# Patient Record
Sex: Female | Born: 1935 | Race: White | Hispanic: No | Smoking: Former smoker
Health system: Southern US, Community
[De-identification: ages and names within clinical notes are randomized; demographics above are authoritative.]

## PROBLEM LIST (undated history)

## (undated) DIAGNOSIS — N8189 Other female genital prolapse: Secondary | ICD-10-CM

## (undated) DIAGNOSIS — I69391 Dysphagia following cerebral infarction: Secondary | ICD-10-CM

## (undated) DIAGNOSIS — I10 Essential (primary) hypertension: Secondary | ICD-10-CM

## (undated) DIAGNOSIS — H3530 Unspecified macular degeneration: Secondary | ICD-10-CM

## (undated) DIAGNOSIS — I639 Cerebral infarction, unspecified: Secondary | ICD-10-CM

## (undated) DIAGNOSIS — R7989 Other specified abnormal findings of blood chemistry: Secondary | ICD-10-CM

## (undated) DIAGNOSIS — Z8673 Personal history of transient ischemic attack (TIA), and cerebral infarction without residual deficits: Secondary | ICD-10-CM

## (undated) DIAGNOSIS — R5381 Other malaise: Secondary | ICD-10-CM

## (undated) DIAGNOSIS — S91009A Unspecified open wound, unspecified ankle, initial encounter: Secondary | ICD-10-CM

## (undated) DIAGNOSIS — R609 Edema, unspecified: Secondary | ICD-10-CM

## (undated) DIAGNOSIS — I2510 Atherosclerotic heart disease of native coronary artery without angina pectoris: Secondary | ICD-10-CM

## (undated) DIAGNOSIS — G459 Transient cerebral ischemic attack, unspecified: Secondary | ICD-10-CM

## (undated) DIAGNOSIS — I5032 Chronic diastolic (congestive) heart failure: Secondary | ICD-10-CM

## (undated) DIAGNOSIS — F329 Major depressive disorder, single episode, unspecified: Secondary | ICD-10-CM

## (undated) DIAGNOSIS — E041 Nontoxic single thyroid nodule: Secondary | ICD-10-CM

## (undated) DIAGNOSIS — S81809A Unspecified open wound, unspecified lower leg, initial encounter: Secondary | ICD-10-CM

## (undated) DIAGNOSIS — R200 Anesthesia of skin: Secondary | ICD-10-CM

## (undated) DIAGNOSIS — I509 Heart failure, unspecified: Secondary | ICD-10-CM

## (undated) DIAGNOSIS — K589 Irritable bowel syndrome without diarrhea: Secondary | ICD-10-CM

## (undated) DIAGNOSIS — I517 Cardiomegaly: Secondary | ICD-10-CM

## (undated) DIAGNOSIS — M25519 Pain in unspecified shoulder: Secondary | ICD-10-CM

## (undated) DIAGNOSIS — F32A Depression, unspecified: Secondary | ICD-10-CM

## (undated) DIAGNOSIS — B028 Zoster with other complications: Secondary | ICD-10-CM

## (undated) DIAGNOSIS — J209 Acute bronchitis, unspecified: Secondary | ICD-10-CM

## (undated) DIAGNOSIS — K449 Diaphragmatic hernia without obstruction or gangrene: Secondary | ICD-10-CM

## (undated) DIAGNOSIS — I219 Acute myocardial infarction, unspecified: Secondary | ICD-10-CM

## (undated) DIAGNOSIS — S81009A Unspecified open wound, unspecified knee, initial encounter: Secondary | ICD-10-CM

## (undated) HISTORY — DX: Acute myocardial infarction, unspecified: I21.9

## (undated) HISTORY — DX: Unspecified macular degeneration: H35.30

## (undated) HISTORY — DX: Chronic diastolic (congestive) heart failure: I50.32

## (undated) HISTORY — DX: Heart failure, unspecified: I50.9

## (undated) HISTORY — DX: Personal history of transient ischemic attack (TIA), and cerebral infarction without residual deficits: Z86.73

## (undated) HISTORY — DX: Transient cerebral ischemic attack, unspecified: G45.9

## (undated) HISTORY — PX: CORONARY ANGIOPLASTY: SHX604

## (undated) HISTORY — DX: Irritable bowel syndrome, unspecified: K58.9

## (undated) HISTORY — DX: Pain in unspecified shoulder: M25.519

## (undated) HISTORY — PX: CARDIAC CATHETERIZATION: SHX172

## (undated) HISTORY — DX: Other female genital prolapse: N81.89

## (undated) HISTORY — DX: Atherosclerotic heart disease of native coronary artery without angina pectoris: I25.10

## (undated) HISTORY — PX: APPENDECTOMY: SHX54

## (undated) HISTORY — PX: CAROTID STENT: SHX1301

## (undated) HISTORY — DX: Cerebral infarction, unspecified: I63.9

## (undated) HISTORY — DX: Depression, unspecified: F32.A

## (undated) HISTORY — DX: Essential (primary) hypertension: I10

## (undated) HISTORY — DX: Diaphragmatic hernia without obstruction or gangrene: K44.9

## (undated) HISTORY — DX: Unspecified open wound, unspecified knee, initial encounter: S81.009A

## (undated) HISTORY — DX: Acute bronchitis, unspecified: J20.9

## (undated) HISTORY — DX: Unspecified open wound, unspecified lower leg, initial encounter: S81.809A

## (undated) HISTORY — DX: Major depressive disorder, single episode, unspecified: F32.9

## (undated) HISTORY — DX: Anesthesia of skin: R20.0

## (undated) HISTORY — DX: Zoster with other complications: B02.8

## (undated) HISTORY — DX: Unspecified open wound, unspecified ankle, initial encounter: S91.009A

## (undated) HISTORY — DX: Other specified abnormal findings of blood chemistry: R79.89

## (undated) HISTORY — DX: Nontoxic single thyroid nodule: E04.1

## (undated) HISTORY — PX: TONSILLECTOMY AND ADENOIDECTOMY: SHX28

## (undated) HISTORY — DX: Dysphagia following cerebral infarction: I69.391

## (undated) HISTORY — DX: Cardiomegaly: I51.7

## (undated) HISTORY — DX: Edema, unspecified: R60.9

## (undated) HISTORY — PX: CATARACT EXTRACTION: SUR2

## (undated) HISTORY — DX: Other malaise: R53.81

---

## 1991-12-23 DIAGNOSIS — B028 Zoster with other complications: Secondary | ICD-10-CM

## 1991-12-23 HISTORY — DX: Zoster with other complications: B02.8

## 1998-06-29 ENCOUNTER — Other Ambulatory Visit: Admission: RE | Admit: 1998-06-29 | Discharge: 1998-06-29 | Payer: Self-pay | Admitting: Obstetrics and Gynecology

## 1998-07-06 ENCOUNTER — Ambulatory Visit (HOSPITAL_COMMUNITY): Admission: RE | Admit: 1998-07-06 | Discharge: 1998-07-06 | Payer: Self-pay | Admitting: Obstetrics & Gynecology

## 1999-07-01 ENCOUNTER — Other Ambulatory Visit: Admission: RE | Admit: 1999-07-01 | Discharge: 1999-07-01 | Payer: Self-pay | Admitting: Obstetrics and Gynecology

## 2000-02-11 ENCOUNTER — Encounter: Admission: RE | Admit: 2000-02-11 | Discharge: 2000-02-11 | Payer: Self-pay | Admitting: Orthopedic Surgery

## 2000-02-11 ENCOUNTER — Encounter: Payer: Self-pay | Admitting: Orthopedic Surgery

## 2000-06-03 ENCOUNTER — Encounter: Payer: Self-pay | Admitting: Emergency Medicine

## 2000-06-04 ENCOUNTER — Encounter: Payer: Self-pay | Admitting: Cardiology

## 2000-06-04 ENCOUNTER — Inpatient Hospital Stay (HOSPITAL_COMMUNITY): Admission: EM | Admit: 2000-06-04 | Discharge: 2000-06-04 | Payer: Self-pay | Admitting: Emergency Medicine

## 2000-07-07 ENCOUNTER — Encounter: Admission: RE | Admit: 2000-07-07 | Discharge: 2000-07-07 | Payer: Self-pay | Admitting: Orthopedic Surgery

## 2000-07-07 ENCOUNTER — Encounter: Payer: Self-pay | Admitting: Orthopedic Surgery

## 2000-08-18 ENCOUNTER — Other Ambulatory Visit: Admission: RE | Admit: 2000-08-18 | Discharge: 2000-08-18 | Payer: Self-pay | Admitting: Obstetrics and Gynecology

## 2001-07-28 ENCOUNTER — Encounter: Admission: RE | Admit: 2001-07-28 | Discharge: 2001-07-28 | Payer: Self-pay | Admitting: Neurology

## 2001-07-28 ENCOUNTER — Encounter: Payer: Self-pay | Admitting: Neurology

## 2001-08-11 ENCOUNTER — Encounter: Payer: Self-pay | Admitting: Neurology

## 2001-08-11 ENCOUNTER — Encounter: Admission: RE | Admit: 2001-08-11 | Discharge: 2001-08-11 | Payer: Self-pay | Admitting: Neurology

## 2001-09-15 ENCOUNTER — Other Ambulatory Visit: Admission: RE | Admit: 2001-09-15 | Discharge: 2001-09-15 | Payer: Self-pay | Admitting: Obstetrics and Gynecology

## 2002-08-01 ENCOUNTER — Encounter: Admission: RE | Admit: 2002-08-01 | Discharge: 2002-08-01 | Payer: Self-pay | Admitting: Obstetrics and Gynecology

## 2002-08-01 ENCOUNTER — Encounter: Payer: Self-pay | Admitting: Obstetrics and Gynecology

## 2002-10-18 ENCOUNTER — Other Ambulatory Visit: Admission: RE | Admit: 2002-10-18 | Discharge: 2002-10-18 | Payer: Self-pay | Admitting: Obstetrics and Gynecology

## 2002-10-25 ENCOUNTER — Encounter: Admission: RE | Admit: 2002-10-25 | Discharge: 2002-10-25 | Payer: Self-pay | Admitting: Obstetrics and Gynecology

## 2002-10-25 ENCOUNTER — Encounter: Payer: Self-pay | Admitting: Obstetrics and Gynecology

## 2003-01-13 ENCOUNTER — Encounter: Payer: Self-pay | Admitting: Obstetrics and Gynecology

## 2003-01-13 ENCOUNTER — Ambulatory Visit (HOSPITAL_COMMUNITY): Admission: RE | Admit: 2003-01-13 | Discharge: 2003-01-13 | Payer: Self-pay | Admitting: Obstetrics and Gynecology

## 2003-03-20 ENCOUNTER — Encounter: Payer: Self-pay | Admitting: Neurology

## 2003-03-20 ENCOUNTER — Encounter: Admission: RE | Admit: 2003-03-20 | Discharge: 2003-03-20 | Payer: Self-pay | Admitting: Neurology

## 2003-04-10 ENCOUNTER — Encounter: Admission: RE | Admit: 2003-04-10 | Discharge: 2003-07-09 | Payer: Self-pay | Admitting: Neurology

## 2003-04-12 ENCOUNTER — Encounter: Payer: Self-pay | Admitting: Neurology

## 2003-04-12 ENCOUNTER — Encounter: Admission: RE | Admit: 2003-04-12 | Discharge: 2003-04-12 | Payer: Self-pay | Admitting: Neurology

## 2003-05-08 ENCOUNTER — Encounter: Admission: RE | Admit: 2003-05-08 | Discharge: 2003-05-08 | Payer: Self-pay | Admitting: Neurology

## 2003-05-08 ENCOUNTER — Encounter: Payer: Self-pay | Admitting: Neurology

## 2003-10-30 ENCOUNTER — Encounter: Admission: RE | Admit: 2003-10-30 | Discharge: 2003-10-30 | Payer: Self-pay | Admitting: Obstetrics and Gynecology

## 2004-11-06 ENCOUNTER — Encounter: Admission: RE | Admit: 2004-11-06 | Discharge: 2004-11-06 | Payer: Self-pay | Admitting: Obstetrics and Gynecology

## 2004-12-22 DIAGNOSIS — R609 Edema, unspecified: Secondary | ICD-10-CM

## 2004-12-22 HISTORY — DX: Edema, unspecified: R60.9

## 2005-01-07 ENCOUNTER — Other Ambulatory Visit: Admission: RE | Admit: 2005-01-07 | Discharge: 2005-01-07 | Payer: Self-pay | Admitting: Obstetrics and Gynecology

## 2005-06-30 ENCOUNTER — Other Ambulatory Visit: Admission: RE | Admit: 2005-06-30 | Discharge: 2005-06-30 | Payer: Self-pay | Admitting: Obstetrics and Gynecology

## 2006-03-18 ENCOUNTER — Encounter: Admission: RE | Admit: 2006-03-18 | Discharge: 2006-03-18 | Payer: Self-pay | Admitting: Cardiology

## 2006-12-22 DIAGNOSIS — Z8673 Personal history of transient ischemic attack (TIA), and cerebral infarction without residual deficits: Secondary | ICD-10-CM

## 2006-12-22 HISTORY — DX: Personal history of transient ischemic attack (TIA), and cerebral infarction without residual deficits: Z86.73

## 2007-04-06 ENCOUNTER — Encounter: Admission: RE | Admit: 2007-04-06 | Discharge: 2007-04-06 | Payer: Self-pay | Admitting: Internal Medicine

## 2007-07-06 ENCOUNTER — Encounter: Admission: RE | Admit: 2007-07-06 | Discharge: 2007-07-06 | Payer: Self-pay | Admitting: Internal Medicine

## 2007-09-14 ENCOUNTER — Ambulatory Visit (HOSPITAL_COMMUNITY): Admission: RE | Admit: 2007-09-14 | Discharge: 2007-09-14 | Payer: Self-pay | Admitting: Internal Medicine

## 2008-12-04 ENCOUNTER — Encounter: Admission: RE | Admit: 2008-12-04 | Discharge: 2008-12-04 | Payer: Self-pay | Admitting: Cardiology

## 2009-12-12 ENCOUNTER — Emergency Department (HOSPITAL_COMMUNITY): Admission: EM | Admit: 2009-12-12 | Discharge: 2009-12-12 | Payer: Self-pay | Admitting: Emergency Medicine

## 2010-12-22 DIAGNOSIS — H3530 Unspecified macular degeneration: Secondary | ICD-10-CM

## 2010-12-22 HISTORY — DX: Unspecified macular degeneration: H35.30

## 2011-02-19 ENCOUNTER — Inpatient Hospital Stay (HOSPITAL_COMMUNITY)
Admission: EM | Admit: 2011-02-19 | Discharge: 2011-02-21 | DRG: 303 | Disposition: A | Discharge status: Home / Self Care | Payer: Medicare Other | Attending: Internal Medicine | Admitting: Internal Medicine

## 2011-02-19 DIAGNOSIS — J45909 Unspecified asthma, uncomplicated: Secondary | ICD-10-CM | POA: Diagnosis present

## 2011-02-19 DIAGNOSIS — I2510 Atherosclerotic heart disease of native coronary artery without angina pectoris: Principal | ICD-10-CM | POA: Diagnosis present

## 2011-02-19 DIAGNOSIS — R079 Chest pain, unspecified: Secondary | ICD-10-CM

## 2011-02-19 DIAGNOSIS — I5032 Chronic diastolic (congestive) heart failure: Secondary | ICD-10-CM | POA: Diagnosis present

## 2011-02-19 DIAGNOSIS — Z9861 Coronary angioplasty status: Secondary | ICD-10-CM

## 2011-02-19 DIAGNOSIS — Z79899 Other long term (current) drug therapy: Secondary | ICD-10-CM

## 2011-02-19 DIAGNOSIS — I209 Angina pectoris, unspecified: Secondary | ICD-10-CM | POA: Diagnosis present

## 2011-02-19 DIAGNOSIS — E785 Hyperlipidemia, unspecified: Secondary | ICD-10-CM | POA: Diagnosis present

## 2011-02-19 DIAGNOSIS — Z7982 Long term (current) use of aspirin: Secondary | ICD-10-CM

## 2011-02-19 DIAGNOSIS — I10 Essential (primary) hypertension: Secondary | ICD-10-CM | POA: Diagnosis present

## 2011-02-20 ENCOUNTER — Emergency Department (HOSPITAL_COMMUNITY): Payer: Medicare Other

## 2011-02-20 LAB — TROPONIN I: Troponin I: <0.01 ng/mL (ref 0.00–0.06)

## 2011-02-20 LAB — LIPID PANEL
Cholesterol: 192 mg/dL (ref 0–200)
HDL: 61 mg/dL (ref >39)
LDL Cholesterol: 109 mg/dL — ABNORMAL HIGH (ref 0–99)
Triglycerides: 109 mg/dL (ref <150)
VLDL: 22 mg/dL (ref 0–40)

## 2011-02-20 LAB — CBC
Hemoglobin: 10.7 g/dL — ABNORMAL LOW (ref 12.0–15.0)
MCH: 26.2 pg (ref 26.0–34.0)
MCHC: 31.4 g/dL (ref 30.0–36.0)
MCV: 83.6 fL (ref 78.0–100.0)
Platelets: 355 10*3/uL (ref 150–400)
RBC: 4.08 MIL/uL (ref 3.87–5.11)
RDW: 15.1 % (ref 11.5–15.5)

## 2011-02-20 LAB — BASIC METABOLIC PANEL
BUN: 16 mg/dL (ref 6–23)
CO2: 26 mEq/L (ref 19–32)
Calcium: 9 mg/dL (ref 8.4–10.5)
Creatinine, Ser: 0.83 mg/dL (ref 0.4–1.2)
GFR calc Af Amer: >60 mL/min (ref >60)
GFR calc non Af Amer: >60 mL/min (ref >60)
Glucose, Bld: 110 mg/dL — ABNORMAL HIGH (ref 70–99)
Sodium: 138 mEq/L (ref 135–145)

## 2011-02-20 LAB — CARDIAC PANEL(CRET KIN+CKTOT+MB+TROPI)
CK, MB: 2.5 ng/mL (ref 0.3–4.0)
Total CK: 95 U/L (ref 7–177)
Total CK: 106 U/L (ref 7–177)
Troponin I: <0.01 ng/mL (ref 0.00–0.06)

## 2011-02-20 LAB — PROTIME-INR
INR: 0.93 (ref 0.00–1.49)
Prothrombin Time: 12.7 seconds (ref 11.6–15.2)

## 2011-02-20 LAB — HEMOGLOBIN A1C
Hgb A1c MFr Bld: 6.7 % — ABNORMAL HIGH (ref <5.7)
Mean Plasma Glucose: 146 mg/dL — ABNORMAL HIGH (ref <117)

## 2011-02-20 LAB — APTT: aPTT: 29 seconds (ref 24–37)

## 2011-02-20 LAB — HEPARIN LEVEL (UNFRACTIONATED): Heparin Unfractionated: 0.33 IU/mL (ref 0.30–0.70)

## 2011-02-20 LAB — CK TOTAL AND CKMB (NOT AT ARMC)
Relative Index: INVALID (ref 0.0–2.5)
Total CK: 91 U/L (ref 7–177)

## 2011-02-20 LAB — BRAIN NATRIURETIC PEPTIDE: Pro B Natriuretic peptide (BNP): 167 pg/mL — ABNORMAL HIGH (ref 0.0–100.0)

## 2011-02-20 LAB — MAGNESIUM: Magnesium: 2.5 mg/dL (ref 1.5–2.5)

## 2011-02-21 LAB — CBC
HCT: 32.5 % — ABNORMAL LOW (ref 36.0–46.0)
MCV: 84.4 fL (ref 78.0–100.0)
Platelets: 364 10*3/uL (ref 150–400)
RBC: 3.85 MIL/uL — ABNORMAL LOW (ref 3.87–5.11)
WBC: 8.7 10*3/uL (ref 4.0–10.5)

## 2011-02-21 LAB — BASIC METABOLIC PANEL
BUN: 16 mg/dL (ref 6–23)
Chloride: 102 mEq/L (ref 96–112)
Potassium: 3.5 mEq/L (ref 3.5–5.1)
Sodium: 139 mEq/L (ref 135–145)

## 2011-02-24 NOTE — Discharge Summary (Signed)
NAME:  Sonya [name], Sonya [name] NO.:  [id number]  MEDICAL RECORD NO.:  [id number]           PATIENT TYPE:  I  LOCATION:  2019                         FACILITY:  [facility]  PHYSICIAN:  Jake Bathe, MD      DATE OF BIRTH:  11/17/1913  DATE OF ADMISSION:  02/19/2011 DATE OF DISCHARGE:  02/21/2011                              DISCHARGE SUMMARY   PRIMARY CARE PHYSICIAN:  Thora Lance, MD  CARDIOLOGIST:  Jake Bathe, MD  FINAL DIAGNOSES: 1. Angina. 2. Coronary artery disease. 3. Hyperlipidemia - intolerant to statins. 4. History of diastolic heart failure. 5. History of asthma.  ALLERGIES:  PLAVIX and STATINS.  DISCHARGE MEDICATIONS: 1. Isosorbide mononitrate or Imdur 30 mg once a day - new medication. 2. Nitroglycerin 0.4 mg p.r.n. 3. Advair Diskus 250/50 twice a day. 4. Aspirin 81 mg a day. 5. Calcium carbonate 1 tablet daily. 6. Coenzyme Q10. 7. Diltiazem CD 180 mg once a day. 8. Fluoxetine 20 mg 2 tablets a day. 9. Lasix 40 mg twice a day. 10.Loteprednol 0.5% eye drops in left eye twice a day. 11.Metoprolol tartrate 25 mg one-half tablet by mouth twice a day. 12.Multivitamins.  DISCHARGE LABS:  Sodium 135, potassium 3.5, BUN 16, creatinine 0.9. White count 8.7, hemoglobin 10.3, hematocrit 32.5, platelets 364. Cardiac panel negative x3.  Hemoglobin A1c 6.7.  LDL cholesterol 109, HDL 61.  BNP 167.  EKG showed sinus rhythm rate 60 with T-wave inversion/subtle ST-segment depression in V2, V3, V4 as well as III and aVF.  Prior cardiac catheterization from June 2001 showed widely patent stent at first diagonal branch with 30% left circumflex narrowing. Normal ejection fraction.  Chest x-ray was unremarkable.  BRIEF HOSPITAL COURSE:  A 77 year old female who was admitted from [facility] with a history of coronary artery disease status post bare- metal stent to diagonal branch who was having progressive chest discomfort.  Symptoms first started in December and  had been progressive over the past 4 weeks.  Somewhat exertional, relieved by rest.  She ran out of nitroglycerin and this worried her and she went to the emergency room for further evaluation.  Since here in the hospital, she has been chest pain free and she was started on isosorbide mononitrate 30 mg once a day.  As of note, she does complain of some tenderness to touch in her lower extremities, which may be neuropathic pain.  Physical exam on discharge shows heart regular rate and rhythm with a soft systolic murmur.  Lungs are clear.  Extremities show no significant edema.  She did not wish to take statin medication.  She did not tolerate nor did she tolerate Plavix.  She is ready for discharge currently.  Eating well.  FOLLOWUP:  She has followup with me on February 28, 2011.  She knows to contact me sooner if any worrisome symptoms develop.  Given her advanced age of 20, we did have a long discussion during the hospitalization with her daughter about invasive versus noninvasive management and we will continue with noninvasive/aggressive medical management.  She understands the increased risk of cardiac catheterization.  DISCHARGE TIME:  Spent 35 minutes with med reconciliation, lab review, medical records review, patient education.     Jake Bathe, MD     MCS/MEDQ  D:  02/21/2011  T:  02/21/2011  Job:  [id number]  Electronically Signed by Donato Schultz MD on 02/24/2011 07:43:14 AM

## 2011-02-28 NOTE — H&P (Signed)
NAME:  Sonya [name], Sonya [name] NO.:  [id number]  MEDICAL RECORD NO.:  [id number]           PATIENT TYPE:  E  LOCATION:  [facility]                         FACILITY:  [facility]  PHYSICIAN:  Wendi Snipes, MD DATE OF BIRTH:  July 22, 1913  DATE OF ADMISSION:  02/20/2011 DATE OF DISCHARGE:                             HISTORY & PHYSICAL   PRIMARY DOCTOR:  Dr. Valentina Lucks.  CARDIOLOGIST:  Mark C. Anne Fu, MD  CHIEF COMPLAINT:  Chest pain.  HISTORY OF PRESENT ILLNESS:  This is a 77 year old white female with a history of coronary artery disease, status post bare-metal stent to diagonal, who presents with progressive chest pain.  She states that she was recently seen in the office by Dr. Anne Fu for exertional angina and medical therapy was recommended at that time.  Her symptoms first started in December 2011, though over the past 4 weeks have been steadily progressive.  She describes typical angina with exertionalchest pressure and shortness of breath that is relieved by rest.  She now occasionally has chest pain at rest.  She ran out of nitroglycerin this evening which prompted her visit to the emergency department, and those symptoms began at approximately 6:30 p.m. this evening.  PAST MEDICAL HISTORY: 1. Coronary artery disease, status post PCI of diagonal. 2. Hypertension. 3. Diastolic heart failure. 4. Asthma.  ALLERGIES:  No known drug allergies.  MEDICATIONS ON ADMISSION: 1. Multivitamin daily. 2. Lasix 40 mg twice daily. 3. Metoprolol 12.5 twice daily. 4. Prozac 10 mg daily. 5. Nitroglycerin as needed. 6. Cardizem 180 mg daily. 7. Advair twice daily. 8. Prilosec 40 grams daily. 9. Aspirin 81 mg daily. 10.Calcium 600 plus D daily. 11.Co-Q-10 daily. 12.Lotemax ophthalmic daily.  SOCIAL HISTORY:  She lives in [place] at [facility] alone.  She owns a [employer].  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  FAMILY HISTORY:  Negative for premature coronary artery disease.  REVIEW OF SYSTEMS:  All 14 systems were reviewed, were negative except as mentioned in detail in the HPI.  PHYSICAL EXAMINATION:  VITAL SIGNS:  Blood pressure is 110/53, respiratory rate is 16, pulse is 63.  She is satting 100% on 2 liters nasal cannula. GENERAL:  She is a 77 year old elderly female, appearing younger than stated age. HEENT:  Moist mucous membranes.  Pupils equal, round, reactive to light and accommodation.  Anicteric sclerae. NECK:  No jugular venous distention.  No thyromegaly. CARDIOVASCULAR:  Regular rate and rhythm.  No murmurs, rubs, or gallops. LUNGS: Clear to auscultation bilaterally. ABDOMEN:  Nontender, nondistended.  Positive bowel sounds.  No masses. EXTREMITIES: 1 to 2+ edema to the shins. NEUROLOGIC:  Alert and oriented x3.  Cranial nerves II-XII grossly intact.  No focal neurologic deficits. SKIN:  Warm, dry, intact.  No rashes. PSYCHIATRIC:  Mood and affect are appropriate.  RADIOLOGY STUDIES:  Chest x-ray showed vascular congestion with bibasilar atelectasis.  EKG showed normal sinus rhythm with a rate of 68 beats per minute with diffuse T-wave inversions and slight ST depressions in lateral leads.  LABORATORY DATA REVIEW:  White blood cell count 9.8, hematocrit is 34.1. Potassium is 3.9 and creatinine is 0.8.  Her troponin is less than 0.01, her BNP is 167.  ASSESSMENT/PLAN:  This is a 77 year old white female with history of coronary artery disease, status post percutaneous coronary intervention, here with symptoms consistent with unstable angina. 1. Unstable angina.  She has concerning symptoms, likely represents a     critical stenosis.  Consider an early invasive strategy in context     of her excellent functional status.  We will start her on heparin     drip and continue her aspirin and cycle cardiac enzymes.  She is     chest pain-free now; however,  continue her nitroglycerin drip at 10     mg per minute. 2. Hypertension.  We will continue current medications.  Her blood     pressure is currently at goal. 3. Diastolic heart failure.  She is euvolemic and we will continue her     current Lasix dosing.     Wendi Snipes, MD     BHH/MEDQ  D:  02/20/2011  T:  02/20/2011  Job:  [id number]  Electronically Signed by Jim Desanctis MD on 02/28/2011 12:53:29 PM

## 2011-03-07 ENCOUNTER — Inpatient Hospital Stay (HOSPITAL_COMMUNITY)
Admission: EM | Admit: 2011-03-07 | Discharge: 2011-03-12 | DRG: 249 | Disposition: A | Discharge status: Home Health | Payer: Medicare Other | Attending: Interventional Cardiology | Admitting: Interventional Cardiology

## 2011-03-07 DIAGNOSIS — Z7982 Long term (current) use of aspirin: Secondary | ICD-10-CM

## 2011-03-07 DIAGNOSIS — J209 Acute bronchitis, unspecified: Secondary | ICD-10-CM | POA: Diagnosis not present

## 2011-03-07 DIAGNOSIS — I10 Essential (primary) hypertension: Secondary | ICD-10-CM | POA: Diagnosis present

## 2011-03-07 DIAGNOSIS — I2119 ST elevation (STEMI) myocardial infarction involving other coronary artery of inferior wall: Principal | ICD-10-CM | POA: Diagnosis present

## 2011-03-07 DIAGNOSIS — J45909 Unspecified asthma, uncomplicated: Secondary | ICD-10-CM | POA: Diagnosis present

## 2011-03-07 DIAGNOSIS — Z79899 Other long term (current) drug therapy: Secondary | ICD-10-CM

## 2011-03-07 DIAGNOSIS — I219 Acute myocardial infarction, unspecified: Secondary | ICD-10-CM

## 2011-03-07 DIAGNOSIS — I2510 Atherosclerotic heart disease of native coronary artery without angina pectoris: Secondary | ICD-10-CM | POA: Diagnosis present

## 2011-03-07 DIAGNOSIS — Z66 Do not resuscitate: Secondary | ICD-10-CM | POA: Diagnosis not present

## 2011-03-07 DIAGNOSIS — I5032 Chronic diastolic (congestive) heart failure: Secondary | ICD-10-CM | POA: Diagnosis present

## 2011-03-07 HISTORY — DX: Acute myocardial infarction, unspecified: I21.9

## 2011-03-07 LAB — POCT I-STAT, CHEM 8
BUN: 17 mg/dL (ref 6–23)
Chloride: 105 mEq/L (ref 96–112)
Creatinine, Ser: 1.1 mg/dL (ref 0.4–1.2)
Sodium: 138 mEq/L (ref 135–145)
TCO2: 21 mmol/L (ref 0–100)

## 2011-03-07 LAB — DIFFERENTIAL
Basophils Relative: 0 % (ref 0–1)
Lymphocytes Relative: 19 % (ref 12–46)
Lymphs Abs: 1.8 10*3/uL (ref 0.7–4.0)
Monocytes Absolute: 1.1 10*3/uL — ABNORMAL HIGH (ref 0.1–1.0)
Monocytes Relative: 11 % (ref 3–12)
Neutro Abs: 6.5 10*3/uL (ref 1.7–7.7)

## 2011-03-07 LAB — CBC
HCT: 35 % — ABNORMAL LOW (ref 36.0–46.0)
Hemoglobin: 11.3 g/dL — ABNORMAL LOW (ref 12.0–15.0)
MCH: 27.3 pg (ref 26.0–34.0)
MCHC: 32.3 g/dL (ref 30.0–36.0)
MCV: 84.5 fL (ref 78.0–100.0)
RBC: 4.14 MIL/uL (ref 3.87–5.11)

## 2011-03-07 LAB — MRSA PCR SCREENING: MRSA by PCR: NEGATIVE

## 2011-03-07 LAB — CARDIAC PANEL(CRET KIN+CKTOT+MB+TROPI)
Relative Index: 7.5 — ABNORMAL HIGH (ref 0.0–2.5)
Total CK: 259 U/L — ABNORMAL HIGH (ref 7–177)
Troponin I: 9.01 ng/mL (ref 0.00–0.06)

## 2011-03-07 LAB — BASIC METABOLIC PANEL
BUN: 13 mg/dL (ref 6–23)
CO2: 23 mEq/L (ref 19–32)
Calcium: 8.8 mg/dL (ref 8.4–10.5)
Chloride: 104 mEq/L (ref 96–112)
Creatinine, Ser: 0.84 mg/dL (ref 0.4–1.2)
Glucose, Bld: 160 mg/dL — ABNORMAL HIGH (ref 70–99)

## 2011-03-08 LAB — CARDIAC PANEL(CRET KIN+CKTOT+MB+TROPI)
CK, MB: 8 ng/mL (ref 0.3–4.0)
Relative Index: 7.5 — ABNORMAL HIGH (ref 0.0–2.5)
Relative Index: 6.2 — ABNORMAL HIGH (ref 0.0–2.5)
Troponin I: 3.95 ng/mL (ref 0.00–0.06)

## 2011-03-08 LAB — BASIC METABOLIC PANEL
BUN: 18 mg/dL (ref 6–23)
Calcium: 8.5 mg/dL (ref 8.4–10.5)
GFR calc non Af Amer: >60 mL/min (ref >60)
Glucose, Bld: 93 mg/dL (ref 70–99)
Potassium: 4 mEq/L (ref 3.5–5.1)
Sodium: 137 mEq/L (ref 135–145)

## 2011-03-08 LAB — CBC
HCT: 31.3 % — ABNORMAL LOW (ref 36.0–46.0)
MCHC: 31 g/dL (ref 30.0–36.0)
MCV: 84.4 fL (ref 78.0–100.0)
Platelets: 326 10*3/uL (ref 150–400)
RDW: 15.6 % — ABNORMAL HIGH (ref 11.5–15.5)
WBC: 8 10*3/uL (ref 4.0–10.5)

## 2011-03-09 LAB — CARDIAC PANEL(CRET KIN+CKTOT+MB+TROPI)
CK, MB: 5.1 ng/mL — ABNORMAL HIGH (ref 0.3–4.0)
Relative Index: 4.8 — ABNORMAL HIGH (ref 0.0–2.5)
Total CK: 107 U/L (ref 7–177)

## 2011-03-10 ENCOUNTER — Inpatient Hospital Stay (HOSPITAL_COMMUNITY): Payer: Medicare Other

## 2011-03-10 DIAGNOSIS — J449 Chronic obstructive pulmonary disease, unspecified: Secondary | ICD-10-CM

## 2011-03-10 DIAGNOSIS — J209 Acute bronchitis, unspecified: Secondary | ICD-10-CM

## 2011-03-10 LAB — BASIC METABOLIC PANEL
BUN: 15 mg/dL (ref 6–23)
CO2: 25 mEq/L (ref 19–32)
Calcium: 8.7 mg/dL (ref 8.4–10.5)
Creatinine, Ser: 0.72 mg/dL (ref 0.4–1.2)
GFR calc non Af Amer: >60 mL/min (ref >60)
Glucose, Bld: 126 mg/dL — ABNORMAL HIGH (ref 70–99)

## 2011-03-10 LAB — CBC
MCH: 27.2 pg (ref 26.0–34.0)
MCHC: 31.8 g/dL (ref 30.0–36.0)
Platelets: 340 10*3/uL (ref 150–400)
RDW: 15.6 % — ABNORMAL HIGH (ref 11.5–15.5)

## 2011-03-10 LAB — POCT ACTIVATED CLOTTING TIME
Activated Clotting Time: 193 seconds
Activated Clotting Time: 140 seconds

## 2011-03-10 LAB — BRAIN NATRIURETIC PEPTIDE: Pro B Natriuretic peptide (BNP): 446 pg/mL — ABNORMAL HIGH (ref 0.0–100.0)

## 2011-03-11 LAB — CBC
HCT: 33.2 % — ABNORMAL LOW (ref 36.0–46.0)
Hemoglobin: 10.6 g/dL — ABNORMAL LOW (ref 12.0–15.0)
MCH: 27 pg (ref 26.0–34.0)
MCHC: 31.9 g/dL (ref 30.0–36.0)
MCV: 84.5 fL (ref 78.0–100.0)
RDW: 15.6 % — ABNORMAL HIGH (ref 11.5–15.5)

## 2011-03-11 LAB — BASIC METABOLIC PANEL
CO2: 28 mEq/L (ref 19–32)
Calcium: 8.6 mg/dL (ref 8.4–10.5)
Creatinine, Ser: 0.84 mg/dL (ref 0.4–1.2)
GFR calc Af Amer: >60 mL/min (ref >60)
GFR calc non Af Amer: >60 mL/min (ref >60)
Glucose, Bld: 90 mg/dL (ref 70–99)

## 2011-03-16 NOTE — Consult Note (Signed)
NAME:  Sonya [name], Sonya [name] NO.:  [id number]  MEDICAL RECORD NO.:  [id number]           PATIENT TYPE:  I  LOCATION:  2033                         FACILITY:  [facility]  PHYSICIAN:  Kalman Shan, MD   DATE OF BIRTH:  Dec 01, 1913  DATE OF CONSULTATION: DATE OF DISCHARGE:                                CONSULTATION   CONSULT REQUESTED BY:  Mark C. Anne Fu, MD, [facility].  CONSULT FOR:  History of wheezing, shortness of breath, and yellow sputum.  HISTORY OF PRESENT ILLNESS:  Sonya [name] is a pleasant, very functional 77 year old female who was admitted on March 07, 2011, with chest pain, found to have inferoposterior MI and a status post stent placement.  She has been placed on aspirin and Plavix therapy.  However, she is having some wheezing and yellow sputum.  Therefore, Pulmonary has been called.  History dates back to history of several years of chronic insidious onset dyspnea.  She does not really clarify to me whether it has been progressive or not, but I suspect it has been stable.  It is unclear what the aggravating and relieving factors are, unclear what the associated factors are.  However, it seems like she might have some associated chronic cough.  She was started on Advair therapy several years ago or so with partial improvement, was given a history of asthma, although this is not certain that she had pulmonary function tests.  For the last 2 months, she has been having intermittent chest heaviness that happens randomly, but particularly more so with exertion when she uses a walker.  A week or so prior to admission, she did see a primary care physician for this, this was at that point was considered as acid reflux versus an asthma exacerbation and was given a nebulizer treatment in the office.  However, she continued to have these symptoms and then on March 07, 2011, she had an acute worsening associated with new with nausea and diaphoresis and  left arm radiation of pain.  This resulted in the admission and diagnosis of MI.  Since the stent placement, she feels these symptoms have resolved.  However, Dr. Anne Fu, reports a history of wheezing, patient is not sure when the wheezing started, whether it predated or postdated at admission, but it is certain from her and from Dr. Anne Fu that this morning on March 10, 2011, that she did have some yellow sputum when she coughed and this is new, although she does not feel sick and denies any worsening shortness of breath, hemoptysis, edema, chest pain, syncope, nausea, vomiting, diarrhea, or hemoptysis.  PAST MEDICAL HISTORY: 1. Coronary artery disease. 2. Hypertension, on Lopressor, but not on ACE inhibitors. 3. Diastolic heart failure. 4. History of asthma on Advair.  SOCIAL HISTORY:  She is widowed.  No children.  No grandchildren.  She used to smoke, but quit in the 1970s, unclear how much she smoked.  She lives in [facility], very functional.  She was born in [place], moved to [place] in 1954.  She lost her fiancee in the second World War and then remarried, but currently widowed.  FAMILY HISTORY:  Early coronary artery disease has a sister who lives in [place], details not known.  CURRENT MEDICATIONS:  Albuterol, aspirin, Plavix, Lovenox for DVT prophylaxis, Advair, Lasix, isosorbide mononitrate, Lopressor, multivitamins, Protonix, and nitroglycerin p.r.n.  Of note, the Lasix was initiated today after the history of wheezing and the yellow sputum.  ALLERGIES:  No known drug allergies.  REVIEW OF SYSTEMS:  Chest pain, anxiety, nausea, vomiting at admission, but currently yellow sputum.  PHYSICAL EXAMINATION:  GENERAL:  An elderly female, looks very pleasant younger than her stated age. CENTRAL NERVOUS SYSTEM:  Alert and oriented x3.  Glasgow Coma scale 15. Moves all four extremities, reached normal. PSYCHIATRIC:  Pleasant, cooperative, very good historian and  cognitively intact. HEENT:  Pupils equal and reactive to light.  Normal sclera.  No neck nodes.  No elevated JVP.  Supple neck. RESPIRATORY:  Trachea central, air entry equal on both sides.  No wheezing or crackles.  Normal breath sounds.  CARDIOVASCULAR:  Regular rate and rhythm.  S1 and S2. EXTREMITIES:  No cyanosis, no clubbing, no pedal edema. SKIN:  Intact. MUSCULOSKELETAL:  Chronic osteoarthritis, she is using a walker.  LABORATORY DATA: 1. Chest x-ray today, March 10, 2011, is pending, but chest x-ray on     February 20, 2011, 19 days ago shows just peribronchial thickening. 2. Lab values, troponin on February 07, 2011, is 2.9. 3. BMET on February, 17, 2012, creatinine 0.8. 4. MRSA PCR, done on March 07, 2011, negative. 5. CBC, March 08, 2011, white count 8, hemoglobin 9.7, platelet count     326.  ASSESSMENT AND PLAN: 1. Acute bronchitis. 2. Clinically, no evidence of pneumonia. 3. History of asthma, but unclear if she is at baseline pulmonary     function tests and history of chronic Advair therapy.  I am not sure how certain the diagnosis of asthma is, I wonder if lot of the symptoms that she was having was related to her coronary artery disease.  At this point, it is very difficult to sort this out.  PLAN: 1. P.o. doxycycline 100 mg p.o. b.i.d. after meals for 5 days for     acute bronchitis. 2. Get simple spirometry. 3. Get basic labs. 4. We will monitor response to above therapy. 5. Will need outpatient followup with Dr. Trula Slade, Pulmonary     Critical Care.     Kalman Shan, MD     MR/MEDQ  D:  03/10/2011  T:  03/11/2011  Job:  [id number]  cc:   Thora Lance, M.D. Jake Bathe, MD  Electronically Signed by Kalman Shan MD on 03/16/2011 08:58:26 PM

## 2011-03-17 NOTE — Discharge Summary (Signed)
NAME:  Sonya [name], HEAD NO.:  [id number]  MEDICAL RECORD NO.:  [id number]           PATIENT TYPE:  LOCATION:                                 FACILITY:  PHYSICIAN:  Jake Bathe, MD      DATE OF BIRTH:  October 15, 1913  DATE OF ADMISSION:  03/07/2011 DATE OF DISCHARGE:                              DISCHARGE SUMMARY   DATE OF DISCHARGE:  Anticipated is March 12, 2011.  PRIMARY CARE PHYSICIAN:  Thora Lance, M.D.  CARDIOLOGIST:  Jake Bathe, MD.  PULMONOLOGIST:  Kalman Shan, MD with [facility]  FINAL DIAGNOSES: 1. Inferior/posterior cute myocardial infarction - ST-elevation     myocardial infarction. 2. Coronary artery disease - cardiac catheterization took place, bare-     metal stent right coronary artery mid 3.0 x 15 mm and a 3.0 x 30     mm.  Echocardiogram EF 50-55% with moderate inferoposterior wall     hypokinesis, mitral annular calcification, mild mitral     regurgitation. 3. Acute bronchitis - appreciate consultation by Dr. Marchelle Gearing of     [facility] for wheezing, shortness of breath, and yellow     productive sputum.  P.o. doxycycline 100 mg twice a day was given     for a total of 5 days.  Spirometry was performed, results pending.     She will be seeing him as an outpatient.  She has a chronic history     of "asthma."  She has been on Advair at home.  Prior to admission,     she was having increased wheezing.  Part of this may have been from     increased left atrial filling pressures as well.  No recent smoking     history.  No ACE inhibitors currently. 4. Chronic diastolic heart failure - elevated left atrial filling     pressures, on Lasix.  A few months ago, she was with lower     extremity edema, which has markedly improved with Lasix     administration.  During this hospitalization, IV Lasix 40 mg was     administered x1.  Continued with p.o. Lasix administration.  DISCHARGE MEDICATIONS:  Only new medicines are  clopidogrel 75 mg once a day to be taken for at least 1 month but optimally 1 year, aspirin 325 mg once a day, albuterol inhaler 2 puffs q.6 h. as needed, and doxycycline 100 mg twice a day before meals for 5 days.  She is continuing with her Advair 250/50 1 puff twice a day, diltiazem CD/XD 180 mg in the evening, fluoxetine 10 mg 2 capsules every morning, over- the-counter vitamins, isosorbide mononitrate 30 mg every morning, Lasix 40 mg twice a day, Lidoderm patch as needed q.12 h., Lotemax 0.5% eyedrops twice a day in left eye, metoprolol tartrate 12.5 mg p.o. twice a day, pantoprazole 40 mg once a day, and nitroglycerin 0.4 mg p.r.n. chest pain.  HOSPITAL COURSE:  She was admitted with inferior-posterior STEMI with inferior ST-elevation with ST depression in V1 and V2.  Prior to admission, she was having bouts of intermittent chest  pain as well as hoarseness.  She was seen previously by Lavinia Sharps in her primary physician's office for wheezing 2 days prior to this admission.  She is very active, owns a [employer], still works there.  When she was in the emergency room, her ST segments were still elevated, but she felt symptomatically better.  After long discussion, it was decided upon given her overall well being take her to the Cardiac Catheterization Lab.  Dr. Eldridge Dace performed the procedure and successfully placed 2 bare-metal stents in her right coronary artery.  The LAD was a large vessel with moderate midvessel disease of up to 60-70%, hazy.  Obtuse marginal 1 ostium had a 90% stenosis and there was a 25% midvessel lesion just before OM1.  The culprit vessel was treated.  While she was here over the weekend, she continued with her wheezing and cough which became productive of yellow sputum.  Chest x-ray was performed which showed no evidence of pneumonia.  PFTs were performed, results pending.  Dr. Marchelle Gearing  with [facility] consulted graciously and suggested  doxycycline for 5 days.  Prescription has been given.  Her wheezing certainly could be multifactorial both from a bronchitis perspective as well as acute on chronic diastolic heart failure.  Overall, her ejection fraction was reassuring in the 50% range.  Her inferior posterior wall was hypokinetic and her left atrial filling pressures were estimated elevated.  Continuation of Lasix.  On day prior to discharge, she was feeling well, ambulating in the hallway.  She did have 2 bouts of emesis, which apparently are posttussive.  In all, she has remained very pleasant and with physical therapy's assistance, she ambulated well and they recommended a 3 to 4- wheel walker, which I have written a prescription for.  Eventually, she should be able to graduate to a cane.  In addition, they did recommend home health PT, which I have written a prescription for.  She was in independent living at [facility] and now she will be in the assisted living portion for the time being until she is strong enough to resume independent living.  FOLLOWUP:  She has a followup with me on March 29, Thursday at 3:15 p.m. I have also given her phone number for [facility] to make a return visit appointment and she also has an appointment scheduled with Dr. Valentina Lucks as previously scheduled.  She knows to watch for any worsening symptoms and call us immediately with any issues.  I have reviewed her medicines with her and reviewed medical records.  PERTINENT LABS:  BNP of 394 on discharge, potassium 4.0, creatinine 0.8. White count of 8.2, hemoglobin 10.6, platelet of 356.  Her peak CK-MB was 19.3 and peak troponin was 9.01.  Total time spent on discharge 35 minutes counselling the patient, med reconciliation, review of labs, and prescriptions.     Jake Bathe, MD     MCS/MEDQ  D:  03/11/2011  T:  03/12/2011  Job:  [id number]  cc:   Thora Lance, M.D. Kalman Shan, MD  Electronically Signed by  Donato Schultz MD on 03/17/2011 02:34:50 PM

## 2011-04-07 ENCOUNTER — Encounter: Payer: Self-pay | Admitting: Internal Medicine

## 2011-04-08 ENCOUNTER — Encounter: Payer: Self-pay | Admitting: Internal Medicine

## 2011-04-09 ENCOUNTER — Institutional Professional Consult (permissible substitution): Payer: Medicare Other | Admitting: Internal Medicine

## 2011-04-09 NOTE — H&P (Signed)
  NAME:  Sonya [name], Sonya [name] NO.:  [id number]  MEDICAL RECORD NO.:  [id number]           PATIENT TYPE:  I  LOCATION:  2910                         FACILITY:  [facility]  PHYSICIAN:  Corky Crafts, MDDATE OF BIRTH:  [birth year]  DATE OF ADMISSION:  03/07/2011 DATE OF DISCHARGE:                             HISTORY & PHYSICAL   PRIMARY CARDIOLOGIST:  Jake Bathe, MD  PRIMARY CARE PHYSICIAN:  Thora Lance, MD  REASON FOR ADMISSION:  Acute inferoposterior MI.  HISTORY OF PRESENT ILLNESS:  The patient is a 77 year old woman who has known coronary artery disease.  She has had intermittent chest pain and thinks she had her most recent bout of pain starting at about 11:30.  It did not go away, so she came to the emergency room via EMS.  ECG shows acute inferoposterior MI.  Currently, she feels like the pain is better. She has not had any arrhythmia or blood pressure issues.  She is very active.  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  She lives at [facility] and functions independently.  PAST MEDICAL HISTORY: 1. Coronary artery disease. 2. Hypertension. 3. Diastolic heart failure. 4. Asthma.  SOCIAL HISTORY:  As above.  She owns [employer].  She does not smoke. She lives in [facility].  FAMILY HISTORY:  Early coronary artery disease.  ALLERGIES:  No known drug allergies.  HOME MEDICATIONS: 1. Imdur 30 mg daily. 2. Nitroglycerin 0.4 p.r.n. 3. Advair. 4. Aspirin 81 mg daily. 5. Calcium. 6. Coenzyme Q10. 7. Diltiazem CD 180 mg daily. 8. Fluoxetine 40 mg day. 9. Lasix 40 mg b.i.d. 10.Metoprolol 12.5 mg b.i.d. 11.Loteprednol eye drops. 12.Multivitamins.  REVIEW OF SYSTEMS:  Significant for the chest pain and anxiety.  No active bleeding problems.  PHYSICAL EXAMINATION:  VITAL SIGNS:  Blood pressure 140/72 and pulse 80. GENERAL:  She is awake and alert. HEAD:  Normocephalic and atraumatic. CARDIOVASCULAR:  Regular rate and rhythm.   S1 and S2. LUNGS:  No wheezing. EXTREMITIES:  2+ right groin pulse.  ECG shows normal sinus rhythm with inferior ST elevation.  LABORATORY DATA:  Pending.  Chest x-ray pending.  ASSESSMENT/PLAN:  This is a 77 year old with acute inferoposterior myocardial infarction.  She will need aspirin and likely Plavix along with a statin.  Further plans will be based on her cath.  She will need to continue beta-blocker if possible if her heart rate allows.     Corky Crafts, MD     JSV/MEDQ  D:  03/07/2011  T:  03/08/2011  Job:  [id number]  Electronically Signed by Lance Muss MD on 04/09/2011 02:26:56 PM

## 2011-04-09 NOTE — Cardiovascular Report (Signed)
NAME:  MIRTA, MALLY NO.:  [id number]  MEDICAL RECORD NO.:  [id number]           PATIENT TYPE:  I  LOCATION:  2910                         FACILITY:  [facility]  PHYSICIAN:  Corky Crafts, MDDATE OF BIRTH:  [birth year]  DATE OF PROCEDURE:  03/07/2011 DATE OF DISCHARGE:                           CARDIAC CATHETERIZATION   PRIMARY CARE PHYSICIAN:  Thora Lance, MD  PRIMARY CARDIOLOGY:  Jake Bathe, MD  PROCEDURES PERFORMED:  Left heart catheterization, coronary angiogram, aspiration thrombectomy, PCI of the right coronary artery.  OPERATOR:  Corky Crafts, MD  INDICATIONS:  Acute inferoposterior ST elevation MI.  PROCEDURE NARRATIVE:  The patient was emergently brought to the Cath Lab.  She was prepped and draped in the usual sterile fashion.  Her right groin was infiltrated with 1% lidocaine.  A 6-French sheath was placed into the right common femoral artery using the modified Seldinger technique.  Left coronary artery angiography was performed using a JL- 3.5 pigtail catheter.  The catheter was advanced to the vessel ostium under fluoroscopic guidance.  Digital angiography was performed in multiple projections using hand injection of contrast.  Right coronary artery angiography was performed using a JR-4 guiding catheter in a similar fashion.  The intervention was then performed.  The pigtail was used to measure pressures in the left ventricle.  A pullback was performed under continuous hemodynamic pressure monitoring.  The sheaths were removed using manual compression.  Angiomax was used during the procedure for anticoagulation.  FINDINGS:  The left main is widely patent. Left circumflex is a large vessel.  There is a 25% midvessel lesion just before OM-1.  The OM-1 ostium has a 90% stenosis.  The OM-2 is a small vessel and widely patent. The left anterior descending is a large vessel.  There is moderate mid vessel disease likely up to  60-70%.  The lesion is hazy. The right coronary artery is a large dominant vessel.  There is a long area of disease in the mid vessel.  There is a focal 95% area in the proximal portion of this diffuse lesion that appears to be the culprit. The posterolateral artery is medium-sized and widely patent.  The posterior descending artery is also a medium size.  There does appear to be some thrombotic material in the distal posterolateral artery, which likely embolized from the more proximal area.  HEMODYNAMICS:  Left ventricular pressure 157/10 with an LVEDP of 90 mmHg.  Aortic pressure 149/62 with a mean aortic pressure of 96 mmHg.  PCI NARRATIVE:  JR-4 guiding catheter was used.  Prowater wire was used 2.5 x 20.  Apex balloon was used.  Two bare-metal stents were placed in overlapping fashion.  Distally, a 3.0 x 30 ZETA stent was deployed.  In overlapping fashion, a 3.0 x 15 Vision stent was deployed.  The entire area was postdilated with a 3.5 x 25  Trek inflated to 18 atmospheres for several inflations.  There was no residual stenosis.  At the end of the procedure, the sluggish flow was noted in the very distal PDA.  IMPRESSION: 1. Acute inferior posterior myocardial infarction.  Successful  bare-     metal stent placed into the right coronary artery midportion. 2. Large stent overlapping postdilated to greater than 3.6 mm in     diameter.  RECOMMENDATIONS:  Continue aspirin and likely Plavix.  She did not report any allergies, but there is a question of whether she is tolerated Plavix in the past.  Since she is 77 years old, I would be hesitant to use some of the stronger antiplatelet agents for fear of causing bleeding problems.  No beta-blocker for now as her heart rate is about 58.  She will likely resume her home dose of beta-blocker when her heart rate is stable.  Continue aggressive secondary prevention.  She will be watched in the intensive care unit.     Corky Crafts, MD     JSV/MEDQ  D:  03/07/2011  T:  03/08/2011  Job:  [id number]  Electronically Signed by Lance Muss MD on 04/09/2011 02:26:54 PM

## 2011-04-21 ENCOUNTER — Other Ambulatory Visit: Payer: Self-pay | Admitting: Internal Medicine

## 2011-04-22 ENCOUNTER — Ambulatory Visit
Admission: RE | Admit: 2011-04-22 | Discharge: 2011-04-22 | Disposition: A | Discharge status: Home / Self Care | Payer: Medicare Other | Source: Ambulatory Visit | Attending: Internal Medicine | Admitting: Internal Medicine

## 2011-04-28 ENCOUNTER — Institutional Professional Consult (permissible substitution): Payer: Medicare Other | Admitting: Internal Medicine

## 2011-05-09 NOTE — Cardiovascular Report (Signed)
[place]  Patient:    Sonya [name], NELSON                     MRN: [id number] Proc. Date: 06/04/00 Adm. Date:  [id number] Disc. Date: [id number] Attending:  Corliss Marcus CC:         [place]. Earl Gala, M.D.             Cardiac Catheterization Laboratory                        Cardiac Catheterization  CINE NUMBER:  [id number]  PROCEDURES PERFORMED: 1. Left heart catheterization. 2. Coronary angiography. 3. Left ventriculogram. 4. Percutaneous closure of right femoral artery (Perclose).  INDICATIONS:  The patient is an 77 year old woman with known ASCVD, now three years status post PTCA and stent implantation in the proximal diagonal branch. She underwent a myocardial perfusion study for vague symptoms of dyspnea and fatigue in March of 2001, revealing some reversible anterolateral defects, felt to be consistent with InStent restenosis.  The decision was made for medical therapy only.  However, yesterday, she developed anterior substernal chest pain which lasted approximately 1-2 hours.  She was brought to [facility] and myocardial infarction has been ruled out.  She is brought to the catheterization laboratory at this time to identify the extent of the disease and provide for further therapeutic options.  PROCEDURAL NOTE:  Left heart catheterization was carried out following the percutaneous insertion of a 6-French catheter sheath utilizing an anterior approach over a guiding J-wire into the right femoral artery.  A 6-French 110 cm pigtail catheter was used to measure pressures in the aorta and in the left ventricle, as well as perform a left ventriculogram in the 30 degree RAO angulation.  A 6-French #4 left and right Judkins catheters were used for coronary angiography.  This was following the sublingual administration of 0.4 mg of nitroglycerin.  At the completion of the procedure, the catheter, and catheter sheaths were removed.  Hemostasis  was achieved by utilization of the Perclose percutaneous closure system.  The patient was transported to the recovery area in stable condition with an intact distal pulse.  HEMODYNAMICS:  Systemic arterial pressure was 130/60 with a mean of 91 mmHg. The left ventricular end-diastolic pressure was 16 mmHg pre-ventriculogram and post-ventriculogram.  There was no systolic gradient across the aortic valve.  ANGIOGRAPHY:  The left ventriculogram demonstrated normal left ventricular chamber size and global systolic function.  ___________ of the ejection fraction is 65-70%.  There was left coronary calcification seen, as well as the previously placed stent.  There was no mitral regurgitation.  There was mitral annular calcification.  There was a right dominant coronary system present.  The main left coronary artery was normal.  The left anterior descending artery and its branches were without significant obstruction.  There are luminal irregularities within the LAD, and it is a tight one vessel, barely reaching the apex.  A moderate to large post diagonal branch arises which is the site of previous stent implantation.  There is no residual stenosis seen within this device.  The left circumflex artery and its branches are minimally diseased; the vessel has a tubular narrowing of approximately 30%, extending about 10-12 mm prior to the origin of a moderate size obtuse marginal branch.  The ongoing circumflex gives rise to two small posterolateral branches.  The right coronary artery and its branches are large and without significant obstruction.  It gives rise to a large posterior descending artery, as well as a large posterolateral segment and branch.  No significant obstructions were seen within this vessel.  Collateral vessels were not seen.  FINAL IMPRESSION: 1. Arteriosclerotic cardiovascular disease, single vessel. 2. Widely patent stent site of first diagonal branch. 3. Intact  left ventricular size and systolic function. 4. Noncardiac chest pain, probably gastroesophageal reflux disease.  PLAN:  The patient will be discharged to home later this evening.  She is already treated with a Prilosec.  Will change this to Aciphex 30 mg p.o. q.d., and have her follow up with Fayrene Fearing C. Earl Gala, M.D. in the near future. DD:  06/04/00 TD:  06/05/00 Job: [id number]

## 2011-05-09 NOTE — H&P (Signed)
[place]  Patient:    Sonya [name], Sonya [name]                     MRN: [id number] Adm. Date:  [id number] Attending:  Corliss Marcus                         History and Physical  HISTORY:  Sonya [name] is an 77 year old female, who is followed by Dr. Ty Hilts for coronary artery disease.  She had a left heart catheterization performed, which revealed two vessel coronary artery disease; she underwent a PTCA and stent deployment in her diagonal branch. She continued to have some lower extremity weakness, malaise and fatigue. Adenosine Cardiolite was performed in the office, and revealed a small area of reversibility in the anterior apical wall, which could be attributed to stent restenosis in the diagonal.  However, since the patient was asymptomatic, it was elected to follow the patient.  While at work today, she developed indigestional type symptoms, stating that she felt that if she could burp, she would have relieved symptoms.  She took three sublingual nitroglycerin while at work without relief, and subsequently presented to the emergency room.  Upon arrival to the ER, she was started on IV nitroglycerin and given ______ which resolved her chest tightness.  She currently is pain-free.  Her last heart catheterization was September, 1998.  PAST MEDICAL HISTORY:  As noted above; significant for: 1. Coronary artery disease status post stent in the diagonal for 80% lesion. 2. GERD. 3. Depression. 4. Postmenopausal status.  PAST SURGICAL HISTORY:  Significant for appendectomy, cataract extraction, tonsillectomy.  CURRENT MEDICATIONS:  Dosage of medications are unknown: 1. Prilosec. 2. Prozac. 3. Enteric-coated aspirin. 4. Centrum Silver. 5. Vitamin-C and -E. 6. Detrol.  REVIEW OF SYSTEMS:  Urinary incontinence.  She has recently returned from the beach; sounds as though she had an enjoyable time from the beach.  No significant alcohol  intake.  Has participated in Hispanic meals.  Otherwise, review of systems does not reveal any shortness of breath, palpitations, tachyarrhythmias or pedal edema.  FAMILY HISTORY:  Father died of stroke, age 9.  Mother died at age 56.  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 She has been widowed; has no children.  PHYSICAL EXAMINATION:  VITAL SIGNS:  Blood pressure 129/66, heart rate 62, respiratory rate 27 and O2 at on room air (please see Nurses Notes).  HEENT:  Unremarkable.  There are no carotid bruits.  No neck vein distention is  noted.  PULMONARY:  Exam reveals breath sounds which are equal and clear to auscultation.  Chest x-ray reveals questionable right basilar infiltrate.  O2 saturations are noted to be 94% on room air.  CARDIOVASCULAR:  Exam reveals a regular rate and rhythm; normal S1 and S2.  ABDOMEN:  Soft, nontender.  There is no tenderness to palpation in the epigastric region.  EXTREMITIES:  Do not reveal any peripheral edema.  SKIN:  Warm and dry.  NEUROLOGIC:  Nonfocal.  LABORATORY DATA:  Chest x-ray reveals questionable right lower lobe infiltrate,  mild increased vascularity; study was a portable, however.  ECG reveals a normal sinus rhythm with T-wave inversions in the precordial leads; this is unchanged from her previous EKG.  Other laboratory data is unavailable at this time.  IMPRESSION:  Chest pain; atypical in nature.  PLAN:  In view of the patients known coronary artery  disease history, she will e admitted to the rule out unit.  Serial cardiac enzymes will be obtained.  The patient will have a repeat PA and lateral chest x-ray when more stable.  Further recommendations will be pending the outcome of the cardiac enzymes. DD:  06/03/00 TD:  06/03/00 Job: [id number]

## 2011-12-10 DIAGNOSIS — H18429 Band keratopathy, unspecified eye: Secondary | ICD-10-CM | POA: Insufficient documentation

## 2012-01-15 DIAGNOSIS — H353290 Exudative age-related macular degeneration, unspecified eye, stage unspecified: Secondary | ICD-10-CM | POA: Insufficient documentation

## 2012-01-15 DIAGNOSIS — H18429 Band keratopathy, unspecified eye: Secondary | ICD-10-CM | POA: Diagnosis not present

## 2012-01-15 DIAGNOSIS — Z961 Presence of intraocular lens: Secondary | ICD-10-CM | POA: Diagnosis not present

## 2012-01-15 DIAGNOSIS — Z9889 Other specified postprocedural states: Secondary | ICD-10-CM | POA: Insufficient documentation

## 2012-01-22 DIAGNOSIS — N8182 Incompetence or weakening of pubocervical tissue: Secondary | ICD-10-CM | POA: Diagnosis not present

## 2012-02-05 DIAGNOSIS — E7800 Pure hypercholesterolemia, unspecified: Secondary | ICD-10-CM | POA: Diagnosis not present

## 2012-02-05 DIAGNOSIS — E785 Hyperlipidemia, unspecified: Secondary | ICD-10-CM | POA: Diagnosis not present

## 2012-02-05 DIAGNOSIS — I2510 Atherosclerotic heart disease of native coronary artery without angina pectoris: Secondary | ICD-10-CM | POA: Diagnosis not present

## 2012-03-08 DIAGNOSIS — L820 Inflamed seborrheic keratosis: Secondary | ICD-10-CM | POA: Diagnosis not present

## 2012-03-08 DIAGNOSIS — L719 Rosacea, unspecified: Secondary | ICD-10-CM | POA: Diagnosis not present

## 2012-03-18 DIAGNOSIS — N8182 Incompetence or weakening of pubocervical tissue: Secondary | ICD-10-CM | POA: Diagnosis not present

## 2012-03-18 DIAGNOSIS — N644 Mastodynia: Secondary | ICD-10-CM | POA: Diagnosis not present

## 2012-03-22 ENCOUNTER — Other Ambulatory Visit: Payer: Self-pay | Admitting: Obstetrics and Gynecology

## 2012-03-22 DIAGNOSIS — N644 Mastodynia: Secondary | ICD-10-CM

## 2012-03-30 DIAGNOSIS — R079 Chest pain, unspecified: Secondary | ICD-10-CM | POA: Diagnosis not present

## 2012-03-30 DIAGNOSIS — R5381 Other malaise: Secondary | ICD-10-CM | POA: Diagnosis not present

## 2012-04-05 DIAGNOSIS — I2510 Atherosclerotic heart disease of native coronary artery without angina pectoris: Secondary | ICD-10-CM | POA: Diagnosis not present

## 2012-04-05 DIAGNOSIS — R079 Chest pain, unspecified: Secondary | ICD-10-CM | POA: Diagnosis not present

## 2012-04-05 DIAGNOSIS — G459 Transient cerebral ischemic attack, unspecified: Secondary | ICD-10-CM | POA: Diagnosis not present

## 2012-04-05 DIAGNOSIS — I10 Essential (primary) hypertension: Secondary | ICD-10-CM | POA: Diagnosis not present

## 2012-04-19 DIAGNOSIS — I259 Chronic ischemic heart disease, unspecified: Secondary | ICD-10-CM | POA: Diagnosis not present

## 2012-04-19 DIAGNOSIS — G544 Lumbosacral root disorders, not elsewhere classified: Secondary | ICD-10-CM | POA: Diagnosis not present

## 2012-04-19 DIAGNOSIS — I6350 Cerebral infarction due to unspecified occlusion or stenosis of unspecified cerebral artery: Secondary | ICD-10-CM | POA: Diagnosis not present

## 2012-04-22 DIAGNOSIS — Z961 Presence of intraocular lens: Secondary | ICD-10-CM | POA: Diagnosis not present

## 2012-04-22 DIAGNOSIS — H353290 Exudative age-related macular degeneration, unspecified eye, stage unspecified: Secondary | ICD-10-CM | POA: Diagnosis not present

## 2012-04-22 DIAGNOSIS — H18429 Band keratopathy, unspecified eye: Secondary | ICD-10-CM | POA: Diagnosis not present

## 2012-05-18 DIAGNOSIS — N8182 Incompetence or weakening of pubocervical tissue: Secondary | ICD-10-CM | POA: Diagnosis not present

## 2012-05-18 DIAGNOSIS — N644 Mastodynia: Secondary | ICD-10-CM | POA: Diagnosis not present

## 2012-06-04 DIAGNOSIS — I2510 Atherosclerotic heart disease of native coronary artery without angina pectoris: Secondary | ICD-10-CM | POA: Diagnosis not present

## 2012-06-04 DIAGNOSIS — I10 Essential (primary) hypertension: Secondary | ICD-10-CM | POA: Diagnosis not present

## 2012-06-04 DIAGNOSIS — E7800 Pure hypercholesterolemia, unspecified: Secondary | ICD-10-CM | POA: Diagnosis not present

## 2012-06-14 DIAGNOSIS — L719 Rosacea, unspecified: Secondary | ICD-10-CM | POA: Diagnosis not present

## 2012-07-19 DIAGNOSIS — N8182 Incompetence or weakening of pubocervical tissue: Secondary | ICD-10-CM | POA: Diagnosis not present

## 2012-07-22 DIAGNOSIS — Z961 Presence of intraocular lens: Secondary | ICD-10-CM | POA: Diagnosis not present

## 2012-07-22 DIAGNOSIS — H353290 Exudative age-related macular degeneration, unspecified eye, stage unspecified: Secondary | ICD-10-CM | POA: Diagnosis not present

## 2012-07-22 DIAGNOSIS — H18429 Band keratopathy, unspecified eye: Secondary | ICD-10-CM | POA: Diagnosis not present

## 2012-08-04 DIAGNOSIS — E7800 Pure hypercholesterolemia, unspecified: Secondary | ICD-10-CM | POA: Diagnosis not present

## 2012-08-04 DIAGNOSIS — I10 Essential (primary) hypertension: Secondary | ICD-10-CM | POA: Diagnosis not present

## 2012-08-04 DIAGNOSIS — I2510 Atherosclerotic heart disease of native coronary artery without angina pectoris: Secondary | ICD-10-CM | POA: Diagnosis not present

## 2012-08-10 DIAGNOSIS — R079 Chest pain, unspecified: Secondary | ICD-10-CM | POA: Diagnosis not present

## 2012-08-10 DIAGNOSIS — R6883 Chills (without fever): Secondary | ICD-10-CM | POA: Diagnosis not present

## 2012-08-11 ENCOUNTER — Other Ambulatory Visit: Payer: Self-pay | Admitting: Internal Medicine

## 2012-08-11 DIAGNOSIS — R918 Other nonspecific abnormal finding of lung field: Secondary | ICD-10-CM

## 2012-08-12 DIAGNOSIS — R6883 Chills (without fever): Secondary | ICD-10-CM | POA: Diagnosis not present

## 2012-08-13 ENCOUNTER — Other Ambulatory Visit: Payer: Medicare Other

## 2012-08-13 DIAGNOSIS — H18429 Band keratopathy, unspecified eye: Secondary | ICD-10-CM | POA: Diagnosis not present

## 2012-08-13 DIAGNOSIS — Z961 Presence of intraocular lens: Secondary | ICD-10-CM | POA: Diagnosis not present

## 2012-08-16 ENCOUNTER — Ambulatory Visit
Admission: RE | Admit: 2012-08-16 | Discharge: 2012-08-16 | Disposition: A | Discharge status: Home / Self Care | Payer: Medicare Other | Source: Ambulatory Visit | Attending: Internal Medicine | Admitting: Internal Medicine

## 2012-08-16 DIAGNOSIS — J984 Other disorders of lung: Secondary | ICD-10-CM | POA: Diagnosis not present

## 2012-08-16 DIAGNOSIS — K449 Diaphragmatic hernia without obstruction or gangrene: Secondary | ICD-10-CM | POA: Diagnosis not present

## 2012-08-16 DIAGNOSIS — R911 Solitary pulmonary nodule: Secondary | ICD-10-CM | POA: Diagnosis not present

## 2012-08-16 DIAGNOSIS — R918 Other nonspecific abnormal finding of lung field: Secondary | ICD-10-CM

## 2012-08-16 MED ORDER — IOHEXOL 300 MG/ML  SOLN
75.0000 mL | Freq: Once | INTRAMUSCULAR | Status: AC | PRN
  Administered 2012-08-16: 75 mL via INTRAVENOUS

## 2012-08-30 DIAGNOSIS — R918 Other nonspecific abnormal finding of lung field: Secondary | ICD-10-CM | POA: Diagnosis not present

## 2012-08-30 DIAGNOSIS — R5381 Other malaise: Secondary | ICD-10-CM | POA: Diagnosis not present

## 2012-08-30 DIAGNOSIS — R5383 Other fatigue: Secondary | ICD-10-CM | POA: Diagnosis not present

## 2012-09-02 DIAGNOSIS — H353290 Exudative age-related macular degeneration, unspecified eye, stage unspecified: Secondary | ICD-10-CM | POA: Diagnosis not present

## 2012-09-13 DIAGNOSIS — N8182 Incompetence or weakening of pubocervical tissue: Secondary | ICD-10-CM | POA: Diagnosis not present

## 2012-09-13 DIAGNOSIS — Z1231 Encounter for screening mammogram for malignant neoplasm of breast: Secondary | ICD-10-CM | POA: Diagnosis not present

## 2012-09-21 DIAGNOSIS — B351 Tinea unguium: Secondary | ICD-10-CM | POA: Diagnosis not present

## 2012-09-21 DIAGNOSIS — D692 Other nonthrombocytopenic purpura: Secondary | ICD-10-CM | POA: Diagnosis not present

## 2012-09-21 DIAGNOSIS — S81009A Unspecified open wound, unspecified knee, initial encounter: Secondary | ICD-10-CM

## 2012-09-21 DIAGNOSIS — L719 Rosacea, unspecified: Secondary | ICD-10-CM | POA: Diagnosis not present

## 2012-09-21 HISTORY — DX: Unspecified open wound, unspecified knee, initial encounter: S81.009A

## 2012-09-28 DIAGNOSIS — R918 Other nonspecific abnormal finding of lung field: Secondary | ICD-10-CM | POA: Diagnosis not present

## 2012-09-28 DIAGNOSIS — Z23 Encounter for immunization: Secondary | ICD-10-CM | POA: Diagnosis not present

## 2012-10-03 DIAGNOSIS — K449 Diaphragmatic hernia without obstruction or gangrene: Secondary | ICD-10-CM | POA: Insufficient documentation

## 2012-10-12 DIAGNOSIS — IMO0002 Reserved for concepts with insufficient information to code with codable children: Secondary | ICD-10-CM | POA: Diagnosis not present

## 2012-10-12 DIAGNOSIS — M79609 Pain in unspecified limb: Secondary | ICD-10-CM | POA: Diagnosis not present

## 2012-10-13 ENCOUNTER — Emergency Department (HOSPITAL_COMMUNITY)
Admission: EM | Admit: 2012-10-13 | Discharge: 2012-10-13 | Disposition: A | Discharge status: Home / Self Care | Payer: Medicare Other | Attending: Emergency Medicine | Admitting: Emergency Medicine

## 2012-10-13 DIAGNOSIS — Y9389 Activity, other specified: Secondary | ICD-10-CM | POA: Insufficient documentation

## 2012-10-13 DIAGNOSIS — S81812A Laceration without foreign body, left lower leg, initial encounter: Secondary | ICD-10-CM

## 2012-10-13 DIAGNOSIS — Z8719 Personal history of other diseases of the digestive system: Secondary | ICD-10-CM | POA: Diagnosis not present

## 2012-10-13 DIAGNOSIS — Z23 Encounter for immunization: Secondary | ICD-10-CM | POA: Insufficient documentation

## 2012-10-13 DIAGNOSIS — S91009A Unspecified open wound, unspecified ankle, initial encounter: Secondary | ICD-10-CM | POA: Diagnosis not present

## 2012-10-13 DIAGNOSIS — Z79899 Other long term (current) drug therapy: Secondary | ICD-10-CM | POA: Insufficient documentation

## 2012-10-13 DIAGNOSIS — Z7982 Long term (current) use of aspirin: Secondary | ICD-10-CM | POA: Diagnosis not present

## 2012-10-13 DIAGNOSIS — J45909 Unspecified asthma, uncomplicated: Secondary | ICD-10-CM | POA: Insufficient documentation

## 2012-10-13 DIAGNOSIS — F3289 Other specified depressive episodes: Secondary | ICD-10-CM | POA: Insufficient documentation

## 2012-10-13 DIAGNOSIS — IMO0002 Reserved for concepts with insufficient information to code with codable children: Secondary | ICD-10-CM | POA: Diagnosis not present

## 2012-10-13 DIAGNOSIS — Z8673 Personal history of transient ischemic attack (TIA), and cerebral infarction without residual deficits: Secondary | ICD-10-CM | POA: Insufficient documentation

## 2012-10-13 DIAGNOSIS — I2510 Atherosclerotic heart disease of native coronary artery without angina pectoris: Secondary | ICD-10-CM | POA: Insufficient documentation

## 2012-10-13 DIAGNOSIS — Z8709 Personal history of other diseases of the respiratory system: Secondary | ICD-10-CM | POA: Insufficient documentation

## 2012-10-13 DIAGNOSIS — Z87891 Personal history of nicotine dependence: Secondary | ICD-10-CM | POA: Insufficient documentation

## 2012-10-13 DIAGNOSIS — S81809A Unspecified open wound, unspecified lower leg, initial encounter: Secondary | ICD-10-CM | POA: Diagnosis not present

## 2012-10-13 DIAGNOSIS — I5032 Chronic diastolic (congestive) heart failure: Secondary | ICD-10-CM | POA: Insufficient documentation

## 2012-10-13 DIAGNOSIS — Y9289 Other specified places as the place of occurrence of the external cause: Secondary | ICD-10-CM | POA: Insufficient documentation

## 2012-10-13 DIAGNOSIS — S81009A Unspecified open wound, unspecified knee, initial encounter: Secondary | ICD-10-CM | POA: Insufficient documentation

## 2012-10-13 DIAGNOSIS — S41009A Unspecified open wound of unspecified shoulder, initial encounter: Secondary | ICD-10-CM | POA: Diagnosis not present

## 2012-10-13 DIAGNOSIS — F329 Major depressive disorder, single episode, unspecified: Secondary | ICD-10-CM | POA: Insufficient documentation

## 2012-10-13 DIAGNOSIS — I10 Essential (primary) hypertension: Secondary | ICD-10-CM | POA: Diagnosis not present

## 2012-10-13 DIAGNOSIS — W268XXA Contact with other sharp object(s), not elsewhere classified, initial encounter: Secondary | ICD-10-CM | POA: Insufficient documentation

## 2012-10-13 DIAGNOSIS — Z8679 Personal history of other diseases of the circulatory system: Secondary | ICD-10-CM | POA: Insufficient documentation

## 2012-10-13 MED ORDER — TETANUS-DIPHTH-ACELL PERTUSSIS 5-2.5-18.5 LF-MCG/0.5 IM SUSP
0.5000 mL | Freq: Once | INTRAMUSCULAR | Status: AC
  Administered 2012-10-13: 0.5 mL via INTRAMUSCULAR
  Filled 2012-10-13: qty 0.5

## 2012-10-13 MED ORDER — TRAMADOL HCL 50 MG PO TABS: 50.0000 mg | ORAL_TABLET | Freq: Four times a day (QID) | ORAL | Status: DC | PRN

## 2012-10-13 NOTE — ED Notes (Signed)
Rinse laceration. Suture cart at bedside.

## 2012-10-13 NOTE — ED Provider Notes (Signed)
Medical screening examination/treatment/procedure(s) were conducted as a shared visit with non-physician practitioner(s) and myself.  I personally evaluated the patient during the encounter.  Pt with stellate laceration to left shin after blunt trauma.  Bleeding controlled, laceration repaired.  Olivia Mackie, MD 10/13/12 [phone]

## 2012-10-13 NOTE — ED Provider Notes (Signed)
History     CSN: [id number]  Arrival date & time 10/13/12  0302   First MD Initiated Contact with Patient 10/13/12 [phone]      Chief Complaint  Patient presents with  . Extremity Laceration   HPI  History provided by the patient. Patient is a 78 year old female who [facility] with laceration to her left lower leg. Patient states that she rolled out of bed and hit her walker with her left leg. She believes she caught her leg on the screw on the side of the walker. This caused a skin tear laceration over the left shin area. Patient had associated bleeding that was controlled with bandage. She called the security and staff to come to her room. She denies having any head injury or LOC. She denies any other pain or injury. Denies any neck or back pain. Denies any chest pain or shortness of breath. Patient is unsure of her last tetanus shot.    Past Medical History  Diagnosis Date  . Heart attack   . CAD (coronary artery disease)   . Acute bronchitis   . Chronic diastolic congestive heart failure   . Macular degeneration   . Hypertension   . Depression   . Asthma   . Hiatal hernia   . Spastic colon   . TIA (transient ischemic attack)     Past Surgical History  Procedure Date  . Appendectomy   . Tonsillectomy and adenoidectomy   . Cataract extraction   . Cardiac catheterization 1996, 2001, 2012  . Carotid stent     diagonal branch of LCA    Family History  Problem Relation Age of Onset  . Coronary artery disease Sister     early age onset    History  Substance Use Topics  . Smoking status: Former Smoker    Quit date: 12/22/1968  . Smokeless tobacco: Not on file  . Alcohol Use: 3.0 oz/week    5 Glasses of wine per week    OB History    Grav Para Term Preterm Abortions TAB SAB Ect Mult Living                  Review of Systems  HENT: Negative for neck pain and neck stiffness.   Respiratory: Negative for shortness of breath.     Cardiovascular: Negative for chest pain.  Musculoskeletal: Negative for back pain.  Skin:       Laceration left lower leg  Neurological: Negative for dizziness, weakness, light-headedness, numbness and headaches.    Allergies  Plavix  Home Medications   Current Outpatient Rx  Name Route Sig Dispense Refill  . ASPIRIN 325 MG PO TABS Oral Take 325 mg by mouth daily.      . OCUVITE PO TABS Oral Take 1 tablet by mouth daily.    Marland Kitchen ESOMEPRAZOLE MAGNESIUM 40 MG PO CPDR Oral Take 40 mg by mouth daily before supper.     Marland Kitchen FERROUS SULFATE 325 (65 FE) MG PO TABS Oral Take 325 mg by mouth daily with breakfast.    . FLUOXETINE HCL 40 MG PO CAPS Oral Take 40 mg by mouth daily.      Marland Kitchen FLUTICASONE-SALMETEROL 250-50 MCG/DOSE IN AEPB Inhalation Inhale 1 puff into the lungs every 12 (twelve) hours.      . ISOSORBIDE MONONITRATE ER 30 MG PO TB24 Oral Take 30 mg by mouth daily.      Marland Kitchen LOTEPREDNOL ETABONATE 0.5 % OP SUSP Both Eyes Place  1 drop into both eyes 4 (four) times daily.    Marland Kitchen METOPROLOL TARTRATE 25 MG PO TABS Oral Take 12.5 mg by mouth 2 (two) times daily. 1 twice per day    . NITROGLYCERIN 0.4 MG SL SUBL Sublingual Place 0.4 mg under the tongue every 5 (five) minutes as needed.      Marland Kitchen ROSUVASTATIN CALCIUM 10 MG PO TABS Oral Take 10 mg by mouth once a week.      BP 191/73  Pulse 63  Resp 18  SpO2 95%  Physical Exam  Nursing note and vitals reviewed. Constitutional: She is oriented to person, place, and time. She appears well-developed and well-nourished. No distress.  HENT:  Head: Normocephalic.  Cardiovascular: Normal rate and regular rhythm.   Pulmonary/Chest: Effort normal and breath sounds normal. No respiratory distress. She has no wheezes. She has no rales.  Abdominal: Soft.  Musculoskeletal:       Irregular flap laceration to left anterior lower leg. There is a very small amount bleeding. Normal distal sensations and movements in foot. Normal dorsal pedal pulses. Wound explored  through a range of motion without any concerning deep structure involvement.  Neurological: She is alert and oriented to person, place, and time.  Skin: Skin is warm and dry.  Psychiatric: She has a normal mood and affect. Her behavior is normal.    ED Course  Procedures    LACERATION REPAIR Performed by: Angus Seller Authorized by: Angus Seller Consent: Verbal consent obtained. Risks and benefits: risks, benefits and alternatives were discussed Consent given by: patient Patient identity confirmed: provided demographic data Prepped and Draped in normal sterile fashion Wound explored  Laceration Location: Left anterior lower leg  Laceration Length: 20 cm  No Foreign Bodies seen or palpated  Anesthesia: local infiltration  Local anesthetic: lidocaine 2% with epinephrine  Anesthetic total: 10 ml  Irrigation method: syringe Amount of cleaning: standard  Skin closure: Skin with 3-0 nylon   Number of sutures: 24   Technique: 4 horizontal mattress, 20 simple interrupted   Patient tolerance: Patient tolerated the procedure well with no immediate complications.     1. Laceration of lower leg, left       MDM  Patient seen and evaluated. Patient resting comfortably. She denies any other injuries or complaints.   Patient was also seen and evaluated by attending physician.  Tetanus was given.     Angus Seller, [place] 10/13/12 [phone]

## 2012-10-13 NOTE — ED Notes (Signed)
As per EMS, pt fell striking left leg on walker. No LOC/N/V.VSS.PWD.bleeding controled

## 2012-10-18 DIAGNOSIS — R5381 Other malaise: Secondary | ICD-10-CM | POA: Diagnosis not present

## 2012-10-18 DIAGNOSIS — Z9181 History of falling: Secondary | ICD-10-CM | POA: Diagnosis not present

## 2012-10-18 DIAGNOSIS — R293 Abnormal posture: Secondary | ICD-10-CM | POA: Diagnosis not present

## 2012-10-18 DIAGNOSIS — R269 Unspecified abnormalities of gait and mobility: Secondary | ICD-10-CM | POA: Diagnosis not present

## 2012-10-18 DIAGNOSIS — J45909 Unspecified asthma, uncomplicated: Secondary | ICD-10-CM | POA: Diagnosis not present

## 2012-10-18 DIAGNOSIS — H547 Unspecified visual loss: Secondary | ICD-10-CM | POA: Diagnosis not present

## 2012-10-18 DIAGNOSIS — S81809A Unspecified open wound, unspecified lower leg, initial encounter: Secondary | ICD-10-CM | POA: Diagnosis not present

## 2012-10-18 DIAGNOSIS — S91009A Unspecified open wound, unspecified ankle, initial encounter: Secondary | ICD-10-CM | POA: Diagnosis not present

## 2012-10-18 DIAGNOSIS — R279 Unspecified lack of coordination: Secondary | ICD-10-CM | POA: Diagnosis not present

## 2012-10-18 DIAGNOSIS — K449 Diaphragmatic hernia without obstruction or gangrene: Secondary | ICD-10-CM | POA: Diagnosis not present

## 2012-10-18 DIAGNOSIS — I10 Essential (primary) hypertension: Secondary | ICD-10-CM | POA: Diagnosis not present

## 2012-10-19 DIAGNOSIS — H547 Unspecified visual loss: Secondary | ICD-10-CM | POA: Diagnosis not present

## 2012-10-19 DIAGNOSIS — G459 Transient cerebral ischemic attack, unspecified: Secondary | ICD-10-CM | POA: Diagnosis not present

## 2012-10-19 DIAGNOSIS — R279 Unspecified lack of coordination: Secondary | ICD-10-CM | POA: Diagnosis not present

## 2012-10-19 DIAGNOSIS — I259 Chronic ischemic heart disease, unspecified: Secondary | ICD-10-CM | POA: Diagnosis not present

## 2012-10-19 DIAGNOSIS — R269 Unspecified abnormalities of gait and mobility: Secondary | ICD-10-CM | POA: Diagnosis not present

## 2012-10-19 DIAGNOSIS — R293 Abnormal posture: Secondary | ICD-10-CM | POA: Diagnosis not present

## 2012-10-19 DIAGNOSIS — Z9181 History of falling: Secondary | ICD-10-CM | POA: Diagnosis not present

## 2012-10-19 DIAGNOSIS — G544 Lumbosacral root disorders, not elsewhere classified: Secondary | ICD-10-CM | POA: Diagnosis not present

## 2012-10-20 DIAGNOSIS — R269 Unspecified abnormalities of gait and mobility: Secondary | ICD-10-CM | POA: Diagnosis not present

## 2012-10-20 DIAGNOSIS — H547 Unspecified visual loss: Secondary | ICD-10-CM | POA: Diagnosis not present

## 2012-10-20 DIAGNOSIS — R293 Abnormal posture: Secondary | ICD-10-CM | POA: Diagnosis not present

## 2012-10-20 DIAGNOSIS — R279 Unspecified lack of coordination: Secondary | ICD-10-CM | POA: Diagnosis not present

## 2012-10-20 DIAGNOSIS — Z9181 History of falling: Secondary | ICD-10-CM | POA: Diagnosis not present

## 2012-10-21 DIAGNOSIS — Z9181 History of falling: Secondary | ICD-10-CM | POA: Diagnosis not present

## 2012-10-21 DIAGNOSIS — R279 Unspecified lack of coordination: Secondary | ICD-10-CM | POA: Diagnosis not present

## 2012-10-21 DIAGNOSIS — R269 Unspecified abnormalities of gait and mobility: Secondary | ICD-10-CM | POA: Diagnosis not present

## 2012-10-21 DIAGNOSIS — R293 Abnormal posture: Secondary | ICD-10-CM | POA: Diagnosis not present

## 2012-10-21 DIAGNOSIS — H547 Unspecified visual loss: Secondary | ICD-10-CM | POA: Diagnosis not present

## 2012-10-26 DIAGNOSIS — R293 Abnormal posture: Secondary | ICD-10-CM | POA: Diagnosis not present

## 2012-10-26 DIAGNOSIS — R279 Unspecified lack of coordination: Secondary | ICD-10-CM | POA: Diagnosis not present

## 2012-10-26 DIAGNOSIS — R269 Unspecified abnormalities of gait and mobility: Secondary | ICD-10-CM | POA: Diagnosis not present

## 2012-10-26 DIAGNOSIS — Z9181 History of falling: Secondary | ICD-10-CM | POA: Diagnosis not present

## 2012-10-27 DIAGNOSIS — S91009A Unspecified open wound, unspecified ankle, initial encounter: Secondary | ICD-10-CM | POA: Diagnosis not present

## 2012-10-27 DIAGNOSIS — S81009A Unspecified open wound, unspecified knee, initial encounter: Secondary | ICD-10-CM | POA: Diagnosis not present

## 2012-11-03 DIAGNOSIS — S91009A Unspecified open wound, unspecified ankle, initial encounter: Secondary | ICD-10-CM | POA: Diagnosis not present

## 2012-11-03 DIAGNOSIS — N8110 Cystocele, unspecified: Secondary | ICD-10-CM | POA: Insufficient documentation

## 2012-11-10 DIAGNOSIS — S81809A Unspecified open wound, unspecified lower leg, initial encounter: Secondary | ICD-10-CM | POA: Diagnosis not present

## 2012-11-10 DIAGNOSIS — S91009A Unspecified open wound, unspecified ankle, initial encounter: Secondary | ICD-10-CM | POA: Diagnosis not present

## 2012-11-17 DIAGNOSIS — S91009A Unspecified open wound, unspecified ankle, initial encounter: Secondary | ICD-10-CM | POA: Diagnosis not present

## 2012-11-17 DIAGNOSIS — S81809A Unspecified open wound, unspecified lower leg, initial encounter: Secondary | ICD-10-CM | POA: Diagnosis not present

## 2012-11-17 DIAGNOSIS — Z7901 Long term (current) use of anticoagulants: Secondary | ICD-10-CM | POA: Diagnosis not present

## 2012-11-22 DIAGNOSIS — N8182 Incompetence or weakening of pubocervical tissue: Secondary | ICD-10-CM | POA: Diagnosis not present

## 2012-11-24 DIAGNOSIS — S81009A Unspecified open wound, unspecified knee, initial encounter: Secondary | ICD-10-CM | POA: Diagnosis not present

## 2012-11-24 DIAGNOSIS — S91009A Unspecified open wound, unspecified ankle, initial encounter: Secondary | ICD-10-CM | POA: Diagnosis not present

## 2012-11-25 DIAGNOSIS — I2510 Atherosclerotic heart disease of native coronary artery without angina pectoris: Secondary | ICD-10-CM | POA: Diagnosis not present

## 2012-11-25 DIAGNOSIS — E7800 Pure hypercholesterolemia, unspecified: Secondary | ICD-10-CM | POA: Diagnosis not present

## 2012-11-25 DIAGNOSIS — I10 Essential (primary) hypertension: Secondary | ICD-10-CM | POA: Diagnosis not present

## 2012-12-01 DIAGNOSIS — S91009A Unspecified open wound, unspecified ankle, initial encounter: Secondary | ICD-10-CM | POA: Diagnosis not present

## 2012-12-01 DIAGNOSIS — S81009A Unspecified open wound, unspecified knee, initial encounter: Secondary | ICD-10-CM | POA: Diagnosis not present

## 2012-12-08 DIAGNOSIS — S81009A Unspecified open wound, unspecified knee, initial encounter: Secondary | ICD-10-CM | POA: Diagnosis not present

## 2012-12-08 DIAGNOSIS — S81809A Unspecified open wound, unspecified lower leg, initial encounter: Secondary | ICD-10-CM | POA: Diagnosis not present

## 2012-12-16 DIAGNOSIS — H353290 Exudative age-related macular degeneration, unspecified eye, stage unspecified: Secondary | ICD-10-CM | POA: Diagnosis not present

## 2012-12-29 DIAGNOSIS — S81009A Unspecified open wound, unspecified knee, initial encounter: Secondary | ICD-10-CM | POA: Diagnosis not present

## 2013-01-05 DIAGNOSIS — S81009A Unspecified open wound, unspecified knee, initial encounter: Secondary | ICD-10-CM | POA: Diagnosis not present

## 2013-01-12 DIAGNOSIS — S81009A Unspecified open wound, unspecified knee, initial encounter: Secondary | ICD-10-CM | POA: Diagnosis not present

## 2013-01-17 DIAGNOSIS — N8182 Incompetence or weakening of pubocervical tissue: Secondary | ICD-10-CM | POA: Diagnosis not present

## 2013-01-26 DIAGNOSIS — S81009A Unspecified open wound, unspecified knee, initial encounter: Secondary | ICD-10-CM | POA: Diagnosis not present

## 2013-01-26 DIAGNOSIS — S91009A Unspecified open wound, unspecified ankle, initial encounter: Secondary | ICD-10-CM | POA: Diagnosis not present

## 2013-03-01 DIAGNOSIS — I6350 Cerebral infarction due to unspecified occlusion or stenosis of unspecified cerebral artery: Secondary | ICD-10-CM | POA: Diagnosis not present

## 2013-03-09 ENCOUNTER — Encounter: Payer: Self-pay | Admitting: Geriatric Medicine

## 2013-03-09 ENCOUNTER — Non-Acute Institutional Stay: Payer: Medicare Other | Admitting: Geriatric Medicine

## 2013-03-09 VITALS — BP 154/74 | HR 60 | Wt 142.0 lb

## 2013-03-09 DIAGNOSIS — R599 Enlarged lymph nodes, unspecified: Secondary | ICD-10-CM | POA: Diagnosis not present

## 2013-03-09 NOTE — Progress Notes (Signed)
Subjective:     Patient ID: Sonya [name], female   DOB: 31-Aug-1913, 79 y.o.   MRN: [id number]  Sore Throat  Associated symptoms include neck pain. Pertinent negatives include no congestion, coughing, diarrhea, shortness of breath, trouble swallowing or vomiting.   this patient presents to the clinic this afternoon due to onset of left neck and back of tongue pain since yesterday. Also mild discomfort the left side of her face and ear. Feels this has been worse the last 2 mornings better by late afternoon.   Review of Systems  Constitutional: Negative for fever, activity change and appetite change.  HENT: Positive for sneezing and neck pain. Negative for congestion, facial swelling, trouble swallowing, voice change and postnasal drip.   Respiratory: Negative for cough, shortness of breath and wheezing.   Cardiovascular: Negative for chest pain.  Gastrointestinal: Negative for nausea, vomiting and diarrhea.  Psychiatric/Behavioral: Negative for sleep disturbance.       Objective:   Physical Exam  Constitutional: She appears well-developed and well-nourished. No distress.  HENT:  Head: Normocephalic and atraumatic.  Mouth/Throat: Mucous membranes are normal. Oral lesions present.    Cardiovascular: Normal rate, regular rhythm and normal heart sounds.   Pulmonary/Chest: Effort normal and breath sounds normal.  Lymphadenopathy:       Head (left side): Submandibular adenopathy present. No submental, no tonsillar and no preauricular adenopathy present.  Skin: She is not diaphoretic.       Assessment:     Mild left submandibular lymph node enlargement, mild irritation posterior left side of tongue.      Plan:    Recommend gargle w/mouth wash BID for next several days.

## 2013-03-22 DIAGNOSIS — E041 Nontoxic single thyroid nodule: Secondary | ICD-10-CM | POA: Diagnosis not present

## 2013-03-22 DIAGNOSIS — R7989 Other specified abnormal findings of blood chemistry: Secondary | ICD-10-CM | POA: Diagnosis not present

## 2013-03-22 DIAGNOSIS — I10 Essential (primary) hypertension: Secondary | ICD-10-CM | POA: Diagnosis not present

## 2013-03-30 ENCOUNTER — Encounter: Payer: Self-pay | Admitting: Geriatric Medicine

## 2013-03-30 ENCOUNTER — Non-Acute Institutional Stay: Payer: Medicare Other | Admitting: Geriatric Medicine

## 2013-03-30 VITALS — BP 110/72 | HR 68 | Ht 58.6 in | Wt 145.0 lb

## 2013-03-30 DIAGNOSIS — M25519 Pain in unspecified shoulder: Secondary | ICD-10-CM | POA: Diagnosis not present

## 2013-03-30 DIAGNOSIS — I2510 Atherosclerotic heart disease of native coronary artery without angina pectoris: Secondary | ICD-10-CM

## 2013-03-30 DIAGNOSIS — F329 Major depressive disorder, single episode, unspecified: Secondary | ICD-10-CM | POA: Diagnosis not present

## 2013-03-30 DIAGNOSIS — I10 Essential (primary) hypertension: Secondary | ICD-10-CM | POA: Diagnosis not present

## 2013-03-30 DIAGNOSIS — M25511 Pain in right shoulder: Secondary | ICD-10-CM

## 2013-03-30 HISTORY — DX: Pain in unspecified shoulder: M25.519

## 2013-03-30 MED ORDER — METOPROLOL SUCCINATE ER 25 MG PO TB24: 25.0000 mg | ORAL_TABLET | Freq: Every day | ORAL | Status: DC

## 2013-03-30 NOTE — Progress Notes (Signed)
Patient ID: Sonya [name], female   DOB: 1913-07-09, 79 y.o.   MRN: [id number] Chief Complaint  Patient presents with  . Medical Managment of Chronic Issues    blood pressure, fatigue, depression    HPI This 79 year old female resident of [facility], independent living section, presents to clinic today in followup of blood pressure, depression and a new problem of left shoulder pain. Patient tells me she generally feels pretty good although she notes that after breakfast, when she takes her 6 pills  she has about 10 minutes where she feels sweaty and weak. This does pass after a little while and does not affect the rest of her day. She also reports left upper shoulder pain and decreased range of motion. Denies any specific injury. Shoulder problem is affecting her ability to complete some ADLs easily. Recent Lab reviewed today, all satisfactory.   Allergies Reviewed   Medications Reviewed   Data Reviewed      Lab, [facility], external 07/2012 Lipid: cholesterol 180, triglycerides 144, HDL 59, LDL 95     CBC: Wbc 9.6, Rbc 3.85, Hgb 11.7, Hct 36.4, Platelet 322 BUN 15, cr. .68 Urine bacteria few, protein 2+ Urine Culture negative    03/22/2013: 30 BC 7.3, hemoglobin 12.4, hematocrit 37.2, platelets 3:15.   Glucose 90, BUN 18, creatinine 0.72, sodium 139, potassium 4.8. Proteins/LFTs to be WNL   TSH 1.05     Review of Systems   DATA OBTAINED: from patient GENERAL: Feels well. No fevers, fatigue, change in activity status. No change in appetite, weight  SKIN: No itch, rash or open wounds EYES: No eye pain, dryness or itching. Very poor vision (not new) EARS: No earache, tinnitus, change in hearing NOSE: No congestion, drainage or bleeding MOUTH/THROAT: No mouth or tooth pain. No difficulty chewing or swallowing. RESPIRATORY: No cough, wheezing, SOB CARDIAC: No chest pain, palpitations. No edema. GI: No abdominal pain. No N/V/D or constipation. No heartburn or reflux.   GU: No dysuria, frequency or urgency.  No change in urine volume or character MUSCULOSKELETAL: Left shoulder pain, stiffness No back pain.  Gait is steady. No recent falls.  NEUROLOGIC: No dizziness, fainting, headache, No change in mental status.  PSYCHIATRIC: No anxiety, depression, behavior issue. Sleeps well.   Physical Exam  Filed Vitals:   03/30/13 1606  BP: 110/72  Pulse: 68   Filed Weights   03/30/13 1606  Weight: 145 lb (65.772 kg)    Body mass index is 29.71 kg/(m^2).  GENERAL APPEARANCE: No acute distress, appropriately groomed, normal body habitus. Alert, pleasant, conversant. HEAD: Normocephalic, atraumatic EYES: Conjunctiva/lids clear. EARS: . Hearing grossly normal. NOSE: No deformity or discharge. MOUTH/THROAT: Lips w/o lesions. Oral mucosa, tongue moist, w/o lesion. Oropharynx w/o redness or lesions.  NECK: Supple, full ROM. No thyroid tenderness, enlargement or nodule LYMPHATICS: No head, neck or supraclavicular adenopathy RESPIRATORY: Breathing is even, unlabored. Lung sounds are clear and full.  CARDIOVASCULAR: Heart RRR. No murmur or extra heart sounds   EDEMA: No peripheral or periorbital edema. No ascites GASTROINTESTINAL: Abdomen is soft, non-tender, not distended w/ normal bowel sounds.  MUSCULOSKELETAL: Left UE with decreased ROM at shoulder, non tender anterior, is tender posterior. Tender along upper trapezius. No shoulder jpint crepitus. Able to tolerate passive FullROM.   Back with mild kyphosis, no spinal process tenderness. Gait is steady with walker NEUROLOGIC: Oriented to time, place, person. Speech clear, no tremor.   PSYCHIATRIC: Mood and affect appropriate to situation  ASSESSMENT/PLAN  Hypertension BP satisfactory  today. Pt. Describes symptoms that may be consistent with low BP when she takes meds in the AM. Will change dosing times.  Depression Pt. Reports she does not feel depressed and may want to reduce /or stop fluoxetine. Medication  is listed as 40mg /day, pt thinks she may be taking only 20mg /day. She will call back with the correct dose.  Shoulder pain Pt. With recent onset left shoulder pian/ reduced ROM. No specific injury, may be related to to tight upper trapezius muscle. Recommend OT eval/tx, pt agrees.  CAD (coronary artery disease) Patient denies chest pain or shortness of breath. She continues to follow at intervals with Dr. Jayme Cloud, last visit December 2013. She has an upcoming appointment and feels he'll be checking her lipid status at that time.     Follow up: 3 months  Jillaine Waren T.Kaitlyn Skowron, NP-C 03/30/2013

## 2013-04-03 ENCOUNTER — Encounter: Payer: Self-pay | Admitting: Geriatric Medicine

## 2013-04-03 DIAGNOSIS — I2510 Atherosclerotic heart disease of native coronary artery without angina pectoris: Secondary | ICD-10-CM | POA: Insufficient documentation

## 2013-04-03 NOTE — Assessment & Plan Note (Signed)
Pt. With recent onset left shoulder pian/ reduced ROM. No specific injury, may be related to to tight upper trapezius muscle. Recommend OT eval/tx, pt agrees.

## 2013-04-03 NOTE — Assessment & Plan Note (Addendum)
Pt. Reports she does not feel depressed and may want to reduce /or stop fluoxetine. Medication is listed as 40mg /day, pt thinks she may be taking only 20mg /day. She will call back with the correct dose.

## 2013-04-03 NOTE — Assessment & Plan Note (Signed)
Patient denies chest pain or shortness of breath. She continues to follow at intervals with Dr. Jayme Cloud, last visit December 2013. She has an upcoming appointment and feels he'll be checking her lipid status at that time.

## 2013-04-03 NOTE — Assessment & Plan Note (Signed)
BP satisfactory today. Pt. Describes symptoms that may be consistent with low BP when she takes meds in the AM. Will change dosing times.

## 2013-04-18 DIAGNOSIS — N8182 Incompetence or weakening of pubocervical tissue: Secondary | ICD-10-CM | POA: Diagnosis not present

## 2013-05-02 DIAGNOSIS — L719 Rosacea, unspecified: Secondary | ICD-10-CM | POA: Diagnosis not present

## 2013-05-02 DIAGNOSIS — Z85828 Personal history of other malignant neoplasm of skin: Secondary | ICD-10-CM | POA: Diagnosis not present

## 2013-05-02 DIAGNOSIS — L578 Other skin changes due to chronic exposure to nonionizing radiation: Secondary | ICD-10-CM | POA: Diagnosis not present

## 2013-05-19 DIAGNOSIS — H18429 Band keratopathy, unspecified eye: Secondary | ICD-10-CM | POA: Diagnosis not present

## 2013-05-19 DIAGNOSIS — Z961 Presence of intraocular lens: Secondary | ICD-10-CM | POA: Diagnosis not present

## 2013-05-19 DIAGNOSIS — H353290 Exudative age-related macular degeneration, unspecified eye, stage unspecified: Secondary | ICD-10-CM | POA: Diagnosis not present

## 2013-05-25 DIAGNOSIS — Z961 Presence of intraocular lens: Secondary | ICD-10-CM | POA: Diagnosis not present

## 2013-05-25 DIAGNOSIS — H18429 Band keratopathy, unspecified eye: Secondary | ICD-10-CM | POA: Diagnosis not present

## 2013-05-26 DIAGNOSIS — I2510 Atherosclerotic heart disease of native coronary artery without angina pectoris: Secondary | ICD-10-CM | POA: Diagnosis not present

## 2013-05-26 DIAGNOSIS — I10 Essential (primary) hypertension: Secondary | ICD-10-CM | POA: Diagnosis not present

## 2013-05-26 DIAGNOSIS — E7800 Pure hypercholesterolemia, unspecified: Secondary | ICD-10-CM | POA: Diagnosis not present

## 2013-06-14 DIAGNOSIS — N8182 Incompetence or weakening of pubocervical tissue: Secondary | ICD-10-CM | POA: Diagnosis not present

## 2013-06-29 ENCOUNTER — Encounter: Payer: Self-pay | Admitting: Geriatric Medicine

## 2013-06-29 ENCOUNTER — Non-Acute Institutional Stay: Payer: Medicare Other | Admitting: Geriatric Medicine

## 2013-06-29 VITALS — BP 150/62 | HR 68 | Ht 58.6 in | Wt 146.0 lb

## 2013-06-29 DIAGNOSIS — H6121 Impacted cerumen, right ear: Secondary | ICD-10-CM

## 2013-06-29 DIAGNOSIS — H6120 Impacted cerumen, unspecified ear: Secondary | ICD-10-CM

## 2013-06-29 DIAGNOSIS — I10 Essential (primary) hypertension: Secondary | ICD-10-CM | POA: Diagnosis not present

## 2013-06-29 NOTE — Progress Notes (Signed)
Patient ID: Sonya [name], female   DOB: 07/21/1913, 79 y.o.   MRN: [id number] [facility] [phone])  Chief Complaint  Patient presents with  . Medical Managment of Chronic Issues    blood pressure    HPI: This is a 79 y.o. female resident of [facility],  Independent Living section evaluated today for management of ongoing medical issues.  Patient continues to take her blood pressure medicine as prescribed, no recent blood pressure readings. She denies any headache, dizziness, shortness of breath or chest pain. Reports good morning symptoms of weakness have improved since timing of medication changed. Patient's only complaint today is left ear aching     Allergies  Allergen Reactions  . Plavix (Clopidogrel) Nausea And Vomiting  . Prednisone   . Statins    Medications  Current Outpatient Prescriptions on File Prior to Visit  Medication Sig Dispense Refill  . beta carotene w/minerals (OCUVITE) tablet Take 1 tablet by mouth daily.      Marland Kitchen esomeprazole (NEXIUM) 40 MG capsule Take 40 mg by mouth daily before supper.       Marland Kitchen FLUoxetine (PROZAC) 40 MG capsule Take 40 mg by mouth daily.        . Fluticasone-Salmeterol (ADVAIR DISKUS) 250-50 MCG/DOSE AEPB Inhale 1 puff into the lungs every 12 (twelve) hours.        . isosorbide mononitrate (IMDUR) 30 MG 24 hr tablet Take 30 mg by mouth daily.        Marland Kitchen loteprednol (LOTEMAX) 0.5 % ophthalmic suspension Place 1 drop into both eyes. One drop in both eye 1-2 times daily      . nitroGLYCERIN (NITROSTAT) 0.4 MG SL tablet Place 0.4 mg under the tongue every 5 (five) minutes as needed.        . rosuvastatin (CRESTOR) 10 MG tablet Take 10 mg by mouth once a week.       No current facility-administered medications on file prior to visit.   DATA REVIEWED  Laboratory Studies: [facility], external   07/2012 Lipid: cholesterol 180, triglycerides 144, HDL 59, LDL 95   CBC: Wbc 9.6, Rbc 3.85, Hgb 11.7, Hct 36.4,  Platelet 322   BUN 15, cr. .68   Urine bacteria few, protein 2+ Urine Culture negative  03/22/2013: 30 BC 7.3, hemoglobin 12.4, hematocrit 37.2, platelets 3:15.   Glucose 90, BUN 18, creatinine 0.72, sodium 139, potassium 4.8. Proteins/LFTs to be WNL   TSH 1.05   Review of Systems  DATA OBTAINED: from patient GENERAL: Feels well   No fevers, fatigue, change in appetite or weight SKIN: No itch, rash or open wounds EYES: No eye pain, dryness or itching  Long standing poor vision (macular degeneration/ complications of herpes zoster) EARS: Left earache  NOSE: No congestion, drainage or bleeding MOUTH/THROAT: No mouth or tooth pain  No sore throat No difficulty chewing or swallowing RESPIRATORY: chronic cough, no change. No wheezing, SOB CARDIAC: No chest pain, palpitations  No edema. GI: No abdominal pain  No N/V/D or constipation  No heartburn or reflux  GU: No dysuria, frequency or urgency  No change in urine volume or character    MUSCULOSKELETAL: No joint pain, swelling or stiffness  No back pain  No muscle ache, pain, weakness  Gait is steady  No recent falls.  NEUROLOGIC: No dizziness, fainting, headache,   No change in mental status.  PSYCHIATRIC: No feelings of anxiety, depression Sleeps well.    Physical Exam Filed Vitals:   06/29/13 1531  BP: 150/62  Pulse: 68  Height: 4' 10.6" (1.488 m)  Weight: 146 lb (66.225 kg)   Body mass index is 29.91 kg/(m^2).  GENERAL APPEARANCE: No acute distress, appropriately groomed, normal body habitus. Alert, pleasant, conversant. SKIN: No diaphoresis rash, unusual lesions, wounds HEAD: Normocephalic, atraumatic EYES: Conjunctiva/lids clear. Left cornea cloudy  EARS: External exam WNL, right canal w/ excess cerumen, rt. TM not visible. Left canal clear, TM WNL. Hearing decreased. NOSE: No deformity or discharge. MOUTH/THROAT: Lips w/o lesions. Oral mucosa, tongue moist, w/o lesion. Oropharynx w/o redness or lesions.  NECK: Supple, full  ROM. No thyroid tenderness, enlargement or nodule LYMPHATICS: No head, neck or supraclavicular adenopathy RESPIRATORY: Breathing is even, unlabored. Lung sounds are clear and full.  CARDIOVASCULAR: Heart RRR. No murmur or extra heart sounds  EDEMA: No peripheral  edema.  GASTROINTESTINAL: Abdomen is soft, non-tender, not distended w/ normal bowel sounds.   MUSCULOSKELETAL: Moves all extremities with full ROM, strength and tone. Back with kyphosis, scoliosis No spinal process tenderness. Gait is steady, slow w/ walker NEUROLOGIC: Oriented to time, place, person. Cranial nerves 2-12 grossly intact, speech clear, no tremor.  PSYCHIATRIC: Mood and affect appropriate to situation  ASSESSMENT/PLAN  Hypertension Blood pressure remains stable, and symptoms improved. Continue current medications monitor labs at intervals.  Excessive cerumen in right ear canal Excessive cerumen right ear contributing to 18. Lavaged with warm water today moderate amount of cerumen removed. Canals clear, TM intact.   Follow up: 3 months Annual update/exam  Ernie Sagrero T.Berneta Sconyers, NP-C 06/29/2013

## 2013-07-06 ENCOUNTER — Other Ambulatory Visit: Payer: Self-pay | Admitting: Geriatric Medicine

## 2013-07-06 DIAGNOSIS — Z66 Do not resuscitate: Secondary | ICD-10-CM

## 2013-07-06 NOTE — Assessment & Plan Note (Signed)
Blood pressure remains stable, and symptoms improved. Continue current medications monitor labs at intervals.

## 2013-07-06 NOTE — Assessment & Plan Note (Signed)
Excessive cerumen right ear contributing to 18. Lavaged with warm water today moderate amount of cerumen removed. Canals clear, TM intact.

## 2013-07-18 ENCOUNTER — Other Ambulatory Visit: Payer: Self-pay | Admitting: Geriatric Medicine

## 2013-07-19 ENCOUNTER — Other Ambulatory Visit: Payer: Self-pay | Admitting: Geriatric Medicine

## 2013-07-20 ENCOUNTER — Other Ambulatory Visit: Payer: Self-pay | Admitting: Geriatric Medicine

## 2013-08-04 DIAGNOSIS — H18429 Band keratopathy, unspecified eye: Secondary | ICD-10-CM | POA: Diagnosis not present

## 2013-08-04 DIAGNOSIS — Z961 Presence of intraocular lens: Secondary | ICD-10-CM | POA: Diagnosis not present

## 2013-08-04 DIAGNOSIS — H353290 Exudative age-related macular degeneration, unspecified eye, stage unspecified: Secondary | ICD-10-CM | POA: Diagnosis not present

## 2013-08-18 ENCOUNTER — Other Ambulatory Visit: Payer: Self-pay | Admitting: *Deleted

## 2013-08-18 MED ORDER — FLUOXETINE HCL 20 MG PO CAPS: ORAL_CAPSULE | ORAL | Status: DC

## 2013-08-29 DIAGNOSIS — K219 Gastro-esophageal reflux disease without esophagitis: Secondary | ICD-10-CM | POA: Diagnosis not present

## 2013-09-13 DIAGNOSIS — N8182 Incompetence or weakening of pubocervical tissue: Secondary | ICD-10-CM | POA: Diagnosis not present

## 2013-09-14 ENCOUNTER — Telehealth: Payer: Self-pay

## 2013-09-14 NOTE — Telephone Encounter (Signed)
Patient had been to GYN yesterday and told her that she had been weak and shaky, has appointment with Claudette 09/28/13, the doctor suggested that she was seen sooner. But the patient doesn't think she needs to. I asked if she was weak and shaky today, she is not and wasn't yesterday. I asked out of 7 days how many days was she, only 2. I told her I would check with Claudette. Called patient back, we will wait until her appt, but if she gets worse or feels she needs to be seen sooner call us back. Kaylyn Layer, CMA

## 2013-09-19 ENCOUNTER — Ambulatory Visit: Payer: Medicare Other | Admitting: Diagnostic Neuroimaging

## 2013-09-22 DIAGNOSIS — I2510 Atherosclerotic heart disease of native coronary artery without angina pectoris: Secondary | ICD-10-CM | POA: Diagnosis not present

## 2013-09-22 DIAGNOSIS — IMO0002 Reserved for concepts with insufficient information to code with codable children: Secondary | ICD-10-CM | POA: Diagnosis not present

## 2013-09-22 DIAGNOSIS — I10 Essential (primary) hypertension: Secondary | ICD-10-CM | POA: Diagnosis not present

## 2013-09-22 LAB — BASIC METABOLIC PANEL
BUN: 18 mg/dL (ref 4–21)
Creatinine: 0.8 mg/dL (ref 0.5–1.1)
Glucose: 99 mg/dL
POTASSIUM: 4.7 mmol/L (ref 3.4–5.3)
SODIUM: 137 mmol/L (ref 137–147)

## 2013-09-22 LAB — TSH: TSH: 1.12 u[IU]/mL (ref 0.41–5.90)

## 2013-09-22 LAB — LIPID PANEL
CHOLESTEROL: 180 mg/dL (ref 0–200)
HDL: 55 mg/dL (ref 35–70)
LDL Cholesterol: 92 mg/dL
TRIGLYCERIDES: 167 mg/dL — AB (ref 40–160)

## 2013-09-22 LAB — CBC AND DIFFERENTIAL
HCT: 38 % (ref 36–46)
HEMOGLOBIN: 12.6 g/dL (ref 12.0–16.0)
Platelets: 347 10*3/uL (ref 150–399)
WBC: 8.7 10^3/mL

## 2013-09-26 DIAGNOSIS — Z23 Encounter for immunization: Secondary | ICD-10-CM | POA: Diagnosis not present

## 2013-09-28 ENCOUNTER — Non-Acute Institutional Stay: Payer: Medicare Other | Admitting: Geriatric Medicine

## 2013-09-28 ENCOUNTER — Encounter: Payer: Self-pay | Admitting: Geriatric Medicine

## 2013-09-28 VITALS — BP 126/76 | HR 72 | Ht 59.0 in | Wt 145.0 lb

## 2013-09-28 DIAGNOSIS — M1711 Unilateral primary osteoarthritis, right knee: Secondary | ICD-10-CM

## 2013-09-28 DIAGNOSIS — F329 Major depressive disorder, single episode, unspecified: Secondary | ICD-10-CM

## 2013-09-28 DIAGNOSIS — M1710 Unilateral primary osteoarthritis, unspecified knee: Secondary | ICD-10-CM | POA: Diagnosis not present

## 2013-09-28 DIAGNOSIS — I2510 Atherosclerotic heart disease of native coronary artery without angina pectoris: Secondary | ICD-10-CM

## 2013-09-28 DIAGNOSIS — I10 Essential (primary) hypertension: Secondary | ICD-10-CM | POA: Diagnosis not present

## 2013-09-28 NOTE — Progress Notes (Signed)
Patient ID: Sonya [name], female   DOB: October 01, 1913, 79 y.o.   MRN: [id number] [facility] [phone])  Chief Complaint  Patient presents with  . Medical Managment of Chronic Issues    Comprehensive exam: blood pressure, depression    HPI: This is a 79 y.o. female resident of [facility], Independent Living section. This patient has not had a hospitalization, serious acute illness or injury in the last year. Patient reports she generally feels good except for some 'weakness'; her knees shake a bit when walking. Feels very well when she wakes up in the morning weak feeling starts after breakfast/pills. Takes all her pills in the morning. No shakiness after dinner. Has not had any falls, uses her walker for all ambulation. Does note some general decreased energy; it takes her longer to do things. She is having no difficulty with basic ADLs, fixes her own breakfast and snacks at home. Eats evening meal in dining room with friends  4 evenings/ week , other evenings meal is delivered.  Poor vision continues to be a problem for this patient, she continues to follow with Dr. Gwendalyn Ege for macular degeneration, last visit was about 2 months ago.  Patient does not have her blood pressure checked outside the office, recent Lab studies are satisfactory. She has not had any chest pain or other signs of worsening cardiac disease, continues her medications as prescribed.  Most recent lipid panel well within normal limits.   Allergies  Allergen Reactions  . Plavix [Clopidogrel] Nausea And Vomiting  . Prednisone   . Statins       Medication List       This list is accurate as of: 09/28/13 11:20 AM.  Always use your most recent med list.               ADVAIR DISKUS 250-50 MCG/DOSE Aepb  Generic drug:  Fluticasone-Salmeterol  Inhale 1 puff into the lungs every 12 (twelve) hours.     aspirin 81 MG tablet  Take 81 mg by mouth daily.     beta carotene w/minerals  tablet  Take 1 tablet by mouth daily.     FLUoxetine 20 MG capsule  Commonly known as:  PROZAC  Take one capsule once each morning     isosorbide mononitrate 30 MG 24 hr tablet  Commonly known as:  IMDUR  Take 30 mg by mouth daily. Take at bedtime     loteprednol 0.5 % ophthalmic suspension  Commonly known as:  LOTEMAX  Place 1 drop into the left eye. One drop in left eye 1-2 times daily as needed     metoprolol succinate 25 MG 24 hr tablet  Commonly known as:  TOPROL-XL  Take 25 mg by mouth daily. Take 1/2 tablet twice daily for blood pressure     nitroGLYCERIN 0.4 MG SL tablet  Commonly known as:  NITROSTAT  Place 0.4 mg under the tongue every 5 (five) minutes as needed.        DATA REVIEWED  Radiologic Exams   Laboratory Studies   [facility], external   07/2012 Lipid: cholesterol 180, triglycerides 144, HDL 59, LDL 95   CBC: Wbc 9.6, Rbc 3.85, Hgb 11.7, Hct 36.4, Platelet 322   BUN 15, cr. .68   Urine bacteria few, protein 2+ Urine Culture negative  03/22/2013: WBC 7.3, hemoglobin 12.4, hematocrit 37.2, platelets 3:15.   Glucose 90, BUN 18, creatinine 0.72, sodium 139, potassium 4.8. Proteins/LFTs to be WNL   TSH 1.05  09/22/2013 WBC 8.7, hemoglobin 12.6, hematocrit 37.9, platelets 347.   Glucose 99, BUN 18, creatinine 0.81, sodium 137, potassium 4.7. LFTs/proteins WNL.  Total cholesterol 180, triglycerides 167, HDL 55, LDL 92  TSH 1.12  Cardiovascular Exams  Past Medical History  Diagnosis Date  . Acute bronchitis   . Asthma   . TIA (transient ischemic attack)   . Herpes zoster complication 1993    complicated, left eye involvement  . Nontoxic uninodular goiter     negative [place] 2008  . Macular degeneration (senile) of retina, unspecified 2012    wet  . Acute myocardial infarction, unspecified site, episode of care unspecified 03/07/11  . Coronary atherosclerosis of native coronary artery      s/p stent diagonal branch 1996; RCA 03/07/11  . Chronic diastolic  heart failure   . Cardiomegaly   . Diaphragmatic hernia without mention of obstruction or gangrene   . Irritable bowel syndrome   . Other specified genital prolapse(618.89)     corrected with pessary  . Edema 2006  . Other abnormal blood chemistry   . Debility, unspecified   . Open wound of knee, leg (except thigh), and ankle, without mention of complication 09/2012    left leg, complicated wound, extended healing, resolved 01/2013  . Transient ischemic attack (TIA), and cerebral infarction without residual deficits(V12.54) 2008  . Shoulder pain 03/30/2013  . Hypertension   . Depression    Past Surgical History  Procedure Laterality Date  . Appendectomy    . Tonsillectomy and adenoidectomy    . Cataract extraction    . Cardiac catheterization  1996, 2001, 2012  . Carotid stent      diagonal branch of LCA   Family Status  Relation Status Death Age  . Sister Deceased 67  . Mother Deceased 37  . Father Deceased 29  . Sister Alive   . Sister Alive    History   Social History Narrative      Resides at [facility] since 2009. Retired Acupuncturist    Widowed with no children.    Living Will, DNR   Exercises predominantly walking about a mile daily   Does not drive   Caffeine beverages occasional   Stopped smoking 1965   1/2 glass of wine almost daily     Review of Systems  DATA OBTAINED: from patient GENERAL: Feels well   No fevers, tires easily, no change in appetite or weight SKIN: No itch, rash or open wounds EYES: No eye pain, dryness or itching  Long standing poor vision (macular degeneration/ complications of herpes zoster) EARS: No earache, tinnitus, change in hearing NOSE: No congestion, drainage or bleeding MOUTH/THROAT: No mouth or tooth pain  No sore throat No difficulty chewing or swallowing. Occasionally chokes on water at dinner table RESPIRATORY: No cough, wheezing, SOB. Increased Cough a few weeks ago, better now (allergies?) CARDIAC: No chest  pain, palpitations  No edema. CHEST/BREASTS: Left breast discomfort sometimes. No discharge or lumps in breasts GI: No abdominal pain  No N/V/D or constipation  No heartburn or reflux  GU: No dysuria, frequency. Some urinary urgency/incontinence (if I wait to long), sometimes "white disks" in toilet water  No change in urine volume.  Nocturia 1-3x. Pessary checked at GYN exam 2 weeks ago  MUSCULOSKELETAL: Rt knee discomfort some days. No swelling or stiffness  Occasional back pain  No muscle ache, pain.  Gait is unsteady "I need the walker"  No recent falls.  NEUROLOGIC: No dizziness, fainting,  headache,  No change in mental status.  PSYCHIATRIC: No feelings of anxiety, depression Sleeps well.   Physical Exam Filed Vitals:   09/28/13 1025  BP: 126/76  Pulse: 72  Height: 4\' 11"  (1.499 m)  Weight: 145 lb (65.772 kg)   Body mass index is 29.27 kg/(m^2).  GENERAL APPEARANCE: No acute distress, appropriately groomed, Mildly Overweight body habitus. Alert, pleasant, conversant. SKIN: No diaphoresis, rash, unusual lesions, wounds HEAD: Normocephalic, atraumatic EYES: Conjunctiva/lids clear. Left cornea cloudy  EARS: External exam WNL,  Canals clear, TM WNL. Hearing decreased. NOSE: No deformity or discharge. MOUTH/THROAT: Lips w/o lesions. Oral mucosa, tongue moist, w/o lesion. Oropharynx w/o redness or lesions.  NECK: Supple, full ROM. No thyroid tenderness, enlargement or nodule LYMPHATICS: No head, neck or supraclavicular adenopathy RESPIRATORY: Breathing is even, unlabored. Lung sounds are clear and full.  CARDIOVASCULAR: Heart RRR. No murmur or extra heart sounds  ARTERIAL: No carotid bruit. Carotid 2+. Rt DP 2+, left DP 1+  VENOUS: No varicosities. No venous stasis skin changes  EDEMA: No peripheral or periorbital edema.  GASTROINTESTINAL: Abdomen is soft, non-tender, not distended w/ normal bowel sounds. No hepatic or splenic enlargement. No mass, ventral or inguinal hernia. Well  healed lower midline scar. GENITOURINARY: Bladder non tender, not distended. (GYN exam 2 months ago re: pessary) MUSCULOSKELETAL: Moves all extremities with full ROM, strength and tone. Rt. Knee crepitus evident, no swelling or tenderness. Back with cervical kyphosis,  No scoliosis or spinal process tenderness. Gait is steady with walker NEUROLOGIC: Oriented to time, place, person. Cranial nerves 2-12 grossly intact, speech clear, no tremor. Patella, brachial DTR 1+. PSYCHIATRIC: Mood and affect appropriate to situation   ASSESSMENT/PLAN  Hypertension Blood pressure stable today,  recent laboratory studies satisfactory. Morning weakness may have something to do with her medications. She takes both Toprol-XL and Imdur at the same time will change Imdur dosing to bedtime  CAD (coronary artery disease) No recent chest pain, continues Imdur daily (see above for dose timing change). Most recent lipid panel within normal limits, will stop Crestor as this medication likely is not adding benefit in this 79 yo woman. Patient will follow with Dr. Anne Fu as scheduled  Osteoarthritis of right knee Pain with ambulation, crepitus on exam. Most likely osteoarthritis and this 79 year old woman. No swelling or gross instability of the joint. Recommend she continues to use walker for assistance with ambulation. May use Tylenol for pain relief if needed  Depression Patient continues on 20 mg dose of fluoxetine. She reports no depressive symptoms; appetite, sleep are stable. Social activities are limited, but pt. Denies feelings of loneliness. No adverse effects related to this medication.   Follow up: 3 months   Tonette Koehne T.Alianis Trimmer, NP-C 09/28/2013

## 2013-10-01 ENCOUNTER — Encounter: Payer: Self-pay | Admitting: Geriatric Medicine

## 2013-10-01 DIAGNOSIS — M1711 Unilateral primary osteoarthritis, right knee: Secondary | ICD-10-CM | POA: Insufficient documentation

## 2013-10-01 NOTE — Assessment & Plan Note (Signed)
Patient continues on 20 mg dose of fluoxetine. She reports no depressive symptoms; appetite, sleep are stable. Social activities are limited, but pt. Denies feelings of loneliness. No adverse effects related to this medication.

## 2013-10-01 NOTE — Assessment & Plan Note (Addendum)
No recent chest pain, continues Imdur daily (see above for dose timing change). Most recent lipid panel within normal limits, will stop Crestor as this medication likely is not adding benefit in this 79 yo woman. Patient will follow with Dr. Anne Fu as scheduled

## 2013-10-01 NOTE — Assessment & Plan Note (Signed)
Blood pressure stable today,  recent laboratory studies satisfactory. Morning weakness may have something to do with her medications. She takes both Toprol-XL and Imdur at the same time will change Imdur dosing to bedtime

## 2013-10-01 NOTE — Assessment & Plan Note (Signed)
Pain with ambulation, crepitus on exam. Most likely osteoarthritis and this 79 year old woman. No swelling or gross instability of the joint. Recommend she continues to use walker for assistance with ambulation. May use Tylenol for pain relief if needed

## 2013-10-21 ENCOUNTER — Encounter: Payer: Self-pay | Admitting: Internal Medicine

## 2013-10-27 DIAGNOSIS — H353290 Exudative age-related macular degeneration, unspecified eye, stage unspecified: Secondary | ICD-10-CM | POA: Diagnosis not present

## 2013-10-27 DIAGNOSIS — H18429 Band keratopathy, unspecified eye: Secondary | ICD-10-CM | POA: Diagnosis not present

## 2013-10-27 DIAGNOSIS — Z961 Presence of intraocular lens: Secondary | ICD-10-CM | POA: Diagnosis not present

## 2013-11-01 DIAGNOSIS — L821 Other seborrheic keratosis: Secondary | ICD-10-CM | POA: Diagnosis not present

## 2013-11-01 DIAGNOSIS — Z85828 Personal history of other malignant neoplasm of skin: Secondary | ICD-10-CM | POA: Diagnosis not present

## 2013-11-01 DIAGNOSIS — M7981 Nontraumatic hematoma of soft tissue: Secondary | ICD-10-CM | POA: Diagnosis not present

## 2013-11-01 DIAGNOSIS — L820 Inflamed seborrheic keratosis: Secondary | ICD-10-CM | POA: Diagnosis not present

## 2013-11-01 DIAGNOSIS — L723 Sebaceous cyst: Secondary | ICD-10-CM | POA: Diagnosis not present

## 2013-11-01 DIAGNOSIS — L570 Actinic keratosis: Secondary | ICD-10-CM | POA: Diagnosis not present

## 2013-11-25 ENCOUNTER — Ambulatory Visit (INDEPENDENT_AMBULATORY_CARE_PROVIDER_SITE_OTHER): Payer: Medicare Other | Admitting: Cardiology

## 2013-11-25 ENCOUNTER — Encounter: Payer: Self-pay | Admitting: Cardiology

## 2013-11-25 VITALS — BP 148/72 | HR 63 | Ht 59.0 in | Wt 146.1 lb

## 2013-11-25 DIAGNOSIS — I2510 Atherosclerotic heart disease of native coronary artery without angina pectoris: Secondary | ICD-10-CM

## 2013-11-25 DIAGNOSIS — I10 Essential (primary) hypertension: Secondary | ICD-10-CM

## 2013-11-25 NOTE — Patient Instructions (Addendum)
Your physician recommends that you continue on your current medications as directed. Please refer to the Current Medication list given to you today.  Disability Parking Placement form provided to patient.  Your physician wants you to follow-up in: 6 months with Dr. Anne Fu. You will receive a reminder letter in the mail two months in advance. If you don't receive a letter, please call our office to schedule the follow-up appointment.

## 2013-11-25 NOTE — Progress Notes (Signed)
[address], [address] [place], [place]  [address] Phone: [phone] Fax:  [phone]  Date:  11/25/2013   ID:  Sonya [name], DOB March 22, 1913, MRN [id number]  PCP:  Kimber Relic, MD   History of Present Illness: Sonya [name] is a 79 y.o. female with coronary artery disease status post ST elevation myocardial infarction receiving an RCA stent with residual moderate LAD disease and obtuse marginal disease here for followup.  CAD-stable with minor exertional anginal symptoms. She will occasionally have fleeting chest discomfort, this is usually relieved by burping. In the past her chest discomfort post catheterization/stent placement was relieved with Nexium.   Hyperlipidemia-in the past, she has not tolerated statins due to leg weakness. In review of her records, her LDL was 135. I had started once a week Crestor 10 mg but this was discontinued by her primary.    GERD-doing well with Nexium.   Unfortunately suffered a fall with her left leg, required stitches. Elevating left leg. Erythema associated with wound. Mild edema. She is being closely monitored. Healed now.   Using walker. Lab work reviewed, hemoglobin now low normal. She has stopped her iron supplementation    Wt Readings from Last 3 Encounters:  11/25/13 146 lb 1.9 oz (66.28 kg)  09/28/13 145 lb (65.772 kg)  06/29/13 146 lb (66.225 kg)     Past Medical History  Diagnosis Date  . Acute bronchitis   . Asthma   . TIA (transient ischemic attack)   . Herpes zoster complication 1993    complicated, left eye involvement  . Nontoxic uninodular goiter     negative [place] 2008  . Macular degeneration (senile) of retina, unspecified 2012    wet  . Acute myocardial infarction, unspecified site, episode of care unspecified 03/07/11  . Coronary atherosclerosis of native coronary artery      s/p stent diagonal branch 1996; RCA 03/07/11  . Chronic diastolic heart failure   . Cardiomegaly   . Diaphragmatic  hernia without mention of obstruction or gangrene   . Irritable bowel syndrome   . Other specified genital prolapse(618.89)     corrected with pessary  . Edema 2006  . Other abnormal blood chemistry   . Debility, unspecified   . Open wound of knee, leg (except thigh), and ankle, without mention of complication 09/2012    left leg, complicated wound, extended healing, resolved 01/2013  . Transient ischemic attack (TIA), and cerebral infarction without residual deficits(V12.54) 2008  . Shoulder pain 03/30/2013  . Hypertension   . Depression     Past Surgical History  Procedure Laterality Date  . Appendectomy    . Tonsillectomy and adenoidectomy    . Cataract extraction    . Cardiac catheterization  1996, 2001, 2012  . Carotid stent      diagonal branch of LCA    Current Outpatient Prescriptions  Medication Sig Dispense Refill  . aspirin 81 MG tablet Take 81 mg by mouth daily.      Marland Kitchen FLUoxetine (PROZAC) 20 MG capsule Take one capsule once each morning  30 capsule  3  . Fluticasone-Salmeterol (ADVAIR DISKUS) 250-50 MCG/DOSE AEPB Inhale 1 puff into the lungs every 12 (twelve) hours.        . isosorbide mononitrate (IMDUR) 30 MG 24 hr tablet Take 30 mg by mouth daily. Take at bedtime      . loteprednol (LOTEMAX) 0.5 % ophthalmic suspension Place 1 drop into the left eye. One  drop in left eye 1-2 times daily as needed      . metoprolol succinate (TOPROL-XL) 25 MG 24 hr tablet Take 25 mg by mouth daily.       Marland Kitchen NEXIUM 40 MG capsule Take 1 capsule by mouth daily.      . nitroGLYCERIN (NITROSTAT) 0.4 MG SL tablet Place 0.4 mg under the tongue every 5 (five) minutes as needed.         No current facility-administered medications for this visit.    Allergies:    Allergies  Allergen Reactions  . Plavix [Clopidogrel] Nausea And Vomiting  . Statins Other (See Comments)    Leg cramps    Social History:  The patient  reports that she quit smoking about 49 years ago. She has never used  smokeless tobacco. She reports that she drinks about 3.0 ounces of alcohol per week. She reports that she does not use illicit drugs.   ROS:  Please see the history of present illness.   Denies any fevers, chills. Recent leg wound. Occasional rare chest pain.   PHYSICAL EXAM: VS:  BP 148/72  Pulse 63  Ht 4\' 11"  (1.499 m)  Wt 146 lb 1.9 oz (66.28 kg)  BMI 29.50 kg/m2 Well nourished, well developed, in no acute distress elderly, HEENT: normal Neck: no JVD Cardiac:  normal S1, S2; RRR; no murmur Lungs:  clear to auscultation bilaterally, no wheezing, rhonchi or rales Abd: soft, nontender, no hepatomegaly Ext: no edema Skin: right leg mild redness. Small bandage noted. Mild warmth. Neuro: no focal abnormalities noted  EKG:  Normal rhythm 63, T wave inversion inferior leads mostly but scattered throughout EKG.     ASSESSMENT AND PLAN:  1. Coronary artery disease status post MI-overall doing well. Minor stable angina. Continue with isosorbide. Aspirin. She has been taken off of her Crestor. Lipids have been excellent. Given her advanced age, had no problems with that. 2. Right leg injury-I've asked her to have this evaluated by her primary. Slightly warm, red. Continue to monitor. Possible cellulitis. 3. See her back in 6 months  Signed, Donato Schultz, MD [facility]  11/25/2013 12:07 PM

## 2013-11-30 ENCOUNTER — Encounter: Payer: Self-pay | Admitting: Geriatric Medicine

## 2013-11-30 ENCOUNTER — Non-Acute Institutional Stay: Payer: Medicare Other | Admitting: Geriatric Medicine

## 2013-11-30 DIAGNOSIS — S81009A Unspecified open wound, unspecified knee, initial encounter: Secondary | ICD-10-CM | POA: Diagnosis not present

## 2013-11-30 NOTE — Progress Notes (Signed)
Patient ID: KELCEE BJORN, female   DOB: 1913-01-05, 79 y.o.   MRN: [id number]   [facility] [phone])   Chief Complaint  Patient presents with  . Wound Check    right leg    HPI: This is a 79 y.o. female resident of [facility]  section.  Evaluation is requested today to evaluate leg wound. Patient tells me she dropped a book on her leg several weeks ago. Describes a wound that sounds like a large hematoma that broke open and there was a superficial wound underneath. Over the last few days the wound is very dry however her leg is swollen and a little tender.    Allergies  Allergen Reactions  . Plavix [Clopidogrel] Nausea And Vomiting  . Statins Other (See Comments)    Leg cramps   Medications    Medication List       This list is accurate as of: 11/30/13  2:41 PM.  Always use your most recent med list.               ADVAIR DISKUS 250-50 MCG/DOSE Aepb  Generic drug:  Fluticasone-Salmeterol  Inhale 1 puff into the lungs every 12 (twelve) hours.     aspirin 81 MG tablet  Take 81 mg by mouth daily.     FLUoxetine 20 MG capsule  Commonly known as:  PROZAC  Take one capsule once each morning     isosorbide mononitrate 30 MG 24 hr tablet  Commonly known as:  IMDUR  Take 30 mg by mouth daily. Take at bedtime     loteprednol 0.5 % ophthalmic suspension  Commonly known as:  LOTEMAX  Place 1 drop into the left eye. One drop in left eye 1-2 times daily as needed     metoprolol succinate 25 MG 24 hr tablet  Commonly known as:  TOPROL-XL  Take 25 mg by mouth daily.     NEXIUM 40 MG capsule  Generic drug:  esomeprazole  Take 1 capsule by mouth daily.     nitroGLYCERIN 0.4 MG SL tablet  Commonly known as:  NITROSTAT  Place 0.4 mg under the tongue every 5 (five) minutes as needed.        Review of Systems  DATA OBTAINED: from patient,  GENERAL: Feels well   No fevers, fatigue, change in appetite or  weight SKIN: Right lower leg is sore RESPIRATORY: No cough, wheezing, SOB CARDIAC: No chest pain, palpitations  No edema. MUSCULOSKELETAL: No joint pain, swelling or stiffness  No back pain  No muscle ache, pain, weakness  Gait is unsteady  No recent falls.  NEUROLOGIC: No dizziness, fainting, headache,  No change in mental status.  PSYCHIATRIC: No feelings of anxiety, depression Sleeps well.     Physical Exam Filed Vitals:   11/30/13 1407  BP: 144/68  Pulse: 76  Temp: 96.7 F (35.9 C)  TempSrc: Oral  Weight: 148 lb (67.132 kg)   Body mass index is 29.88 kg/(m^2). GENERAL APPEARANCE: No acute distress, appropriately groomed, normal body habitus. Alert, pleasant, conversant. SKIN: No diaphoresis, rash.  Right lower leg with a dry wound just above the ankle approximately 0.5 inch in diameter. The wound is crusted with edges rolled in a bit. Lower leg is edematous, 1+ no significant erythema very mild tenderness. Wound cleaned today with soap water and gentle friction, crust is removed revealing a clean wound base with pink wound edge. Again the wound edges but rolled in. Covered with  Mepitel and gauze, wrap leg with cold and to help reduce edema HEAD: Normocephalic, atraumatic EYES: Conjunctiva/lids clear. EARS:  Hearing grossly normal. NOSE: No deformity or discharge. RESPIRATORY: Breathing is even, unlabored.  MUSCULOSKELETAL: Moves all extremities with full ROM, strength and tone.  Gait is unsteady, better with walker NEUROLOGIC: Oriented to time, place, person. Speech clear, no tremor.  PSYCHIATRIC: Mood and affect appropriate to situation  ASSESSMENT/PLAN  Open wound of knee, leg (except thigh), and ankle, without mention of complication Right lower extremity with superficial wound. Wound bed is clean today there is lower extremityedema which is slowing healing process.  No evidence of cellulitis today. Have cleaned and covered the wound,  wrapped leg with Coban. Clinic nurse and  wound care nurse will re-evaluate in 2days.   Follow up: AS scheduled or earlier if wound does not heal.  Chavis Tessler T.Author [place], NP-C 11/30/2013

## 2013-11-30 NOTE — Assessment & Plan Note (Signed)
Right lower extremity with superficial wound. Wound bed is clean today there is lower extremityedema which is slowing healing process.  No evidence of cellulitis today. Have cleaned and covered the wound,  wrapped leg with Coban. Clinic nurse and wound care nurse will re-evaluate in 2days.

## 2013-12-06 DIAGNOSIS — N8189 Other female genital prolapse: Secondary | ICD-10-CM | POA: Diagnosis not present

## 2013-12-23 ENCOUNTER — Other Ambulatory Visit: Payer: Self-pay | Admitting: Internal Medicine

## 2013-12-28 ENCOUNTER — Encounter: Payer: Self-pay | Admitting: Geriatric Medicine

## 2013-12-28 ENCOUNTER — Non-Acute Institutional Stay: Payer: Medicare Other | Admitting: Geriatric Medicine

## 2013-12-28 VITALS — BP 124/68 | HR 80 | Wt 147.0 lb

## 2013-12-28 DIAGNOSIS — R209 Unspecified disturbances of skin sensation: Secondary | ICD-10-CM

## 2013-12-28 DIAGNOSIS — R200 Anesthesia of skin: Secondary | ICD-10-CM

## 2013-12-28 DIAGNOSIS — I10 Essential (primary) hypertension: Secondary | ICD-10-CM | POA: Diagnosis not present

## 2013-12-28 DIAGNOSIS — R609 Edema, unspecified: Secondary | ICD-10-CM | POA: Diagnosis not present

## 2013-12-28 HISTORY — DX: Anesthesia of skin: R20.0

## 2013-12-28 NOTE — Progress Notes (Signed)
Patient ID: Sonya [name], female   DOB: 1913-11-03, 80 y.o.   MRN: [id number]  [facility] [phone])  Chief Complaint  Patient presents with  . Medical Managment of Chronic Issues    blood pressure, depression. c/o right lower leg swollen, worse at night    HPI: This is a 80 y.o. female resident of [place] [place]  evaluated today for management of ongoing medical issues.    Recent visits: Hypertension Blood pressure stable today,  recent laboratory studies satisfactory. Morning weakness may have something to do with her medications. She takes both Toprol-XL and Imdur at the same time will change Imdur dosing to bedtime  CAD (coronary artery disease) No recent chest pain, continues Imdur daily (see above for dose timing change). Most recent lipid panel within normal limits, will stop Crestor as this medication likely is not adding benefit in this 80 yo woman. Patient will follow with Dr. Marlou Porch as scheduled  Osteoarthritis of right knee Pain with ambulation, crepitus on exam. Most likely osteoarthritis and this 80 year old woman. No swelling or gross instability of the joint. Recommend she continues to use walker for assistance with ambulation. May use Tylenol for pain relief if needed  Depression Patient continues on 20 mg dose of fluoxetine. She reports no depressive symptoms; appetite, sleep are stable. Social activities are limited, but pt. Denies feelings of loneliness. No adverse effects related to this medication.  Open wound of knee, leg (except thigh), and ankle, without mention of complication Right lower extremity with superficial wound. Wound bed is clean today there is lower extremityedema which is slowing healing process.  No evidence of cellulitis today. Have cleaned and covered the wound,  wrapped leg with Coban. Clinic nurse and wound care nurse will re-evaluate in 2days.  Since last visit, the wound on the  patient's lower leg has healed. Edema of right lower leg continues, patient has not been wearing compression hose.  Patient reports she's developed numbness in her left thumb first and second fingers. She's noticed numbness for some time however over the last few weeks she has developed some weakness in the fingers does drop objects held in this hand if not careful.  Allergies  Allergen Reactions  . Plavix [Clopidogrel] Nausea And Vomiting  . Statins Other (See Comments)    Leg cramps      Medication List       This list is accurate as of: 12/28/13 11:15 AM.  Always use your most recent med list.               ADVAIR DISKUS 250-50 MCG/DOSE Aepb  Generic drug:  Fluticasone-Salmeterol  Inhale 1 puff into the lungs every 12 (twelve) hours.     aspirin 81 MG tablet  Take 81 mg by mouth daily.     FLUoxetine 20 MG capsule  Commonly known as:  PROZAC  TAKE 1 CAPSULE EACH MORNING.     isosorbide mononitrate 30 MG 24 hr tablet  Commonly known as:  IMDUR  Take 30 mg by mouth daily. Take at bedtime     loteprednol 0.5 % ophthalmic suspension  Commonly known as:  LOTEMAX  Place 1 drop into the left eye. One drop in left eye 1-2 times daily as needed     metoprolol succinate 25 MG 24 hr tablet  Commonly known as:  TOPROL-XL  Take 25 mg by mouth daily.     NEXIUM 40 MG capsule  Generic drug:  esomeprazole  Take 1 capsule by mouth daily.     nitroGLYCERIN 0.4 MG SL tablet  Commonly known as:  NITROSTAT  Place 0.4 mg under the tongue every 5 (five) minutes as needed.        DATA REVIEWED  Radiologic Exams   Laboratory Studies   [facility], external  03/22/2013: WBC 7.3, hemoglobin 12.4, hematocrit 37.2, platelets 3:15.   Glucose 90, BUN 18, creatinine 0.72, sodium 139, potassium 4.8. Proteins/LFTs to be WNL   TSH 1.05  09/22/2013 WBC 8.7, hemoglobin 12.6, hematocrit 37.9, platelets 347.   Glucose 99, BUN 18, creatinine 0.81, sodium 137, potassium 4.7. LFTs/proteins  WNL.  Total cholesterol 180, triglycerides 167, HDL 55, LDL 92  TSH 1.12  Cardiovascular Exams  REVIEW OF SYSTEMS DATA OBTAINED: from patient GENERAL: Feels well   No fevers, tires easily, no change in appetite or weight SKIN: No itch, rash or open wounds EYES: No eye pain, dryness or itching  Long standing poor vision (macular degeneration/ complications of herpes zoster) EARS: No earache, tinnitus, change in hearing NOSE: No congestion, drainage or bleeding MOUTH/THROAT: No mouth or tooth pain  No sore throat No difficulty chewing or swallowing. Occasionally chokes on water at dinner table RESPIRATORY: No cough, wheezing, SOB.  CARDIAC: No chest pain, palpitations  No edema. CHEST/BREASTS: Left breast discomfort sometimes. No discharge or lumps in breasts GI: No abdominal pain  No N/V/D or constipation  No heartburn or reflux  GU: No dysuria, frequency. Some urinary urgency/incontinence (if I wait to long),No change in urine volume.  Nocturia 1-3x.   MUSCULOSKELETAL: Rt knee discomfort some days. No swelling or stiffness  Occasional back pain  No muscle ache, pain.  Gait is unsteady "I need the walker"  No recent falls.  NEUROLOGIC: No dizziness, fainting, headache,  No change in mental status. Numbness, weakness left thumb and first 2 fingers PSYCHIATRIC: No feelings of anxiety, depression Sleeps well.   Physical Exam Filed Vitals:   12/28/13 1059  BP: 124/68  Pulse: 80  Weight: 147 lb (66.679 kg)  SpO2: 95%   Body mass index is 29.67 kg/(m^2).  GENERAL APPEARANCE: No acute distress, appropriately groomed, Mildly Overweight body habitus. Alert, pleasant, conversant. SKIN: No diaphoresis, rash, unusual lesions, wounds HEAD: Normocephalic, atraumatic EYES: Conjunctiva/lids clear. Left cornea cloudy  EARS: External exam WNL,  Canals clear, TM WNL. Hearing decreased. NOSE: No deformity or discharge. MOUTH/THROAT: Lips w/o lesions. Oral mucosa, tongue moist, w/o lesion.  Oropharynx w/o redness or lesions.  NECK: Supple, full ROM. No thyroid tenderness, enlargement or nodule LYMPHATICS: No head, neck or supraclavicular adenopathy RESPIRATORY: Breathing is even, unlabored. Lung sounds are clear and full.  CARDIOVASCULAR: Heart RRR. No murmur or extra heart sounds  EDEMA: Right lower extremity 1+ edema, trace edema left lower extremity GASTROINTESTINAL: Abdomen is soft, non-tender, not distended w/ normal bowel sounds.  MUSCULOSKELETAL: Moves all extremities with full ROM, strength and tone. Rt. Knee crepitus evident, no swelling or tenderness. Back with cervical kyphosis,  No scoliosis or spinal process tenderness. Gait is steady with walker NEUROLOGIC: Oriented to time, place, person. Cranial nerves 2-12 grossly intact, speech clear, no tremor.  Mild weakness left handgrip, 2+ brachial and radial pulse PSYCHIATRIC: Mood and affect appropriate to situation   ASSESSMENT/PLAN  Hypertension Blood pressure remained stable, continue current medication.  Edema Mild bilateral lower extremity edema, right worse than left. Have recommended use of compression hose.   Finger numbness Finger numbness and some mild weakness in left hand. Symptoms consistent with carpal  tunnel inflammation/irritation. Have instructed patient on some wrist stretching and range of motion exercises. If no improvement she would like neurology evaluation   Follow up: 3 months   Lab 03/21/2014: BMP Yonah Tangeman T.Torria Fromer, NP-C 12/28/2013

## 2013-12-28 NOTE — Assessment & Plan Note (Signed)
Finger numbness and some mild weakness in left hand. Symptoms consistent with carpal tunnel inflammation/irritation. Have instructed patient on some wrist stretching and range of motion exercises. If no improvement she would like neurology evaluation

## 2013-12-28 NOTE — Assessment & Plan Note (Signed)
Blood pressure remained stable, continue current medication.

## 2013-12-28 NOTE — Assessment & Plan Note (Signed)
Mild bilateral lower extremity edema, right worse than left. Have recommended use of compression hose.

## 2014-01-13 DIAGNOSIS — Z961 Presence of intraocular lens: Secondary | ICD-10-CM | POA: Diagnosis not present

## 2014-01-13 DIAGNOSIS — H353290 Exudative age-related macular degeneration, unspecified eye, stage unspecified: Secondary | ICD-10-CM | POA: Diagnosis not present

## 2014-01-13 DIAGNOSIS — H18429 Band keratopathy, unspecified eye: Secondary | ICD-10-CM | POA: Diagnosis not present

## 2014-01-24 DIAGNOSIS — M7989 Other specified soft tissue disorders: Secondary | ICD-10-CM | POA: Diagnosis not present

## 2014-01-24 DIAGNOSIS — R209 Unspecified disturbances of skin sensation: Secondary | ICD-10-CM | POA: Diagnosis not present

## 2014-01-24 DIAGNOSIS — M79609 Pain in unspecified limb: Secondary | ICD-10-CM | POA: Diagnosis not present

## 2014-01-24 DIAGNOSIS — G5600 Carpal tunnel syndrome, unspecified upper limb: Secondary | ICD-10-CM | POA: Diagnosis not present

## 2014-01-25 ENCOUNTER — Other Ambulatory Visit (HOSPITAL_COMMUNITY): Payer: Self-pay | Admitting: Sports Medicine

## 2014-01-25 ENCOUNTER — Other Ambulatory Visit: Payer: Self-pay | Admitting: Cardiology

## 2014-01-25 ENCOUNTER — Ambulatory Visit (HOSPITAL_COMMUNITY)
Admission: RE | Admit: 2014-01-25 | Discharge: 2014-01-25 | Disposition: A | Discharge status: Home / Self Care | Payer: Medicare Other | Source: Ambulatory Visit | Attending: Cardiovascular Disease | Admitting: Cardiovascular Disease

## 2014-01-25 ENCOUNTER — Other Ambulatory Visit: Payer: Self-pay | Admitting: Geriatric Medicine

## 2014-01-25 DIAGNOSIS — M7989 Other specified soft tissue disorders: Secondary | ICD-10-CM | POA: Insufficient documentation

## 2014-01-25 DIAGNOSIS — M79609 Pain in unspecified limb: Secondary | ICD-10-CM | POA: Insufficient documentation

## 2014-01-25 NOTE — Progress Notes (Signed)
Bilateral Venous Duplex Lower Ext. Completed. Negative for DVT. Oda Cogan, BS, RDMS, RVT

## 2014-01-26 DIAGNOSIS — M2390 Unspecified internal derangement of unspecified knee: Secondary | ICD-10-CM | POA: Diagnosis not present

## 2014-01-26 DIAGNOSIS — M7989 Other specified soft tissue disorders: Secondary | ICD-10-CM | POA: Diagnosis not present

## 2014-01-26 DIAGNOSIS — M25569 Pain in unspecified knee: Secondary | ICD-10-CM | POA: Diagnosis not present

## 2014-01-26 DIAGNOSIS — M79609 Pain in unspecified limb: Secondary | ICD-10-CM | POA: Diagnosis not present

## 2014-01-26 NOTE — Telephone Encounter (Signed)
Can you clarify dose? The pharmacy is requesting different mg than what is on the office note. Thanks, MI

## 2014-01-30 ENCOUNTER — Other Ambulatory Visit: Payer: Self-pay | Admitting: Nurse Practitioner

## 2014-01-31 ENCOUNTER — Observation Stay (HOSPITAL_COMMUNITY): Payer: Medicare Other

## 2014-01-31 ENCOUNTER — Encounter (HOSPITAL_COMMUNITY): Payer: Self-pay | Admitting: Emergency Medicine

## 2014-01-31 ENCOUNTER — Emergency Department (HOSPITAL_COMMUNITY): Payer: Medicare Other

## 2014-01-31 ENCOUNTER — Inpatient Hospital Stay (HOSPITAL_COMMUNITY)
Admission: EM | Admit: 2014-01-31 | Discharge: 2014-02-04 | DRG: 065 | Disposition: A | Discharge status: Skilled Nursing Facility | Payer: Medicare Other | Attending: Internal Medicine | Admitting: Internal Medicine

## 2014-01-31 DIAGNOSIS — J45909 Unspecified asthma, uncomplicated: Secondary | ICD-10-CM | POA: Diagnosis present

## 2014-01-31 DIAGNOSIS — F32A Depression, unspecified: Secondary | ICD-10-CM

## 2014-01-31 DIAGNOSIS — I2510 Atherosclerotic heart disease of native coronary artery without angina pectoris: Secondary | ICD-10-CM | POA: Diagnosis not present

## 2014-01-31 DIAGNOSIS — R9431 Abnormal electrocardiogram [ECG] [EKG]: Secondary | ICD-10-CM | POA: Diagnosis not present

## 2014-01-31 DIAGNOSIS — I639 Cerebral infarction, unspecified: Secondary | ICD-10-CM

## 2014-01-31 DIAGNOSIS — R4701 Aphasia: Secondary | ICD-10-CM | POA: Diagnosis present

## 2014-01-31 DIAGNOSIS — I10 Essential (primary) hypertension: Secondary | ICD-10-CM | POA: Diagnosis present

## 2014-01-31 DIAGNOSIS — I6350 Cerebral infarction due to unspecified occlusion or stenosis of unspecified cerebral artery: Principal | ICD-10-CM | POA: Diagnosis present

## 2014-01-31 DIAGNOSIS — H6121 Impacted cerumen, right ear: Secondary | ICD-10-CM

## 2014-01-31 DIAGNOSIS — R1310 Dysphagia, unspecified: Secondary | ICD-10-CM

## 2014-01-31 DIAGNOSIS — R4702 Dysphasia: Secondary | ICD-10-CM | POA: Diagnosis present

## 2014-01-31 DIAGNOSIS — Z823 Family history of stroke: Secondary | ICD-10-CM

## 2014-01-31 DIAGNOSIS — R5381 Other malaise: Secondary | ICD-10-CM | POA: Diagnosis present

## 2014-01-31 DIAGNOSIS — R471 Dysarthria and anarthria: Secondary | ICD-10-CM | POA: Diagnosis present

## 2014-01-31 DIAGNOSIS — H6120 Impacted cerumen, unspecified ear: Secondary | ICD-10-CM

## 2014-01-31 DIAGNOSIS — F329 Major depressive disorder, single episode, unspecified: Secondary | ICD-10-CM | POA: Diagnosis present

## 2014-01-31 DIAGNOSIS — I5032 Chronic diastolic (congestive) heart failure: Secondary | ICD-10-CM

## 2014-01-31 DIAGNOSIS — Z833 Family history of diabetes mellitus: Secondary | ICD-10-CM

## 2014-01-31 DIAGNOSIS — J40 Bronchitis, not specified as acute or chronic: Secondary | ICD-10-CM | POA: Diagnosis not present

## 2014-01-31 DIAGNOSIS — R4789 Other speech disturbances: Secondary | ICD-10-CM | POA: Diagnosis not present

## 2014-01-31 DIAGNOSIS — R609 Edema, unspecified: Secondary | ICD-10-CM

## 2014-01-31 DIAGNOSIS — Z87891 Personal history of nicotine dependence: Secondary | ICD-10-CM

## 2014-01-31 DIAGNOSIS — H3530 Unspecified macular degeneration: Secondary | ICD-10-CM | POA: Diagnosis present

## 2014-01-31 DIAGNOSIS — I6789 Other cerebrovascular disease: Secondary | ICD-10-CM | POA: Diagnosis not present

## 2014-01-31 DIAGNOSIS — E785 Hyperlipidemia, unspecified: Secondary | ICD-10-CM | POA: Diagnosis present

## 2014-01-31 DIAGNOSIS — I252 Old myocardial infarction: Secondary | ICD-10-CM

## 2014-01-31 DIAGNOSIS — R339 Retention of urine, unspecified: Secondary | ICD-10-CM | POA: Diagnosis not present

## 2014-01-31 DIAGNOSIS — Z8673 Personal history of transient ischemic attack (TIA), and cerebral infarction without residual deficits: Secondary | ICD-10-CM

## 2014-01-31 DIAGNOSIS — Z66 Do not resuscitate: Secondary | ICD-10-CM | POA: Diagnosis present

## 2014-01-31 DIAGNOSIS — Z7982 Long term (current) use of aspirin: Secondary | ICD-10-CM

## 2014-01-31 DIAGNOSIS — F3289 Other specified depressive episodes: Secondary | ICD-10-CM | POA: Diagnosis present

## 2014-01-31 HISTORY — DX: Chronic diastolic (congestive) heart failure: I50.32

## 2014-01-31 LAB — CBC
HCT: 42.4 % (ref 36.0–46.0)
Hemoglobin: 14.1 g/dL (ref 12.0–15.0)
MCH: 30.3 pg (ref 26.0–34.0)
MCHC: 33.3 g/dL (ref 30.0–36.0)
MCV: 91 fL (ref 78.0–100.0)
Platelets: 356 10*3/uL (ref 150–400)
RBC: 4.66 MIL/uL (ref 3.87–5.11)
RDW: 13.5 % (ref 11.5–15.5)
WBC: 10.2 10*3/uL (ref 4.0–10.5)

## 2014-01-31 LAB — COMPREHENSIVE METABOLIC PANEL
ALK PHOS: 100 U/L (ref 39–117)
ALT: 19 U/L (ref 0–35)
AST: 19 U/L (ref 0–37)
Albumin: 3.7 g/dL (ref 3.5–5.2)
BUN: 22 mg/dL (ref 6–23)
CO2: 24 mEq/L (ref 19–32)
CREATININE: 0.71 mg/dL (ref 0.50–1.10)
Calcium: 8.9 mg/dL (ref 8.4–10.5)
Chloride: 104 mEq/L (ref 96–112)
GFR calc non Af Amer: 69 mL/min — ABNORMAL LOW (ref >90)
GFR, EST AFRICAN AMERICAN: 81 mL/min — AB (ref >90)
GLUCOSE: 100 mg/dL — AB (ref 70–99)
Potassium: 4.5 mEq/L (ref 3.7–5.3)
Sodium: 141 mEq/L (ref 137–147)
Total Bilirubin: 0.5 mg/dL (ref 0.3–1.2)
Total Protein: 7.6 g/dL (ref 6.0–8.3)

## 2014-01-31 LAB — URINALYSIS, ROUTINE W REFLEX MICROSCOPIC
Bilirubin Urine: NEGATIVE
Glucose, UA: NEGATIVE mg/dL
Hgb urine dipstick: NEGATIVE
KETONES UR: NEGATIVE mg/dL
LEUKOCYTES UA: NEGATIVE
NITRITE: NEGATIVE
PH: 7 (ref 5.0–8.0)
Protein, ur: 100 mg/dL — AB
Specific Gravity, Urine: 1.01 (ref 1.005–1.030)
Urobilinogen, UA: 0.2 mg/dL (ref 0.0–1.0)

## 2014-01-31 LAB — PROTIME-INR
INR: 0.97 (ref 0.00–1.49)
Prothrombin Time: 12.7 seconds (ref 11.6–15.2)

## 2014-01-31 LAB — GLUCOSE, CAPILLARY: Glucose-Capillary: 95 mg/dL (ref 70–99)

## 2014-01-31 LAB — URINE MICROSCOPIC-ADD ON

## 2014-01-31 LAB — DIFFERENTIAL
Basophils Absolute: 0 10*3/uL (ref 0.0–0.1)
Basophils Relative: 0 % (ref 0–1)
EOS PCT: 1 % (ref 0–5)
Eosinophils Absolute: 0.1 10*3/uL (ref 0.0–0.7)
LYMPHS ABS: 1.5 10*3/uL (ref 0.7–4.0)
Lymphocytes Relative: 14 % (ref 12–46)
MONO ABS: 0.9 10*3/uL (ref 0.1–1.0)
Monocytes Relative: 9 % (ref 3–12)
Neutro Abs: 7.8 10*3/uL — ABNORMAL HIGH (ref 1.7–7.7)
Neutrophils Relative %: 76 % (ref 43–77)

## 2014-01-31 LAB — APTT: aPTT: 26 seconds (ref 24–37)

## 2014-01-31 LAB — PRO B NATRIURETIC PEPTIDE: PRO B NATRI PEPTIDE: 1230 pg/mL — AB (ref 0–450)

## 2014-01-31 LAB — TROPONIN I: Troponin I: <0.3 ng/mL (ref <0.30)

## 2014-01-31 MED ORDER — LOTEPREDNOL ETABONATE 0.5 % OP SUSP
1.0000 [drp] | Freq: Two times a day (BID) | OPHTHALMIC | Status: DC | PRN
  Administered 2014-02-04: 1 [drp] via OPHTHALMIC
  Filled 2014-01-31: qty 5

## 2014-01-31 MED ORDER — FLUOXETINE HCL 20 MG PO CAPS
20.0000 mg | ORAL_CAPSULE | Freq: Every day | ORAL | Status: DC
  Administered 2014-02-01 – 2014-02-04 (×4): 20 mg via ORAL
  Filled 2014-01-31 (×5): qty 1

## 2014-01-31 MED ORDER — SODIUM CHLORIDE 0.9 % IV SOLN: INTRAVENOUS | Status: AC

## 2014-01-31 MED ORDER — ALBUTEROL SULFATE (2.5 MG/3ML) 0.083% IN NEBU: 3.0000 mL | INHALATION_SOLUTION | Freq: Four times a day (QID) | RESPIRATORY_TRACT | Status: DC | PRN

## 2014-01-31 MED ORDER — PANTOPRAZOLE SODIUM 40 MG PO TBEC
40.0000 mg | DELAYED_RELEASE_TABLET | Freq: Every day | ORAL | Status: DC
  Administered 2014-02-01 – 2014-02-04 (×4): 40 mg via ORAL
  Filled 2014-01-31 (×2): qty 1

## 2014-01-31 MED ORDER — ACETAMINOPHEN 325 MG PO TABS: 650.0000 mg | ORAL_TABLET | Freq: Four times a day (QID) | ORAL | Status: DC | PRN

## 2014-01-31 MED ORDER — NITROGLYCERIN 0.4 MG SL SUBL: 0.4000 mg | SUBLINGUAL_TABLET | SUBLINGUAL | Status: DC | PRN

## 2014-01-31 MED ORDER — ONDANSETRON HCL 4 MG/2ML IJ SOLN: 4.0000 mg | Freq: Three times a day (TID) | INTRAMUSCULAR | Status: DC | PRN

## 2014-01-31 MED ORDER — MOMETASONE FURO-FORMOTEROL FUM 100-5 MCG/ACT IN AERO
2.0000 | INHALATION_SPRAY | Freq: Two times a day (BID) | RESPIRATORY_TRACT | Status: DC
  Administered 2014-02-01 – 2014-02-04 (×6): 2 via RESPIRATORY_TRACT
  Filled 2014-01-31 (×2): qty 8.8

## 2014-01-31 MED ORDER — SENNOSIDES-DOCUSATE SODIUM 8.6-50 MG PO TABS: 1.0000 | ORAL_TABLET | Freq: Every evening | ORAL | Status: DC | PRN

## 2014-01-31 MED ORDER — ENOXAPARIN SODIUM 30 MG/0.3ML ~~LOC~~ SOLN
30.0000 mg | SUBCUTANEOUS | Status: DC
  Administered 2014-01-31 – 2014-02-03 (×3): 30 mg via SUBCUTANEOUS
  Filled 2014-01-31 (×5): qty 0.3

## 2014-01-31 MED ORDER — ASPIRIN 81 MG PO CHEW
81.0000 mg | CHEWABLE_TABLET | Freq: Every day | ORAL | Status: DC
  Administered 2014-02-01: 81 mg via ORAL
  Filled 2014-01-31: qty 1

## 2014-01-31 NOTE — ED Provider Notes (Signed)
CSN: [id number]     Arrival date & time 01/31/14  1048 History   First MD Initiated Contact with Patient 01/31/14 1050     Chief Complaint  Patient presents with  . Aphasia     HPI  Patient presents with dysphasia. Patient notes that her speech deficit began yesterday, after previously having no speech difficulty. Since onset symptoms of been persistent. Patient denies other neurologic changes, or any pain, discomfort. No recent medication changes, activity changes, diet changes. Patient on my exam, denies any other complaints.   Past Medical History  Diagnosis Date  . Acute bronchitis   . Asthma   . TIA (transient ischemic attack)   . Herpes zoster complication 6301    complicated, left eye involvement  . Nontoxic uninodular goiter     negative [place] 2008  . Macular degeneration (senile) of retina, unspecified 2012    wet  . Acute myocardial infarction, unspecified site, episode of care unspecified 03/07/11  . Coronary atherosclerosis of native coronary artery      s/p stent diagonal branch 1996; RCA 03/07/11  . Chronic diastolic heart failure   . Cardiomegaly   . Diaphragmatic hernia without mention of obstruction or gangrene   . Irritable bowel syndrome   . Other specified genital prolapse(618.89)     corrected with pessary  . Edema 2006  . Other abnormal blood chemistry   . Debility, unspecified   . Open wound of knee, leg (except thigh), and ankle, without mention of complication 60/1093    left leg, complicated wound, extended healing, resolved 01/2013  . Transient ischemic attack (TIA), and cerebral infarction without residual deficits(V12.54) 2008  . Shoulder pain 03/30/2013  . Hypertension   . Depression   . Finger numbness 12/28/2013    Left thumb, 1, 2nd fingers   Past Surgical History  Procedure Laterality Date  . Appendectomy    . Tonsillectomy and adenoidectomy    . Cataract extraction    . Cardiac catheterization  1996, 2001, 2012  . Carotid stent       diagonal branch of LCA   Family History  Problem Relation Age of Onset  . Diabetes Sister   . Cancer Mother   . Stroke Father   . Diabetes Father   . Dementia Sister    History  Substance Use Topics  . Smoking status: Former Smoker    Quit date: 12/23/1963  . Smokeless tobacco: Never Used  . Alcohol Use: 3.0 oz/week    5 Glasses of wine per week     Comment: 1/2 glass of wine    OB History   Grav Para Term Preterm Abortions TAB SAB Ect Mult Living                 Review of Systems  Constitutional:       Per HPI, otherwise negative  HENT:       Per HPI, otherwise negative  Respiratory:       Per HPI, otherwise negative  Cardiovascular:       Per HPI, otherwise negative  Gastrointestinal: Negative for vomiting.  Endocrine:       Negative aside from HPI  Genitourinary:       Neg aside from HPI   Musculoskeletal:       Per HPI, otherwise negative  Skin: Negative.   Neurological: Positive for speech difficulty. Negative for syncope and weakness.      Allergies  Plavix and Statins  Home Medications   Current Outpatient  Rx  Name  Route  Sig  Dispense  Refill  . aspirin 81 MG tablet   Oral   Take 81 mg by mouth daily.         Marland Kitchen FLUoxetine (PROZAC) 20 MG capsule   Oral   Take 20 mg by mouth daily.         . Fluticasone-Salmeterol (ADVAIR DISKUS) 250-50 MCG/DOSE AEPB   Inhalation   Inhale 1 puff into the lungs every 12 (twelve) hours.           . isosorbide mononitrate (IMDUR) 60 MG 24 hr tablet   Oral   Take 60 mg by mouth at bedtime.         Marland Kitchen loteprednol (LOTEMAX) 0.5 % ophthalmic suspension   Left Eye   Place 1 drop into the left eye. One drop in left eye 1-2 times daily as needed         . metoprolol succinate (TOPROL-XL) 25 MG 24 hr tablet   Oral   Take 25 mg by mouth daily.         . Multiple Vitamins-Minerals (ICAPS PO)   Oral   Take 1 tablet by mouth daily.         Marland Kitchen NEXIUM 40 MG capsule   Oral   Take 1 capsule by mouth  every evening.          . nitroGLYCERIN (NITROSTAT) 0.4 MG SL tablet   Sublingual   Place 0.4 mg under the tongue every 5 (five) minutes as needed for chest pain.           BP 165/79  Pulse 63  Temp(Src) 98 F (36.7 C) (Oral)  Resp 19  SpO2 97% Physical Exam  Nursing note and vitals reviewed. Constitutional: She is oriented to person, place, and time. She appears well-developed and well-nourished. No distress.  HENT:  Head: Normocephalic and atraumatic.  Eyes: Conjunctivae and EOM are normal.  Cardiovascular: Normal rate and regular rhythm.   Murmur heard. Pulmonary/Chest: Effort normal and breath sounds normal. No stridor. No respiratory distress.  Abdominal: She exhibits no distension.  Musculoskeletal: She exhibits no edema.  Neurological: She is alert and oriented to person, place, and time. No cranial nerve deficit.  Speech is essentially clear, with intermittent slurring of words. No facial asym Strength is 5/5 in upper and lower extremities throughout the patient is oriented x3.   Skin: Skin is warm and dry.  Psychiatric: She has a normal mood and affect.    ED Course  Procedures (including critical care time) Labs Review Labs Reviewed  DIFFERENTIAL - Abnormal; Notable for the following:    Neutro Abs 7.8 (*)    All other components within normal limits  URINALYSIS, ROUTINE W REFLEX MICROSCOPIC - Abnormal; Notable for the following:    Protein, ur 100 (*)    All other components within normal limits  PROTIME-INR  APTT  CBC  GLUCOSE, CAPILLARY  URINE MICROSCOPIC-ADD ON  COMPREHENSIVE METABOLIC PANEL  TROPONIN I   Imaging Review Ct Head Wo Contrast  01/31/2014   CLINICAL DATA:  Slurred speech, aphasia  EXAM: CT HEAD WITHOUT CONTRAST  TECHNIQUE: Contiguous axial images were obtained from the base of the skull through the vertex without intravenous contrast.  COMPARISON:  09/14/2007  FINDINGS: No skull fracture is noted. Paranasal sinuses shows probable  mucous retention cyst anterior aspect of left maxillary sinus measures 1.1 cm. The mastoid air cells are unremarkable.  No definite acute cortical infarction. Periventricular and patchy  subcortical white matter decreased attenuation probable due to chronic small vessel ischemic changes. No mass lesion is noted on this unenhanced scan. No intracranial hemorrhage, mass effect or midline shift.  IMPRESSION: No definite acute cortical infarction. Periventricular and patchy subcortical white matter decreased attenuation probable due to chronic small vessel ischemic changes. No mass lesion is noted on this unenhanced scan. No intracranial hemorrhage, mass effect or midline shift.   Electronically Signed   By: Lahoma Crocker M.D.   On: 01/31/2014 12:03    EKG Interpretation    Date/Time:  Tuesday January 31 2014 11:04:13 EST Ventricular Rate:  65 PR Interval:  169 QRS Duration: 91 QT Interval:  413 QTC Calculation: 429 R Axis:   -16 Text Interpretation:  Sinus rhythm Inferior infarct, age indeterminate Sinus rhythm Artifact T waves have normalized since last tracing Abnormal ekg Confirmed by Carmin Muskrat  MD [phone]) on 01/31/2014 1:41:41 PM           On re-exam the patient states that her verbal deficits have diminished.  3:10 PM Speech deficit is persistent, though improved. MDM   This pleasant elderly female presents with one day of speech difficulty.  On exam patient is awake, alert, oriented x3 with no other notable neurologic changes.  With the duration of symptoms code stroke was not indicated.  Patient's evaluation is largely reassuring, though with persistent neurologic deficit, she was admitted for further evaluation and management.    Carmin Muskrat, MD 01/31/14 [phone]

## 2014-01-31 NOTE — H&P (Signed)
[facility]                                                                                    Patient Demographics  Sonya [name], is a 80 y.o. female  MRN: [id number]   DOB - [birth year]  Admit Date - 01/31/2014  Outpatient Primary MD for the patient is GREEN, Sonya Spare, MD  Sonya [name] reports that she sees Dr. Rolly Salter NP, Sonya [name].  She presented from [facility]  With History of - Past Medical History  Diagnosis Date  . Acute bronchitis   . Asthma   . TIA (transient ischemic attack)   . Herpes zoster complication 123456    complicated, left eye involvement  . Nontoxic uninodular goiter     negative [place] 2008  . Macular degeneration (senile) of retina, unspecified 2012    wet  . Acute myocardial infarction, unspecified site, episode of care unspecified 03/07/11  . Coronary atherosclerosis of native coronary artery      s/p stent diagonal branch 1996; RCA 03/07/11  . Chronic diastolic heart failure   . Cardiomegaly   . Diaphragmatic hernia without mention of obstruction or gangrene   . Irritable bowel syndrome   . Other specified genital prolapse(618.89)     corrected with pessary  . Edema 2006  . Other abnormal blood chemistry   . Debility, unspecified   . Open wound of knee, leg (except thigh), and ankle, without mention of complication 0000000    left leg, complicated wound, extended healing, resolved 01/2013  . Transient ischemic attack (TIA), and cerebral infarction without residual deficits(V12.54) 2008  . Shoulder pain 03/30/2013  . Hypertension   . Depression   . Finger numbness 12/28/2013    Left thumb, 1, 2nd fingers      Past Surgical History  Procedure Laterality Date  . Appendectomy    . Tonsillectomy and adenoidectomy    . Cataract extraction    . Cardiac catheterization  1996, 2001, 2012  . Carotid stent      diagonal branch of LCA    in for   Chief Complaint  Patient presents with  . Aphasia      HPI  Sonya [name]  is a 80 y.o. female, with a pmh significant for asthma, TIAs, chronic diastolic heart failure, IBS, and hypertension. On 2/9 she noticed she felt "swimmy headed" but attributed it to missing her prozac dose (her refill was not ready) so she did not mention it to anyone.  Today she was supposed to have lunch at [employer] in [place] with her good friend.  The friend noticed that [name] was not speaking clearly and called the RN at [employer]    [name] lives in independent living, walks with a walker and is active per her report.  She takes care of her ADLs.  She denies any recent illness or deficit, but mentions frequent urination.  In the emergency room her labs appear normal, head CT is negative, aphasia has significantly improved.  She reports that she occasionally gets choked for no reason when she's eating or drinking.  [name] is being admitted for TIA work  up.  She has an allergy to Plavix.   She is a DNR.  Review of Systems    No Fever-chills, No Headache, No changes with Vision or hearing, No Chest pain, Cough or Shortness of Breath, No Abdominal pain, No Nausea or Vommitting, Bowel movements are regular, No Blood in stool or Urine, No new skin rashes or bruises, No new joints pains-aches,  No new weakness, tingling, numbness in any extremity, No recent weight gain or loss, No significant Mental Stressors.  A full 10 point Review of Systems was done, except as stated above, all other Review of Systems were negative.   Social History History  Substance Use Topics  . Smoking status: Former Smoker    Quit date: 12/23/1963  . Smokeless tobacco: Never Used  . Alcohol Use: 3.0 oz/week    5 Glasses of wine per week     Comment: 1/2 glass of wine      Family History Family History  Problem Relation Age of Onset  . Diabetes Sister   . Cancer Mother   . Stroke Father   . Diabetes Father   . Dementia Sister      Prior to Admission  medications   Medication Sig Start Date End Date Taking? Authorizing Provider  aspirin 81 MG tablet Take 81 mg by mouth daily.   Yes Historical Provider, MD  FLUoxetine (PROZAC) 20 MG capsule Take 20 mg by mouth daily.   Yes Historical Provider, MD  Fluticasone-Salmeterol (ADVAIR DISKUS) 250-50 MCG/DOSE AEPB Inhale 1 puff into the lungs every 12 (twelve) hours.     Yes Historical Provider, MD  isosorbide mononitrate (IMDUR) 60 MG 24 hr tablet Take 60 mg by mouth at bedtime.   Yes Historical Provider, MD  loteprednol (LOTEMAX) 0.5 % ophthalmic suspension Place 1 drop into the left eye. One drop in left eye 1-2 times daily as needed   Yes Historical Provider, MD  metoprolol succinate (TOPROL-XL) 25 MG 24 hr tablet Take 25 mg by mouth daily.   Yes Historical Provider, MD  Multiple Vitamins-Minerals (ICAPS PO) Take 1 tablet by mouth daily.   Yes Historical Provider, MD  NEXIUM 40 MG capsule Take 1 capsule by mouth every evening.  11/24/13  Yes Historical Provider, MD  nitroGLYCERIN (NITROSTAT) 0.4 MG SL tablet Place 0.4 mg under the tongue every 5 (five) minutes as needed for chest pain.    Yes Historical Provider, MD    Allergies  Allergen Reactions  . Plavix [Clopidogrel] Nausea And Vomiting  . Statins Other (See Comments)    Leg cramps    Physical Exam  Vitals  Blood pressure 146/68, pulse 56, temperature 97.6 F (36.4 C), temperature source Oral, resp. rate 21, SpO2 96.00%.   General:  lying in bed in NAD, elderly, very pleasant, well groomed.   Close friend at bedside.  Psych:  Normal affect and insight, Not Suicidal or Homicidal, Awake Alert, Oriented X 3.  Neuro:   No F.N deficits, ALL C.Nerves Intact, Strength 5/5 all 4 extremities, Sensation intact all 4 extremities, Plantars down going.  ENT:  Ears and Eyes appear Normal, Conjunctivae clear, PERRLA. Moist Oral Mucosa.  Neck:  Supple Neck, No JVD, No cervical lymphadenopathy appriciated, No Carotid Bruits.  Respiratory:   Symmetrical Chest wall movement, Good air movement bilaterally, CTAB.  Cardiac:  RRR, No Gallops, Rubs or Murmurs, No Parasternal Heave.  Abdomen:  Positive Bowel Sounds, Abdomen Soft, Non tender, No organomegaly appriciated  Skin:  No Cyanosis, Normal Skin Turgor, No  Skin Rash or Bruise.  Extremities:  Good muscle tone,  joints appear normal , no effusions, Normal ROM.   Data Review  CBC  Recent Labs Lab 01/31/14 1110  WBC 10.2  HGB 14.1  HCT 42.4  PLT 356  MCV 91.0  MCH 30.3  MCHC 33.3  RDW 13.5  LYMPHSABS 1.5  MONOABS 0.9  EOSABS 0.1  BASOSABS 0.0   ------------------------------------------------------------------------------------------------------------------  Chemistries   Recent Labs Lab 01/31/14 1110  NA 141  K 4.5  CL 104  CO2 24  GLUCOSE 100*  BUN 22  CREATININE 0.71  CALCIUM 8.9  AST 19  ALT 19  ALKPHOS 100  BILITOT 0.5    Coagulation profile  Recent Labs Lab 01/31/14 1110  INR 0.97    Cardiac Enzymes  Recent Labs Lab 01/31/14 1110  TROPONINI <0.30     Urinalysis    Component Value Date/Time   COLORURINE YELLOW 01/31/2014 1303   APPEARANCEUR CLEAR 01/31/2014 1303   LABSPEC 1.010 01/31/2014 1303   PHURINE 7.0 01/31/2014 1303   GLUCOSEU NEGATIVE 01/31/2014 1303   HGBUR NEGATIVE 01/31/2014 1303   BILIRUBINUR NEGATIVE 01/31/2014 1303   KETONESUR NEGATIVE 01/31/2014 1303   PROTEINUR 100* 01/31/2014 1303   UROBILINOGEN 0.2 01/31/2014 1303   NITRITE NEGATIVE 01/31/2014 1303   LEUKOCYTESUR NEGATIVE 01/31/2014 1303    ----------------------------------------------------------------------------------------------------------------  Imaging results:   Ct Head Wo Contrast  01/31/2014   CLINICAL DATA:  Slurred speech, aphasia  EXAM: CT HEAD WITHOUT CONTRAST  TECHNIQUE: Contiguous axial images were obtained from the base of the skull through the vertex without intravenous contrast.  COMPARISON:  09/14/2007  FINDINGS: No skull fracture is  noted. Paranasal sinuses shows probable mucous retention cyst anterior aspect of left maxillary sinus measures 1.1 cm. The mastoid air cells are unremarkable.  No definite acute cortical infarction. Periventricular and patchy subcortical white matter decreased attenuation probable due to chronic small vessel ischemic changes. No mass lesion is noted on this unenhanced scan. No intracranial hemorrhage, mass effect or midline shift.  IMPRESSION: No definite acute cortical infarction. Periventricular and patchy subcortical white matter decreased attenuation probable due to chronic small vessel ischemic changes. No mass lesion is noted on this unenhanced scan. No intracranial hemorrhage, mass effect or midline shift.   Electronically Signed   By: Lahoma Crocker M.D.   On: 01/31/2014 12:03    My personal review of EKG: Rhythm NSR, no Acute ST changes    Assessment & Plan  Principal Problem:   CVA (cerebral vascular accident) vs TIA with aphasia Active Problems:   Hypertension   CAD (coronary artery disease)   HTN (hypertension)   Chronic asthma   Chronic diastolic heart failure    CVA / TIA with symptoms of Aphasia Started 2/9, resolving today.  Out of window for TPA On 81 mg aspirin, will switch to full dose of aspirin  Allergic to plavix Neurology consulted. Admitted under stroke order set.  MRI pending.  HTN Patients BP meds are currently held. BP moderately controlled - allowing for permissive HTN pulse is relatively bradycardic. Will monitor vitals and resume metoprolol and Imdur when appropriate.  Chronic asthma Continue Advair.  Add albuterol inhaler PRN  Chronic diastolic heart failure Patient is currently euvolemic. No echo in Epic. 2D echo pending with CVA order set. Metoprolol held due to bradycardia and need for brain profusion. Saline lock fluids.  Intake and output.  Heart healthy diet.  DVT Prophylaxis  Lovenox   AM Labs Ordered, also please review  Full  Orders  Family Communication:   Close friend and 2nd in line for health care power of attorney at bedside.   Code Status:  DNR  Likely DC to  [facility] Condition:  Appears stable.  Time spent in minutes : 60    [place], Bobby Rumpf PA-C on 01/31/2014 at 4:28 PM  Between 7am to 7pm - Pager - [phone] After 7pm go to [email or web address] - password [email or web address] And look for the night coverage person covering me after hours  [place]  [phone]  Addendum  Patient seen and examined, chart and data base reviewed.  I agree with the above assessment and plan.  For full details please see Mrs. Imogene Burn PA note.  I reviewed and addended the above note as needed   Birdie Hopes, MD [facility] Pager: [phone] 01/31/2014, 5:01 PM

## 2014-01-31 NOTE — Consult Note (Signed)
Referring Physician: Vanita Panda    Chief Complaint: dysarthria  HPI:                                                                                                                                         Sonya [name] is an 80 y.o. female who woke up yesterday morning and noted her speech was slurred.  She did not seek medical attention at that time thinking it would clear.  She went to sleep and woke up this AM with continued slurred speech.  Due to her symptoms not resolving she came to ED.  Her neighbor who is at bedside states her speech has improved but still is not back to baseline.  The only other complaint she had was generalized weakness. She is on ASA 81 mg daily and states she has sever GI upset with Plavix.   Date last known well: Date: 01/30/2014 Time last known well: Unable to determine tPA Given: No: out of window  Past Medical History  Diagnosis Date  . Acute bronchitis   . Asthma   . TIA (transient ischemic attack)   . Herpes zoster complication 123456    complicated, left eye involvement  . Nontoxic uninodular goiter     negative [place] 2008  . Macular degeneration (senile) of retina, unspecified 2012    wet  . Acute myocardial infarction, unspecified site, episode of care unspecified 03/07/11  . Coronary atherosclerosis of native coronary artery      s/p stent diagonal branch 1996; RCA 03/07/11  . Chronic diastolic heart failure   . Cardiomegaly   . Diaphragmatic hernia without mention of obstruction or gangrene   . Irritable bowel syndrome   . Other specified genital prolapse(618.89)     corrected with pessary  . Edema 2006  . Other abnormal blood chemistry   . Debility, unspecified   . Open wound of knee, leg (except thigh), and ankle, without mention of complication 0000000    left leg, complicated wound, extended healing, resolved 01/2013  . Transient ischemic attack (TIA), and cerebral infarction without residual deficits(V12.54) 2008  . Shoulder pain 03/30/2013   . Hypertension   . Depression   . Finger numbness 12/28/2013    Left thumb, 1, 2nd fingers    Past Surgical History  Procedure Laterality Date  . Appendectomy    . Tonsillectomy and adenoidectomy    . Cataract extraction    . Cardiac catheterization  1996, 2001, 2012  . Carotid stent      diagonal branch of LCA    Family History  Problem Relation Age of Onset  . Diabetes Sister   . Cancer Mother   . Stroke Father   . Diabetes Father   . Dementia Sister    Social History:  reports that she quit smoking about 50 years ago. She has never used smokeless tobacco. She reports that she drinks about 3.0  ounces of alcohol per week. She reports that she does not use illicit drugs.  Allergies:  Allergies  Allergen Reactions  . Plavix [Clopidogrel] Nausea And Vomiting  . Statins Other (See Comments)    Leg cramps    Medications:                                                                                                                           No current facility-administered medications for this encounter.   Current Outpatient Prescriptions  Medication Sig Dispense Refill  . aspirin 81 MG tablet Take 81 mg by mouth daily.      Marland Kitchen FLUoxetine (PROZAC) 20 MG capsule Take 20 mg by mouth daily.      . Fluticasone-Salmeterol (ADVAIR DISKUS) 250-50 MCG/DOSE AEPB Inhale 1 puff into the lungs every 12 (twelve) hours.        . isosorbide mononitrate (IMDUR) 60 MG 24 hr tablet Take 60 mg by mouth at bedtime.      Marland Kitchen loteprednol (LOTEMAX) 0.5 % ophthalmic suspension Place 1 drop into the left eye. One drop in left eye 1-2 times daily as needed      . metoprolol succinate (TOPROL-XL) 25 MG 24 hr tablet Take 25 mg by mouth daily.      . Multiple Vitamins-Minerals (ICAPS PO) Take 1 tablet by mouth daily.      Marland Kitchen NEXIUM 40 MG capsule Take 1 capsule by mouth every evening.       . nitroGLYCERIN (NITROSTAT) 0.4 MG SL tablet Place 0.4 mg under the tongue every 5 (five) minutes as needed for chest  pain.          ROS:                                                                                                                                       History obtained from the patient  General ROS: negative for - chills, fatigue, fever, night sweats, weight gain or weight loss Psychological ROS: negative for - behavioral disorder, hallucinations, memory difficulties, mood swings or suicidal ideation Ophthalmic ROS: negative for - blurry vision, double vision, eye pain or loss of vision ENT ROS: negative for - epistaxis, nasal discharge, oral lesions, sore throat, tinnitus or vertigo Allergy and Immunology ROS: negative for - hives or itchy/watery eyes Hematological and Lymphatic ROS: negative for - bleeding problems, bruising or swollen  lymph nodes Endocrine ROS: negative for - galactorrhea, hair pattern changes, polydipsia/polyuria or temperature intolerance Respiratory ROS: negative for - cough, hemoptysis, shortness of breath or wheezing Cardiovascular ROS: negative for - chest pain, dyspnea on exertion, edema or irregular heartbeat Gastrointestinal ROS: negative for - abdominal pain, diarrhea, hematemesis, nausea/vomiting or stool incontinence Genito-Urinary ROS: negative for - dysuria, hematuria, incontinence or urinary frequency/urgency Musculoskeletal ROS: negative for - joint swelling or muscular weakness Neurological ROS: as noted in HPI Dermatological ROS: negative for rash and skin lesion changes  Neurologic Examination:                                                                                                      Blood pressure 146/68, pulse 56, temperature 97.6 F (36.4 C), temperature source Oral, resp. rate 21, SpO2 96.00%.   Mental Status: Alert, oriented, thought content appropriate.  Speech dysarthric without evidence of aphasia.  Able to follow 3 step commands without difficulty. 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, right pupil, round, reactive to light  III,IV, VI: ptosis not present, extra-ocular motions intact bilaterally V,VII: smile symmetric with a slight left NL fold decrease at rest, facial light touch sensation normal bilaterally VIII: hearing normal bilaterally IX,X: gag reflex present XI: bilateral shoulder shrug XII: midline tongue extension without atrophy or fasciculations  Motor: Right : Upper extremity   5/5    Left:     Upper extremity   5/5  Lower extremity   5/5     Lower extremity   5/5 Tone and bulk:normal tone throughout; no atrophy noted Sensory: Pinprick and light touch intact throughout, bilaterally Deep Tendon Reflexes:  Right: Upper Extremity   Left: Upper extremity   biceps (C-5 to C-6) 2/4   biceps (C-5 to C-6) 2/4 tricep (C7) 2/4    triceps (C7) 2/4 Brachioradialis (C6) 2/4  Brachioradialis (C6) 2/4  Lower Extremity Lower Extremity  quadriceps (L-2 to L-4) 2/4   quadriceps (L-2 to L-4) 2/4 Achilles (S1) 1/4   Achilles (S1) 1/4  Plantars: Right: downgoing   Left: downgoing Cerebellar: normal finger-to-nose,  normal heel-to-shin test Gait: not tested due to multiple leads in ED CV: pulses palpable throughout    Lab Results: Basic Metabolic Panel:  Recent Labs Lab 01/31/14 1110  NA 141  K 4.5  CL 104  CO2 24  GLUCOSE 100*  BUN 22  CREATININE 0.71  CALCIUM 8.9    Liver Function Tests:  Recent Labs Lab 01/31/14 1110  AST 19  ALT 19  ALKPHOS 100  BILITOT 0.5  PROT 7.6  ALBUMIN 3.7   No results found for this basename: LIPASE, AMYLASE,  in the last 168 hours No results found for this basename: AMMONIA,  in the last 168 hours  CBC:  Recent Labs Lab 01/31/14 1110  WBC 10.2  NEUTROABS 7.8*  HGB 14.1  HCT 42.4  MCV 91.0  PLT 356    Cardiac Enzymes:  Recent Labs Lab 01/31/14 1110  TROPONINI <  0.30    Lipid Panel: No results found for this basename: CHOL, TRIG, HDL,  CHOLHDL, VLDL, LDLCALC,  in the last 168 hours  CBG:  Recent Labs Lab 01/31/14 [place]    Microbiology: Results for orders placed during the hospital encounter of 03/07/11  MRSA PCR SCREENING     Status: None   Collection Time    03/07/11  3:42 PM      Result Value Range Status   MRSA by PCR    NEGATIVE Final   Value: NEGATIVE            The GeneXpert MRSA Assay (FDA     approved for NASAL specimens     only), is one component of a     comprehensive MRSA colonization     surveillance program. It is not     intended to diagnose MRSA     infection nor to guide or     monitor treatment for     MRSA infections.    Coagulation Studies:  Recent Labs  01/31/14 1110  LABPROT 12.7  INR 0.97    Imaging: Ct Head Wo Contrast  01/31/2014   CLINICAL DATA:  Slurred speech, aphasia  EXAM: CT HEAD WITHOUT CONTRAST  TECHNIQUE: Contiguous axial images were obtained from the base of the skull through the vertex without intravenous contrast.  COMPARISON:  09/14/2007  FINDINGS: No skull fracture is noted. Paranasal sinuses shows probable mucous retention cyst anterior aspect of left maxillary sinus measures 1.1 cm. The mastoid air cells are unremarkable.  No definite acute cortical infarction. Periventricular and patchy subcortical white matter decreased attenuation probable due to chronic small vessel ischemic changes. No mass lesion is noted on this unenhanced scan. No intracranial hemorrhage, mass effect or midline shift.  IMPRESSION: No definite acute cortical infarction. Periventricular and patchy subcortical white matter decreased attenuation probable due to chronic small vessel ischemic changes. No mass lesion is noted on this unenhanced scan. No intracranial hemorrhage, mass effect or midline shift.   Electronically Signed   By: Lahoma Crocker M.D.   On: 01/31/2014 12:03    Stroke Risk Factors - hypertension   Assessment and plan discussed with with attending physician and they  are in agreement.    Etta Quill PA-C [facility] [phone]  01/31/2014, 4:30 PM   Assessment: 80 y.o. female with acute onset of dysarthria and some dysphagia.  Likely acute CVA.    Recommend: 1. HgbA1c, fasting lipid panel 2. MRI, MRA  of the brain without contrast 3. PT consult, OT consult, Speech consult 4. Echocardiogram 5. Carotid dopplers 6. Prophylactic therapy-Antiplatelet med: Aspirin - dose 325 mg daily 7. Risk  Factor modification  I personally participate in this patient's evaluation and management, including the above clinical assessment and management recommendations.  Rush Farmer M.D. [facility] [phone]

## 2014-01-31 NOTE — ED Notes (Signed)
Patient transported to CT 

## 2014-01-31 NOTE — Progress Notes (Signed)
Pt admitted to 4N17 at 1720; She is alert and oriented x 4; denies pain or discomfort; NIH 0; Speech clear. 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  She also reports limited vision in rt eye due to macular degeneration.  However, she lives independently in an apt at [facility].  Pt in bed with bed alarm set.  Will continue to monitor.

## 2014-01-31 NOTE — ED Notes (Signed)
Pt. Developed slurred speech yesterday.  Pt. Is also hypertensive.   Alert and oriented X3.  No other neuro deficits.  Pt. Denies any pain or discomfort.

## 2014-02-01 ENCOUNTER — Observation Stay (HOSPITAL_COMMUNITY): Payer: Medicare Other

## 2014-02-01 DIAGNOSIS — I6350 Cerebral infarction due to unspecified occlusion or stenosis of unspecified cerebral artery: Secondary | ICD-10-CM | POA: Diagnosis not present

## 2014-02-01 DIAGNOSIS — I252 Old myocardial infarction: Secondary | ICD-10-CM | POA: Diagnosis not present

## 2014-02-01 DIAGNOSIS — R4701 Aphasia: Secondary | ICD-10-CM | POA: Diagnosis present

## 2014-02-01 DIAGNOSIS — I059 Rheumatic mitral valve disease, unspecified: Secondary | ICD-10-CM

## 2014-02-01 DIAGNOSIS — I5032 Chronic diastolic (congestive) heart failure: Secondary | ICD-10-CM | POA: Diagnosis not present

## 2014-02-01 DIAGNOSIS — F3289 Other specified depressive episodes: Secondary | ICD-10-CM | POA: Diagnosis present

## 2014-02-01 DIAGNOSIS — R5381 Other malaise: Secondary | ICD-10-CM | POA: Diagnosis present

## 2014-02-01 DIAGNOSIS — R471 Dysarthria and anarthria: Secondary | ICD-10-CM | POA: Diagnosis present

## 2014-02-01 DIAGNOSIS — Z66 Do not resuscitate: Secondary | ICD-10-CM | POA: Diagnosis present

## 2014-02-01 DIAGNOSIS — H3530 Unspecified macular degeneration: Secondary | ICD-10-CM | POA: Diagnosis present

## 2014-02-01 DIAGNOSIS — E785 Hyperlipidemia, unspecified: Secondary | ICD-10-CM | POA: Diagnosis present

## 2014-02-01 DIAGNOSIS — Z7982 Long term (current) use of aspirin: Secondary | ICD-10-CM | POA: Diagnosis not present

## 2014-02-01 DIAGNOSIS — Z823 Family history of stroke: Secondary | ICD-10-CM | POA: Diagnosis not present

## 2014-02-01 DIAGNOSIS — R4789 Other speech disturbances: Secondary | ICD-10-CM | POA: Diagnosis not present

## 2014-02-01 DIAGNOSIS — Z87891 Personal history of nicotine dependence: Secondary | ICD-10-CM | POA: Diagnosis not present

## 2014-02-01 DIAGNOSIS — I10 Essential (primary) hypertension: Secondary | ICD-10-CM | POA: Diagnosis present

## 2014-02-01 DIAGNOSIS — F329 Major depressive disorder, single episode, unspecified: Secondary | ICD-10-CM | POA: Diagnosis present

## 2014-02-01 DIAGNOSIS — I2510 Atherosclerotic heart disease of native coronary artery without angina pectoris: Secondary | ICD-10-CM | POA: Diagnosis not present

## 2014-02-01 DIAGNOSIS — R1310 Dysphagia, unspecified: Secondary | ICD-10-CM | POA: Diagnosis not present

## 2014-02-01 DIAGNOSIS — Z833 Family history of diabetes mellitus: Secondary | ICD-10-CM | POA: Diagnosis not present

## 2014-02-01 DIAGNOSIS — J45909 Unspecified asthma, uncomplicated: Secondary | ICD-10-CM | POA: Diagnosis not present

## 2014-02-01 DIAGNOSIS — R339 Retention of urine, unspecified: Secondary | ICD-10-CM | POA: Diagnosis not present

## 2014-02-01 DIAGNOSIS — Z8673 Personal history of transient ischemic attack (TIA), and cerebral infarction without residual deficits: Secondary | ICD-10-CM | POA: Diagnosis not present

## 2014-02-01 LAB — LIPID PANEL
CHOLESTEROL: 235 mg/dL — AB (ref 0–200)
HDL: 69 mg/dL (ref >39)
LDL Cholesterol: 129 mg/dL — ABNORMAL HIGH (ref 0–99)
Total CHOL/HDL Ratio: 3.4 RATIO
Triglycerides: 185 mg/dL — ABNORMAL HIGH (ref <150)
VLDL: 37 mg/dL (ref 0–40)

## 2014-02-01 LAB — HEMOGLOBIN A1C
Hgb A1c MFr Bld: 6.1 % — ABNORMAL HIGH (ref <5.7)
Mean Plasma Glucose: 128 mg/dL — ABNORMAL HIGH (ref <117)

## 2014-02-01 MED ORDER — ASPIRIN 81 MG PO CHEW
324.0000 mg | CHEWABLE_TABLET | Freq: Every day | ORAL | Status: DC
  Filled 2014-02-01: qty 4

## 2014-02-01 MED ORDER — ISOSORBIDE MONONITRATE ER 30 MG PO TB24
30.0000 mg | ORAL_TABLET | Freq: Every day | ORAL | Status: DC
  Administered 2014-02-01 – 2014-02-02 (×2): 30 mg via ORAL
  Filled 2014-02-01 (×2): qty 1

## 2014-02-01 NOTE — Progress Notes (Signed)
PT Cancellation Note  Patient Details Name: KEMBA HOPPES MRN: [id number] DOB: [birth year]   Cancelled Treatment:    Reason Eval/Treat Not Completed: Patient at procedure or test/unavailable, MRI   Duncan Dull 02/01/2014, 11:25 AM Alben Deeds, PT DPT  [phone]

## 2014-02-01 NOTE — Progress Notes (Signed)
SLP Cancellation Note  [name] MRN: [id number] DOB: [birth year]   Cancelled treatment:        RN asked this SLP if I was coming to see 4N17.   Apparently pt is having difficulty with current diet, unfortunately, pt is off unit for tests at this time.  Will check next date.  Wyona Neils B. Quentin Ore [facility], CCC-SLP [phone]  Shonna Chock 02/01/2014, 2:52 PM

## 2014-02-01 NOTE — Progress Notes (Signed)
[facility] PROGRESS NOTE  Sonya [name] MVH:[id number] DOB: [birth year] DOA: 01/31/2014 PCP: Estill Dooms, MD  Assessment/Plan: #1 dysarthria/probable acute CVA Patient presented with dysarthria. MRI MRA pending. 2-D echo pending. Carotid Doppler is pending. Fasting lipid panel pending. Continue aspirin for secondary stroke prevention. Neurology following and appreciate input and recommendations. PT/OT/ST.  #2 hypertension Will resume half patient's home dose of Imdur. Allow permissive hypertension. Follow.  #3 chronic asthma Stable. Continue Advair.  #4 chronic diastolic heart failure/coronary artery disease Currently stable. Patient is currently euvolemic. 2-D echo is pending. Beta blocker on hold secondary to bradycardia premise of hypertension. Follow.  #5 prophylaxis Protonix for GI prophylaxis, Lovenox for DVT prophylaxis.  Code Status: DO NOT RESUSCITATE Family Communication: Updated patient and her friend at bedside. Disposition Plan: Home versus SNF when medically stable   Consultants:  Neurology: Dr. Nicole Kindred 01/31/2014  Procedures:  MRI pending  Antibiotics:  None  HPI/Subjective: Patient states speech has not improved.  Objective: Filed Vitals:   02/01/14 1000  BP: 179/72  Pulse: 71  Temp: 97.5 F (36.4 C)  Resp: 16    Intake/Output Summary (Last 24 hours) at 02/01/14 1139 Last data filed at 02/01/14 0924  Gross per 24 hour  Intake    360 ml  Output      4 ml  Net    356 ml   Filed Weights   01/31/14 1825  Weight: 66.225 kg (146 lb)    Exam:   General:  NAD  Cardiovascular: RRR  Respiratory: CTAB  Abdomen: Soft, nontender, nondistended, positive bowel sounds.  Musculoskeletal: No clubbing cyanosis or edema  Data Reviewed: Basic Metabolic Panel:  Recent Labs Lab 01/31/14 1110  NA 141  K 4.5  CL 104  CO2 24  GLUCOSE 100*  BUN 22  CREATININE 0.71  CALCIUM 8.9   Liver Function Tests:  Recent Labs Lab  01/31/14 1110  AST 19  ALT 19  ALKPHOS 100  BILITOT 0.5  PROT 7.6  ALBUMIN 3.7   No results found for this basename: LIPASE, AMYLASE,  in the last 168 hours No results found for this basename: AMMONIA,  in the last 168 hours CBC:  Recent Labs Lab 01/31/14 1110  WBC 10.2  NEUTROABS 7.8*  HGB 14.1  HCT 42.4  MCV 91.0  PLT 356   Cardiac Enzymes:  Recent Labs Lab 01/31/14 1110  TROPONINI <0.30   BNP (last 3 results)  Recent Labs  01/31/14 2256  PROBNP 1230.0*   CBG:  Recent Labs Lab 01/31/14 1307  GLUCAP 95    No results found for this or any previous visit (from the past 240 hour(s)).   Studies: Dg Chest 2 View  01/31/2014   CLINICAL DATA:  Stroke. No chest pain or shortness of breath. Cough when drinking fluids. Weakness and slurred speech.  EXAM: CHEST  2 VIEW  COMPARISON:  Chest x-ray on 09/28/2012  FINDINGS: The heart is enlarged. There is perihilar peribronchial thickening. No focal consolidations or pleural effusions are identified. No pulmonary edema. Moderate hiatal hernia contains an air-fluid level. Degenerative changes are seen in the thoracic spine. Chronic changes are noted in the right shoulder.  IMPRESSION: 1. Cardiomegaly without pulmonary edema. 2. Bronchitic changes.   Electronically Signed   By: Shon Hale M.D.   On: 01/31/2014 20:07   Ct Head Wo Contrast  01/31/2014   CLINICAL DATA:  Slurred speech, aphasia  EXAM: CT HEAD WITHOUT CONTRAST  TECHNIQUE: Contiguous axial images were obtained from the  base of the skull through the vertex without intravenous contrast.  COMPARISON:  09/14/2007  FINDINGS: No skull fracture is noted. Paranasal sinuses shows probable mucous retention cyst anterior aspect of left maxillary sinus measures 1.1 cm. The mastoid air cells are unremarkable.  No definite acute cortical infarction. Periventricular and patchy subcortical white matter decreased attenuation probable due to chronic small vessel ischemic changes. No mass  lesion is noted on this unenhanced scan. No intracranial hemorrhage, mass effect or midline shift.  IMPRESSION: No definite acute cortical infarction. Periventricular and patchy subcortical white matter decreased attenuation probable due to chronic small vessel ischemic changes. No mass lesion is noted on this unenhanced scan. No intracranial hemorrhage, mass effect or midline shift.   Electronically Signed   By: Lahoma Crocker M.D.   On: 01/31/2014 12:03    Scheduled Meds: . sodium chloride   Intravenous STAT  . aspirin  81 mg Oral Daily  . enoxaparin (LOVENOX) injection  30 mg Subcutaneous Q24H  . FLUoxetine  20 mg Oral Daily  . isosorbide mononitrate  30 mg Oral Daily  . mometasone-formoterol  2 puff Inhalation BID  . pantoprazole  40 mg Oral Daily   Continuous Infusions:   Principal Problem:   CVA (cerebral vascular accident) vs TIA with aphasia Active Problems:   Hypertension   CAD (coronary artery disease)   Dysphasia   HTN (hypertension)   Chronic diastolic heart failure    Time spent: 54 mins    [facility] MD [facility] Pager [phone]. If 7PM-7AM, please contact night-coverage at [email or web address], password [facility] 02/01/2014, 11:39 AM  LOS: 1 day

## 2014-02-01 NOTE — Progress Notes (Signed)
*  PRELIMINARY RESULTS* Vascular Ultrasound Carotid Duplex (Doppler) has been completed.  Preliminary findings: Bilateral:  1-39% ICA stenosis.  Vertebral artery flow is antegrade.      Landry Mellow, RDMS, RVT  02/01/2014, 2:53 PM

## 2014-02-01 NOTE — Progress Notes (Signed)
UR completed. Patient changed to inpatient- +CVA 

## 2014-02-01 NOTE — Evaluation (Signed)
Speech Language Pathology Evaluation [name] MRN: [id number] DOB: Oct 10, 1913 Today's Date: 02/01/2014 Time: 9798-9211 SLP Time Calculation (min): 15 min  Problem List:  Patient Active Problem List   Diagnosis Date Noted  . Dysphasia 01/31/2014  . CVA (cerebral vascular accident) vs TIA with aphasia 01/31/2014  . HTN (hypertension) 01/31/2014  . Chronic asthma 01/31/2014  . Chronic diastolic heart failure 94/17/4081  . Edema 12/28/2013  . Finger numbness 12/28/2013  . Open wound of knee, leg (except thigh), and ankle, without mention of complication 44/81/8563  . Osteoarthritis of right knee 10/01/2013  . Excessive cerumen in right ear canal 06/29/2013  . CAD (coronary artery disease)   . Shoulder pain 03/30/2013  . Hypertension   . Depression   . Other specified genital prolapse 11/03/2012  . Hiatal hernia 10/03/2012   Past Medical History:  Past Medical History  Diagnosis Date  . Acute bronchitis   . Asthma   . TIA (transient ischemic attack)   . Herpes zoster complication 1497    complicated, left eye involvement  . Nontoxic uninodular goiter     negative [place] 2008  . Macular degeneration (senile) of retina, unspecified 2012    wet  . Acute myocardial infarction, unspecified site, episode of care unspecified 03/07/11  . Coronary atherosclerosis of native coronary artery      s/p stent diagonal branch 1996; RCA 03/07/11  . Chronic diastolic heart failure   . Cardiomegaly   . Diaphragmatic hernia without mention of obstruction or gangrene   . Irritable bowel syndrome   . Other specified genital prolapse(618.89)     corrected with pessary  . Edema 2006  . Other abnormal blood chemistry   . Debility, unspecified   . Open wound of knee, leg (except thigh), and ankle, without mention of complication 01/6377    left leg, complicated wound, extended healing, resolved 01/2013  . Transient ischemic attack (TIA), and cerebral infarction without  residual deficits(V12.54) 2008  . Shoulder pain 03/30/2013  . Hypertension   . Depression   . Finger numbness 12/28/2013    Left thumb, 1, 2nd fingers   Past Surgical History:  Past Surgical History  Procedure Laterality Date  . Appendectomy    . Tonsillectomy and adenoidectomy    . Cataract extraction    . Cardiac catheterization  1996, 2001, 2012  . Carotid stent      diagonal branch of LCA   HPI:  80 y.o. female, with a pmh significant for asthma, TIAs, chronic diastolic heart failure, acute bronchitis, macular degeneration, , MI IBS, and hypertension. On 2/9 she noticed she felt "swimmy headed" but attributed it to missing her prozac dose (her refill was not ready) so she did not mention it to anyone. Today she was supposed to have lunch at [employer] in [place] with her good friend. The friend noticed that [name] was not speaking clearly and called the RN at [employer]. [name] lives in independent living, walks with a walker and is active per her report. She takes care of her ADLs.  In the emergency room her labs appear normal, head CT is negative, aphasia has significantly improved. CXR.  She reports that she occasionally gets choked for no reason when she's eating or drinking.    Assessment / Plan / Recommendation Clinical Impression  Pt. exhibited functional cognitive abilities during brief assessment during functional activity (eating breakfast).  Problem solving, awareness and attention were appropriate.  She demonstrated mild dysarthria in conversation (  mildly distorted) and decreased labial ROM and strength.  Pt. would benefit from ST to facilitate speech intelligibility.  She would also benefit from a bedside swallow assessment due to observed pocketing of eggs in left buccal cavity.  Pt.  reports coughing with liquids as well.      SLP Assessment  Patient needs continued Speech Lanaguage Pathology Services    Follow Up Recommendations  None    Frequency and Duration  min 2x/week  2 weeks   Pertinent Vitals/Pain WDL   SLP Goals  SLP Goals Potential to Achieve Goals: Good  SLP Evaluation Prior Functioning  Cognitive/Linguistic Baseline: Within functional limits (no family to confirm) Type of Home: Independent living facility   Cognition  Overall Cognitive Status: Within Functional Limits for tasks assessed Arousal/Alertness: Awake/alert Orientation Level: Oriented X4 Attention: Sustained Sustained Attention: Appears intact Memory: Appears intact Awareness: Appears intact Problem Solving: Appears intact Safety/Judgment: Appears intact (to be further assessed)    Comprehension  Auditory Comprehension Overall Auditory Comprehension: Appears within functional limits for tasks assessed Visual Recognition/Discrimination Discrimination: Not tested Reading Comprehension Reading Status: Not tested    Expression Expression Primary Mode of Expression: Verbal Verbal Expression Overall Verbal Expression: Appears within functional limits for tasks assessed Initiation: No impairment Level of Generative/Spontaneous Verbalization: Conversation Repetition: No impairment Naming: No impairment Pragmatics: No impairment Written Expression Dominant Hand: Right Written Expression: Not tested   Oral / Motor Oral Motor/Sensory Function Overall Oral Motor/Sensory Function: Impaired Labial ROM: Reduced left Labial Symmetry: Abnormal symmetry left Labial Strength: Reduced Labial Sensation: Reduced Lingual ROM: Within Functional Limits Lingual Symmetry: Within Functional Limits Lingual Strength: Within Functional Limits Facial ROM: Reduced left Motor Speech Overall Motor Speech: Impaired Respiration: Within functional limits Phonation: Low vocal intensity Resonance: Within functional limits Articulation: Within functional limitis Intelligibility: Intelligibility reduced Word: 75-100% accurate Phrase: 75-100% accurate Sentence: 75-100%  accurate Conversation: 75-100% accurate Motor Planning: Witnin functional limits   GO Functional Assessment Tool Used: clinical judgement Functional Limitations: Motor speech Motor Speech Current Status [phone]): At least 20 percent but less than 40 percent impaired, limited or restricted Motor Speech Goal Status [phone]): At least 1 percent but less than 20 percent impaired, limited or restricted   Houston Siren M.Ed [employer] [phone]  02/01/2014

## 2014-02-01 NOTE — Progress Notes (Signed)
Stroke Team Progress Note  HISTORY Sonya [name] is an 80 y.o. female who woke up yesterday morning 01/30/2014 and noted her speech was slurred. She did not seek medical attention at that time thinking it would clear. She went to sleep and woke up this AM 01/31/2014 with continued slurred speech. Due to her symptoms not resolving she came to ED. Her neighbor who is at bedside states her speech has improved but still is not back to baseline. The only other complaint she had was generalized weakness. She is on ASA 81 mg daily and states she has sever GI upset with Plavix.  Patient was not administerd TPA secondary to delay in arrival. She was admitted for further evaluation and treatment.  SUBJECTIVE Her daughter is at the bedside.  Overall she feels her condition is completely resolved.   OBJECTIVE Most recent Vital Signs: Filed Vitals:   01/31/14 2032 01/31/14 2210 02/01/14 0251 02/01/14 0500  BP: 146/72 186/68 172/87 191/76  Pulse: 70 61 62 65  Temp: 97.8 F (36.6 C) 97.6 F (36.4 C) 97.8 F (36.6 C) 97.5 F (36.4 C)  TempSrc: Oral Oral Oral Oral  Resp: 18 20 18 20   Height:      Weight:      SpO2: 96% 98% 96% 98%   CBG (last 3)   Recent Labs  01/31/14 1307  GLUCAP 95    IV Fluid Intake:     MEDICATIONS  . sodium chloride   Intravenous STAT  . aspirin  81 mg Oral Daily  . enoxaparin (LOVENOX) injection  30 mg Subcutaneous Q24H  . FLUoxetine  20 mg Oral Daily  . isosorbide mononitrate  30 mg Oral Daily  . mometasone-formoterol  2 puff Inhalation BID  . pantoprazole  40 mg Oral Daily   PRN:  acetaminophen, albuterol, loteprednol, nitroGLYCERIN, ondansetron (ZOFRAN) IV, senna-docusate  Diet:  Cardiac thin liquids Activity:  Bedrest, OOB with assistance DVT Prophylaxis:  Lovenox 30 mg sq daily   CLINICALLY SIGNIFICANT STUDIES Basic Metabolic Panel:  Recent Labs Lab 01/31/14 1110  NA 141  K 4.5  CL 104  CO2 24  GLUCOSE 100*  BUN 22  CREATININE 0.71  CALCIUM 8.9    Liver Function Tests:  Recent Labs Lab 01/31/14 1110  AST 19  ALT 19  ALKPHOS 100  BILITOT 0.5  PROT 7.6  ALBUMIN 3.7   CBC:  Recent Labs Lab 01/31/14 1110  WBC 10.2  NEUTROABS 7.8*  HGB 14.1  HCT 42.4  MCV 91.0  PLT 356   Coagulation:  Recent Labs Lab 01/31/14 1110  LABPROT 12.7  INR 0.97   Cardiac Enzymes:  Recent Labs Lab 01/31/14 1110  TROPONINI <0.30   Urinalysis:  Recent Labs Lab 01/31/14 1303  COLORURINE YELLOW  LABSPEC 1.010  PHURINE 7.0  GLUCOSEU NEGATIVE  HGBUR NEGATIVE  BILIRUBINUR NEGATIVE  KETONESUR NEGATIVE  PROTEINUR 100*  UROBILINOGEN 0.2  NITRITE NEGATIVE  LEUKOCYTESUR NEGATIVE   Lipid Panel    Component Value Date/Time   CHOL 235* 02/01/2014 0448   TRIG 185* 02/01/2014 0448   HDL 69 02/01/2014 0448   CHOLHDL 3.4 02/01/2014 0448   VLDL 37 02/01/2014 0448   LDLCALC 129* 02/01/2014 0448   HgbA1C  Lab Results  Component Value Date   HGBA1C  Value: 6.7 (NOTE)  According to the ADA Clinical Practice Recommendations for 2011, when HbA1c is used as a screening test:   >=6.5%   Diagnostic of Diabetes Mellitus           (if abnormal result  is confirmed)  5.7-6.4%   Increased risk of developing Diabetes Mellitus  References:Diagnosis and Classification of Diabetes Mellitus,Diabetes QMVH,8469,62(XBMWU 1):S62-S69 and Standards of Medical Care in         Diabetes - 2011,Diabetes XLKG,4010,27  (Suppl 1):S11-S61.* 02/20/2011    Urine Drug Screen:   No results found for this basename: labopia, cocainscrnur, labbenz, amphetmu, thcu, labbarb    Alcohol Level: No results found for this basename: ETH,  in the last 168 hours   CT of the brain  01/31/2014   No definite acute cortical infarction. Periventricular and patchy subcortical white matter decreased attenuation probable due to chronic small vessel ischemic changes. No mass lesion is noted on this unenhanced scan. No intracranial  hemorrhage, mass effect or midline shift.     MRI of the brain    MRA of the brain    2D Echocardiogram    Carotid Doppler    CXR  01/31/2014    1. Cardiomegaly without pulmonary edema. 2. Bronchitic changes.     EKG  normal sinus rhythm. For complete results please see formal report.   Therapy Recommendations   Physical Exam   Mental Status:  Alert, oriented, thought content appropriate. Speech dysarthric without evidence of aphasia. Able to follow 3 step commands without difficulty.  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, right pupil, round, reactive to light  III,IV, VI: ptosis not present, extra-ocular motions intact bilaterally  V,VII: smile symmetric with a slight left NL fold decrease at rest, facial light touch sensation normal bilaterally  VIII: hearing normal bilaterally  IX,X: gag reflex present  XI: bilateral shoulder shrug  XII: midline tongue extension without atrophy or fasciculations  Motor:  Right : Upper extremity 5/5 Left: Upper extremity 5/5  Lower extremity 5/5 Lower extremity 5/5  Tone and bulk:normal tone throughout; no atrophy noted  Sensory: Pinprick and light touch intact throughout, bilaterally  Plantars:  Right: downgoing Left: downgoing  Cerebellar:  normal finger-to-nose, normal heel-to-shin test     ASSESSMENT Sonya [name] is a 80 y.o. female presenting with dysarthria. Imaging pending. Suspect acute stroke.  On aspirin 81 mg orally every day prior to admission. Now on aspirin 81 mg orally every day for secondary stroke prevention. Patient with resultant dysarthria. Work up underway.   Hx TIA, stroke with symptoms resolved 2008 hypertension  Hyperlipidemia, LDL 129, on no statin PTA, now on no statin, goal LDL < 100  CAD - MI 2012, Stent 2536 Chronic diastolic heart failure Family hx stroke (father)  Hospital day # 1  TREATMENT/PLAN  Continue aspirin  81 mg orally every day for secondary stroke prevention for now.  Follow up stroke workup - 2D, Carotid doppler, MRI  OOB, therapy evals  Burnetta Sabin, MSN, RN, ANVP-BC, ANP-BC, GNP-BC [facility] Pager: [phone] 02/01/2014 9:46 AM  I have personally obtained a history, examined the patient, evaluated imaging results, and formulated the assessment and plan of care. I agree with the above.  Jim Like, DO Neurology-Stroke

## 2014-02-01 NOTE — Progress Notes (Signed)
Echocardiogram 2D Echocardiogram has been performed.  Tanya Marvin 02/01/2014, 3:33 PM

## 2014-02-01 NOTE — Progress Notes (Signed)
OT Cancellation Note  [name] MRN: [id number] DOB: 1913/10/16   Cancelled Treatment:    Reason Eval/Treat Not Completed: Fatigue/lethargy limiting ability to participate - pt just finished with PT and requests OT return at later time as she is fatigued.   Lucille Passy, OTR/L [phone]  02/01/2014, 12:16 PM

## 2014-02-01 NOTE — Evaluation (Signed)
Physical Therapy Evaluation [name] MRN: [id number] DOB: 1913/08/13 Today's Date: 02/01/2014 Time: 4782-9562 PT Time Calculation (min): 26 min  PT Assessment / Plan / Recommendation History of Present Illness  Sonya [name] is an 80 y.o. female who woke up yesterday morning 01/30/2014 and noted her speech was slurred. She did not seek medical attention at that time thinking it would clear. She went to sleep and woke up this AM 01/31/2014 with continued slurred speech. Due to her symptoms not resolving she came to ED. Her neighbor who is at bedside states her speech has improved but still is not back to baseline. The only other complaint she had was generalized weakness. She is on ASA 81 mg daily and states she has sever GI upset with Plavix.    Clinical Impression  Patient demonstrates deficits in functional mobility as indicated below. Will benefit from continued skilled PT to address deficits and maximize function. Will see as indicated and progress as tolerated.  Recommend SNF as patient with decline in functional status compared to baseline and currently requiring increased assist for all aspects of mobility. Significant fall risk at this time.    PT Assessment  Patient needs continued PT services    Follow Up Recommendations  SNF          Equipment Recommendations  None recommended by PT       Frequency Min 2X/week    Precautions / Restrictions Precautions Precautions: Fall   Pertinent Vitals/Pain No pain at this time      Mobility  Bed Mobility Overal bed mobility: Needs Assistance Bed Mobility: Supine to Sit;Sit to Supine Supine to sit: Mod assist Sit to supine: Mod assist General bed mobility comments: Use of chuck pad to bring patient to EOB Transfers Overall transfer level: Needs assistance Equipment used: Rolling walker (2 wheeled) Transfers: Sit to/from Stand Sit to Stand: Mod assist General transfer comment: VCs for hand  placement, heavy reliance on assist to come to standing, some posterior lean, assist for stability Ambulation/Gait Ambulation/Gait assistance: Mod assist Ambulation Distance (Feet): 18 Feet Assistive device: Rolling walker (2 wheeled) Gait Pattern/deviations: Decreased stride length;Shuffle;Trunk flexed;Narrow base of support;Leaning posteriorly Gait velocity: decreased significantly Gait velocity interpretation: <1.8 ft/sec, indicative of risk for recurrent falls General Gait Details: very effortful to perform ambulation, pt reports significantly different from baseline Modified Rankin (Stroke Patients Only) Pre-Morbid Rankin Score: Moderate disability Modified Rankin: Moderately severe disability    Exercises     PT Diagnosis: Difficulty walking;Abnormality of gait;Generalized weakness  PT Problem List: Decreased strength;Decreased activity tolerance;Decreased balance;Decreased mobility;Decreased coordination PT Treatment Interventions: DME instruction;Gait training;Functional mobility training;Therapeutic activities;Therapeutic exercise;Balance training;Patient/family education     PT Goals(Current goals can be found in the care plan section) Acute Rehab PT Goals Patient Stated Goal: to be able to do things by herself PT Goal Formulation: With patient Time For Goal Achievement: 02/15/14 Potential to Achieve Goals: Fair  Visit Information  Last PT Received On: 02/01/14 Assistance Needed: +1 Reason Eval/Treat Not Completed: Patient at procedure or test/unavailable History of Present Illness: Sonya [name] is an 80 y.o. female who woke up yesterday morning 01/30/2014 and noted her speech was slurred. She did not seek medical attention at that time thinking it would clear. She went to sleep and woke up this AM 01/31/2014 with continued slurred speech. Due to her symptoms not resolving she came to ED. Her neighbor who is at bedside states her speech has improved but still is not back  to  baseline. The only other complaint she had was generalized weakness. She is on ASA 81 mg daily and states she has sever GI upset with Plavix.         Prior [place] expects to be discharged to:: Private residence Living Arrangements: Alone Type of Home: Independent living facility Home Access: Level entry [place]: One [place]: [place] - 4 wheels;Cane - single point;Grab bars - toilet;Grab bars - tub/shower Additional Comments: walk in shower, raised Prior Function Level of Independence: Independent with assistive device(s) Dominant Hand: Right    Cognition  Cognition Arousal/Alertness: Awake/alert Overall Cognitive Status: Within Functional Limits for tasks assessed    Extremity/Trunk Assessment Lower Extremity Assessment Lower Extremity Assessment: Generalized weakness Cervical / Trunk Assessment Cervical / Trunk Assessment: Kyphotic   Balance Balance Overall balance assessment: Needs assistance Sitting-balance support: Feet supported Sitting balance-Leahy Scale: Fair Postural control: Posterior lean Standing balance support: Bilateral upper extremity supported;During functional activity Standing balance-Leahy Scale: Fair  End of Session PT - End of Session Equipment Utilized During Treatment: Gait belt Activity Tolerance: Patient limited by fatigue Patient left: in bed;with call bell/phone within reach;with family/visitor present Nurse Communication: Mobility status  GP Functional Assessment Tool Used: clinical judgement Functional Limitation: Mobility: Walking and moving around Mobility: Walking and Moving Around Current Status ([id number]): At least 40 percent but less than 60 percent impaired, limited or restricted   Duncan Dull 02/01/2014, 1:33 PM Alben Deeds, [place] DPT  [phone]

## 2014-02-02 DIAGNOSIS — R4789 Other speech disturbances: Secondary | ICD-10-CM | POA: Diagnosis not present

## 2014-02-02 DIAGNOSIS — R339 Retention of urine, unspecified: Secondary | ICD-10-CM | POA: Clinically undetermined

## 2014-02-02 DIAGNOSIS — I6350 Cerebral infarction due to unspecified occlusion or stenosis of unspecified cerebral artery: Secondary | ICD-10-CM | POA: Diagnosis not present

## 2014-02-02 DIAGNOSIS — I10 Essential (primary) hypertension: Secondary | ICD-10-CM | POA: Diagnosis not present

## 2014-02-02 LAB — BASIC METABOLIC PANEL
BUN: 25 mg/dL — AB (ref 6–23)
CO2: 23 mEq/L (ref 19–32)
Calcium: 8.7 mg/dL (ref 8.4–10.5)
Chloride: 103 mEq/L (ref 96–112)
Creatinine, Ser: 0.77 mg/dL (ref 0.50–1.10)
GFR calc Af Amer: 78 mL/min — ABNORMAL LOW (ref >90)
GFR calc non Af Amer: 68 mL/min — ABNORMAL LOW (ref >90)
GLUCOSE: 122 mg/dL — AB (ref 70–99)
POTASSIUM: 4.2 meq/L (ref 3.7–5.3)
Sodium: 138 mEq/L (ref 137–147)

## 2014-02-02 LAB — URINE MICROSCOPIC-ADD ON

## 2014-02-02 LAB — URINALYSIS, ROUTINE W REFLEX MICROSCOPIC
Bilirubin Urine: NEGATIVE
Glucose, UA: NEGATIVE mg/dL
Hgb urine dipstick: NEGATIVE
Ketones, ur: NEGATIVE mg/dL
Leukocytes, UA: NEGATIVE
Nitrite: NEGATIVE
Protein, ur: >300 mg/dL — AB
Specific Gravity, Urine: 1.028 (ref 1.005–1.030)
Urobilinogen, UA: 0.2 mg/dL (ref 0.0–1.0)
pH: 6 (ref 5.0–8.0)

## 2014-02-02 MED ORDER — RESOURCE THICKENUP CLEAR PO POWD
ORAL | Status: DC | PRN
  Filled 2014-02-02 (×2): qty 125

## 2014-02-02 MED ORDER — ASPIRIN 325 MG PO TABS
325.0000 mg | ORAL_TABLET | Freq: Every day | ORAL | Status: DC
  Administered 2014-02-02 – 2014-02-04 (×3): 325 mg via ORAL
  Filled 2014-02-02 (×3): qty 1

## 2014-02-02 MED ORDER — ISOSORBIDE MONONITRATE ER 60 MG PO TB24
60.0000 mg | ORAL_TABLET | Freq: Every day | ORAL | Status: DC
  Administered 2014-02-03 – 2014-02-04 (×2): 60 mg via ORAL
  Filled 2014-02-02 (×2): qty 1

## 2014-02-02 MED ORDER — ISOSORBIDE MONONITRATE ER 30 MG PO TB24
30.0000 mg | ORAL_TABLET | Freq: Once | ORAL | Status: AC
  Administered 2014-02-02: 30 mg via ORAL
  Filled 2014-02-02: qty 1

## 2014-02-02 NOTE — Progress Notes (Signed)
Stroke Team Progress Note  HISTORY Sonya [name] is a 80 y.o. female who woke up 01/30/2014 and noted her speech was slurred. She did not seek medical attention at that time thinking it would clear. She went to sleep and woke up 01/31/2014 with continued slurred speech. Due to her symptoms not resolving she came to ED that day. Her neighbor who was at her bedside stated her speech had improved but still was not back to baseline. The only other complaint she had was generalized weakness. She was on ASA 81 mg daily and stated she has sever GI upset with Plavix.  Patient was not administerd TPA secondary to delay in arrival. She was admitted 01/31/2014 for further evaluation and treatment.  SUBJECTIVE There is a friend at the bedside this morning. She feels that the patient has more of a left lower facial droop today than when she was admitted. The patient is still dysarthric and does not feel that this has improved.  OBJECTIVE Most recent Vital Signs: Filed Vitals:   02/01/14 1900 02/01/14 2005 02/02/14 0213 02/02/14 0501  BP: 146/58 157/66 160/44 178/63  Pulse: 72 77 72 64  Temp: 98.2 F (36.8 C) 98.7 F (37.1 C) 97.8 F (36.6 C) 97 F (36.1 C)  TempSrc: Oral Oral Oral Oral  Resp: 18 18 18 18   Height:      Weight:      SpO2: 96% 98% 96% 94%   CBG (last 3)   Recent Labs  01/31/14 1307  GLUCAP 95    IV Fluid Intake:     MEDICATIONS  . aspirin  324 mg Oral Daily  . enoxaparin (LOVENOX) injection  30 mg Subcutaneous Q24H  . FLUoxetine  20 mg Oral Daily  . isosorbide mononitrate  30 mg Oral Daily  . mometasone-formoterol  2 puff Inhalation BID  . pantoprazole  40 mg Oral Daily   PRN:  acetaminophen, albuterol, loteprednol, nitroGLYCERIN, ondansetron (ZOFRAN) IV, senna-docusate  Diet:  Cardiac thin liquids Activity:  Bedrest, OOB with assistance DVT Prophylaxis:  Lovenox 30 mg sq daily   CLINICALLY SIGNIFICANT STUDIES Basic Metabolic Panel:   Recent Labs Lab  01/31/14 1110 02/02/14 0540  NA 141 138  K 4.5 4.2  CL 104 103  CO2 24 23  GLUCOSE 100* 122*  BUN 22 25*  CREATININE 0.71 0.77  CALCIUM 8.9 8.7   Liver Function Tests:   Recent Labs Lab 01/31/14 1110  AST 19  ALT 19  ALKPHOS 100  BILITOT 0.5  PROT 7.6  ALBUMIN 3.7   CBC:   Recent Labs Lab 01/31/14 1110  WBC 10.2  NEUTROABS 7.8*  HGB 14.1  HCT 42.4  MCV 91.0  PLT 356   Coagulation:   Recent Labs Lab 01/31/14 1110  LABPROT 12.7  INR 0.97   Cardiac Enzymes:   Recent Labs Lab 01/31/14 1110  TROPONINI <0.30   Urinalysis:   Recent Labs Lab 01/31/14 1303  COLORURINE YELLOW  LABSPEC 1.010  PHURINE 7.0  GLUCOSEU NEGATIVE  HGBUR NEGATIVE  BILIRUBINUR NEGATIVE  KETONESUR NEGATIVE  PROTEINUR 100*  UROBILINOGEN 0.2  NITRITE NEGATIVE  LEUKOCYTESUR NEGATIVE   Lipid Panel    Component Value Date/Time   CHOL 235* 02/01/2014 0448   TRIG 185* 02/01/2014 0448   HDL 69 02/01/2014 0448   CHOLHDL 3.4 02/01/2014 0448   VLDL 37 02/01/2014 0448   LDLCALC 129* 02/01/2014 0448   HgbA1C  Lab Results  Component Value Date   HGBA1C 6.1* 02/01/2014  Urine Drug Screen:   No results found for this basename: labopia,  cocainscrnur,  labbenz,  amphetmu,  thcu,  labbarb    Alcohol Level: No results found for this basename: ETH,  in the last 168 hours   CT of the brain   01/31/2014    No definite acute cortical infarction. Periventricular and patchy subcortical white matter decreased attenuation probable due to chronic small vessel ischemic changes. No mass lesion is noted on this unenhanced scan. No intracranial hemorrhage, mass effect or midline shift.     MRI / MRA of the brain 02/01/2014 1. Small bilateral acute lacunar infarcts in the basal ganglia/right corona radiata. Favor synchronous small vessel ischemia. No  associated mass effect or hemorrhage.  2. Advanced chronic small vessel disease, otherwise not significantly progressed since 2008.  3. Stable  and negative for age intracranial MRA.  2D Echocardiogram  ejection fraction 55-60%. No cardiac source of emboli was identified.  Carotid Doppler  Preliminary findings: Bilateral: 1-39% ICA stenosis. Vertebral artery flow is antegrade.    CXR  01/31/2014    1. Cardiomegaly without pulmonary edema. 2. Bronchitic changes.     EKG  normal sinus rhythm. For complete results please see formal report.   Therapy Recommendations - physical therapy recommends skilled nursing facility placement. OT eval pending.  Physical Exam   Mental Status:  Alert, oriented, thought content appropriate. Speech dysarthric without evidence of aphasia. Able to follow 3 step commands without difficulty.  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 Lower extremity 5/5 Lower extremity 5/5  Tone and bulk:normal tone throughout; no atrophy noted  Sensory: Pinprick and light touch intact throughout, bilaterally  Plantars:  Right: downgoing Left: downgoing  Cerebellar:  normal finger-to-nose, normal heel-to-shin test     ASSESSMENT Sonya [name] is a 80 y.o. female presenting with dysarthria. No TPA secondary to delay in arrival. MRI - Small bilateral acute lacunar infarcts in the basal ganglia/right corona radiata. Infarcts felt secondary to small vessel disease.  On aspirin 81 mg orally every day prior to admission. Now on aspirin 325 mg daily  for secondary stroke prevention. Patient with resultant dysarthria. Work up underway.   Hx TIA, stroke with symptoms resolved 2008 hypertension  Hyperlipidemia, LDL 129, on no statin PTA, now on no statin, goal LDL < 100  CAD - MI 2012, Stent 1996 Chronic diastolic heart failure Family hx stroke (father)  Hospital day # 2  TREATMENT/PLAN  Aspirin was increased to 325 mg daily. The patient is not tolerated Plavix secondary to GI upset.  OOB - await occupational therapy evaluation. Physical therapist recommends skilled nursing facility placement.  Stroke will sign off, please call with any further questions  Hassel Neth [facility] Pager [phone] 02/02/2014, 8:38 AM  I have personally obtained a history, examined the patient, evaluated imaging results, and formulated the assessment and plan of care. I agree with the above.   Elspeth Cho,  DO Neurology-Stroke

## 2014-02-02 NOTE — Progress Notes (Signed)
[facility] PROGRESS NOTE  Sonya [name] OBS:[id number] DOB: June 08, 1913 DOA: 01/31/2014 PCP: Estill Dooms, MD  Assessment/Plan: #1 dysarthria/probable acute CVA Patient presented with dysarthria. MRI MRA with small bilateral acute lacunar infarcts in the basal ganglia/right corona radiata . 2-D echo pending. Carotid Doppler with no significant ICA stenosis. Fasting lipid panel pending. Continue aspirin for secondary stroke prevention. Neurology following and appreciate input and recommendations. PT/OT/ST.  #2 hypertension In crease Imdur to 60mg  dailsy. Allow permissive hypertension. Follow.  #3 chronic asthma Stable. Continue Advair.  #4 chronic diastolic heart failure/coronary artery disease Currently stable. Patient is currently euvolemic. 2-D echo with EF of 55-60% with no wall motion abnormalities. No source of emboli. Beta blocker on hold secondary to bradycardi. Follow.  #5 prophylaxis Protonix for GI prophylaxis, Lovenox for DVT prophylaxis.  Code Status: DO NOT RESUSCITATE Family Communication: Updated patient and her friend at bedside. Disposition Plan: Home versus SNF when medically stable   Consultants:  Neurology: Dr. Nicole Kindred 01/31/2014  Procedures:  MRI/MRA 02/01/2014  CT of 01/31/2014  Chest x-ray 01/31/2014  2-D echo 02/01/2014    Antibiotics:  None  HPI/Subjective: Patient states speech has not improved. Per nursing patient coughing when eating.  Objective: Filed Vitals:   02/02/14 0900  BP: 191/66  Pulse: 70  Temp: 97.4 F (36.3 C)  Resp: 18    Intake/Output Summary (Last 24 hours) at 02/02/14 1054 Last data filed at 02/02/14 0900  Gross per 24 hour  Intake    260 ml  Output      0 ml  Net    260 ml   Filed Weights   01/31/14 1825  Weight: 66.225 kg (146 lb)    Exam:   General:  NAD  Cardiovascular: RRR  Respiratory: CTAB  Abdomen: Soft, nontender, nondistended, positive bowel sounds.  Musculoskeletal: No  clubbing cyanosis or edema  Data Reviewed: Basic Metabolic Panel:  Recent Labs Lab 01/31/14 1110 02/02/14 0540  NA 141 138  K 4.5 4.2  CL 104 103  CO2 24 23  GLUCOSE 100* 122*  BUN 22 25*  CREATININE 0.71 0.77  CALCIUM 8.9 8.7   Liver Function Tests:  Recent Labs Lab 01/31/14 1110  AST 19  ALT 19  ALKPHOS 100  BILITOT 0.5  PROT 7.6  ALBUMIN 3.7   No results found for this basename: LIPASE, AMYLASE,  in the last 168 hours No results found for this basename: AMMONIA,  in the last 168 hours CBC:  Recent Labs Lab 01/31/14 1110  WBC 10.2  NEUTROABS 7.8*  HGB 14.1  HCT 42.4  MCV 91.0  PLT 356   Cardiac Enzymes:  Recent Labs Lab 01/31/14 1110  TROPONINI <0.30   BNP (last 3 results)  Recent Labs  01/31/14 2256  PROBNP 1230.0*   CBG:  Recent Labs Lab 01/31/14 1307  GLUCAP 95    No results found for this or any previous visit (from the past 240 hour(s)).   Studies: Dg Chest 2 View  01/31/2014   CLINICAL DATA:  Stroke. No chest pain or shortness of breath. Cough when drinking fluids. Weakness and slurred speech.  EXAM: CHEST  2 VIEW  COMPARISON:  Chest x-ray on 09/28/2012  FINDINGS: The heart is enlarged. There is perihilar peribronchial thickening. No focal consolidations or pleural effusions are identified. No pulmonary edema. Moderate hiatal hernia contains an air-fluid level. Degenerative changes are seen in the thoracic spine. Chronic changes are noted in the right shoulder.  IMPRESSION: 1. Cardiomegaly without pulmonary  edema. 2. Bronchitic changes.   Electronically Signed   By: Shon Hale M.D.   On: 01/31/2014 20:07   Ct Head Wo Contrast  01/31/2014   CLINICAL DATA:  Slurred speech, aphasia  EXAM: CT HEAD WITHOUT CONTRAST  TECHNIQUE: Contiguous axial images were obtained from the base of the skull through the vertex without intravenous contrast.  COMPARISON:  09/14/2007  FINDINGS: No skull fracture is noted. Paranasal sinuses shows probable mucous  retention cyst anterior aspect of left maxillary sinus measures 1.1 cm. The mastoid air cells are unremarkable.  No definite acute cortical infarction. Periventricular and patchy subcortical white matter decreased attenuation probable due to chronic small vessel ischemic changes. No mass lesion is noted on this unenhanced scan. No intracranial hemorrhage, mass effect or midline shift.  IMPRESSION: No definite acute cortical infarction. Periventricular and patchy subcortical white matter decreased attenuation probable due to chronic small vessel ischemic changes. No mass lesion is noted on this unenhanced scan. No intracranial hemorrhage, mass effect or midline shift.   Electronically Signed   By: Lahoma Crocker M.D.   On: 01/31/2014 12:03   Mr Brain Wo Contrast  02/01/2014   CLINICAL DATA:  80 year old female who awoke with slurred speech yesterday. Initial encounter. Generalized weakness.  EXAM: MRI HEAD WITHOUT CONTRAST  MRA HEAD WITHOUT CONTRAST  TECHNIQUE: Multiplanar, multiecho pulse sequences of the brain and surrounding structures were obtained without intravenous contrast. Angiographic images of the head were obtained using MRA technique without contrast.  COMPARISON:  Head CT without contrast 01/31/2014. Brain MRI and MRA 09/14/2007.  FINDINGS: MRI HEAD FINDINGS  10-12 mm area of restricted diffusion in the right corona radiata, tracking to the right putamen. Series 400, image 19. At the same time there is a 7 mm focus of restricted diffusion in the left putamen (series 4 and series 400, image 16. Both of these areas appear acute.  No other restricted diffusion. Major intracranial vascular flow voids are stable, with dominant distal right vertebral artery.  Moderate to severe and confluent T2 and FLAIR hyperintensity in the cerebral white matter and deep gray matter nuclei, fairly stable since 2008. There are new chronic lacunar infarcts in the lateral left thalamus. A chronic lacunar infarct in the right  pons may be mildly enlarged. No acute intracranial hemorrhage identified. No midline shift, mass effect, or evidence of intracranial mass lesion. No ventriculomegaly. Stable cerebral volume. Negative pituitary, cervicomedullary junction and visualized cervical spine. Normal bone marrow signal.  Stable orbits soft tissues. Stable paranasal sinuses and mastoids. Visualized scalp soft tissues are within normal limits.  MRA HEAD FINDINGS  Antegrade flow in the posterior circulation appears to be stable. Dominant distal right vertebral artery supplying the basilar. Normal right PICA origin. Suspect that the non dominant left vertebral artery terminates in PICA, but is not well visualized today. Basilar artery irregularity but no definite hemodynamically significant basilar artery stenosis. Normal SCA and right PCA origins. Fetal type left PCA origin re- identified. Right posterior communicating artery diminutive or absent. Bilateral PCA branches show increased irregularity but preserved distal flow.  Antegrade flow in both ICA siphons appear stable. No ICA stenosis. Normal ophthalmic and left posterior communicating artery origins. Carotid termini are stable and within normal limits. MCA and ACA origins remain normal. Anterior communicating artery is diminutive. Visualized ACA branches are within normal limits. Visualized bilateral MCA branches are stable and normal.  IMPRESSION: 1. Small bilateral acute lacunar infarcts in the basal ganglia/right corona radiata. Favor synchronous small vessel ischemia. No  associated mass effect or hemorrhage. 2. Advanced chronic small vessel disease, otherwise not significantly progressed since 2008. 3. Stable and negative for age intracranial MRA.   Electronically Signed   By: Lars Pinks M.D.   On: 02/01/2014 13:41   Mr Jodene Nam Head/brain Wo Cm  02/01/2014   CLINICAL DATA:  80 year old female who awoke with slurred speech yesterday. Initial encounter. Generalized weakness.  EXAM: MRI  HEAD WITHOUT CONTRAST  MRA HEAD WITHOUT CONTRAST  TECHNIQUE: Multiplanar, multiecho pulse sequences of the brain and surrounding structures were obtained without intravenous contrast. Angiographic images of the head were obtained using MRA technique without contrast.  COMPARISON:  Head CT without contrast 01/31/2014. Brain MRI and MRA 09/14/2007.  FINDINGS: MRI HEAD FINDINGS  10-12 mm area of restricted diffusion in the right corona radiata, tracking to the right putamen. Series 400, image 19. At the same time there is a 7 mm focus of restricted diffusion in the left putamen (series 4 and series 400, image 16. Both of these areas appear acute.  No other restricted diffusion. Major intracranial vascular flow voids are stable, with dominant distal right vertebral artery.  Moderate to severe and confluent T2 and FLAIR hyperintensity in the cerebral white matter and deep gray matter nuclei, fairly stable since 2008. There are new chronic lacunar infarcts in the lateral left thalamus. A chronic lacunar infarct in the right pons may be mildly enlarged. No acute intracranial hemorrhage identified. No midline shift, mass effect, or evidence of intracranial mass lesion. No ventriculomegaly. Stable cerebral volume. Negative pituitary, cervicomedullary junction and visualized cervical spine. Normal bone marrow signal.  Stable orbits soft tissues. Stable paranasal sinuses and mastoids. Visualized scalp soft tissues are within normal limits.  MRA HEAD FINDINGS  Antegrade flow in the posterior circulation appears to be stable. Dominant distal right vertebral artery supplying the basilar. Normal right PICA origin. Suspect that the non dominant left vertebral artery terminates in PICA, but is not well visualized today. Basilar artery irregularity but no definite hemodynamically significant basilar artery stenosis. Normal SCA and right PCA origins. Fetal type left PCA origin re- identified. Right posterior communicating artery  diminutive or absent. Bilateral PCA branches show increased irregularity but preserved distal flow.  Antegrade flow in both ICA siphons appear stable. No ICA stenosis. Normal ophthalmic and left posterior communicating artery origins. Carotid termini are stable and within normal limits. MCA and ACA origins remain normal. Anterior communicating artery is diminutive. Visualized ACA branches are within normal limits. Visualized bilateral MCA branches are stable and normal.  IMPRESSION: 1. Small bilateral acute lacunar infarcts in the basal ganglia/right corona radiata. Favor synchronous small vessel ischemia. No associated mass effect or hemorrhage. 2. Advanced chronic small vessel disease, otherwise not significantly progressed since 2008. 3. Stable and negative for age intracranial MRA.   Electronically Signed   By: Lars Pinks M.D.   On: 02/01/2014 13:41    Scheduled Meds: . aspirin  325 mg Oral Daily  . enoxaparin (LOVENOX) injection  30 mg Subcutaneous Q24H  . FLUoxetine  20 mg Oral Daily  . isosorbide mononitrate  30 mg Oral Daily  . mometasone-formoterol  2 puff Inhalation BID  . pantoprazole  40 mg Oral Daily   Continuous Infusions:   Principal Problem:   CVA (cerebral vascular accident) vs TIA with aphasia Active Problems:   Hypertension   CAD (coronary artery disease)   Dysphasia   HTN (hypertension)   Chronic diastolic heart failure   Urinary retention    Time spent:  [facility] Pager [phone]. If 7PM-7AM, please contact night-coverage at [email or web address], password [facility] 02/02/2014, 10:54 AM  LOS: 2 days

## 2014-02-02 NOTE — Progress Notes (Signed)
Occupational Therapy Evaluation Patient Details Name: OLIVE ZMUDA MRN: [id number] DOB: 06-21-1913 Today's Date: 02/02/2014 Time: 7341-9379 OT Time Calculation (min): 29 min  OT Assessment / Plan / Recommendation History of present illness CHERISE FEDDER is an 80 y.o. female who woke up yesterday morning 01/30/2014 and noted her speech was slurred. She did not seek medical attention at that time thinking it would clear. She went to sleep and woke up this AM 01/31/2014 with continued slurred speech. Due to her symptoms not resolving she came to ED. Her neighbor who is at bedside states her speech has improved but still is not back to baseline. The only other complaint she had was generalized weakness. She is on ASA 81 mg daily and states she has sever GI upset with Plavix.     Clinical Impression   Patient with significant ADL and mobility deficits s/p CVA. Recommend SNF level of care at [facility]. Patient and pt's friend state that is discharge plan.    OT Assessment  All further OT needs can be met in the next venue of care    Follow Up Recommendations  SNF (pt is going to skilled care at [facility])    Barriers to Discharge      Equipment Recommendations  Other (comment) (tbd at snf)    Precautions / Restrictions Precautions Precautions: Fall   Pertinent Vitals/Pain No c/o pain    ADL  Eating/Feeding: Simulated;Minimal assistance Where Assessed - Eating/Feeding: Bed level Grooming: Performed;Wash/dry face;Set up Where Assessed - Grooming: Supine, head of bed up Lower Body Bathing: Performed;+1 Total assistance Where Assessed - Lower Body Bathing: Supported sitting Lower Body Dressing: Performed;+1 Total assistance Where Assessed - Lower Body Dressing: Supported sitting Toilet Transfer: Performed;Maximal assistance Armed forces technical officer Method: Engineer, water: Therapist, occupational and Hygiene: Performed;+1 Total  assistance Where Assessed - Camera operator Manipulation and Hygiene: Sit on 3-in-1 or toilet Transfers/Ambulation Related to ADLs: Patient required max A bed mobility, max A squat pivot transfer to [facility] this date. Due to fatigue, needed +2 A to transfer from [facility] back to bed after several attempts with +1 total A. ADL Comments: Dependent don socks, dependent toilet hygiene    OT Diagnosis: Generalized weakness  OT Problem List: Decreased strength;Decreased activity tolerance;Impaired balance (sitting and/or standing);Decreased safety awareness;Decreased knowledge of use of DME or AE OT Treatment Interventions:     OT Goals(Current goals can be found in the care plan section) Acute Rehab OT Goals Patient Stated Goal: to be able to do things by herself  Visit Information  Last OT Received On: 02/02/14 Assistance Needed: +1 History of Present Illness: CHARLAINE UTSEY is an 80 y.o. female who woke up yesterday morning 01/30/2014 and noted her speech was slurred. She did not seek medical attention at that time thinking it would clear. She went to sleep and woke up this AM 01/31/2014 with continued slurred speech. Due to her symptoms not resolving she came to ED. Her neighbor who is at bedside states her speech has improved but still is not back to baseline. The only other complaint she had was generalized weakness. She is on ASA 81 mg daily and states she has sever GI upset with Plavix.         Prior [facility]ron expects to be discharged to:: Private residence Living Arrangements: Alone Type of Home: Independent living facility Home Access: Level entry Home Layout: One level Home Equipment: Walker - 4  wheels;Cane - single point;Grab bars - toilet;Grab bars - tub/shower Additional Comments: walk in shower, raised Prior Function Level of Independence: Independent with assistive device(s) Communication Communication: Other (comment) (dysarthric) Dominant Hand:  Right         Vision/Perception Vision - History Baseline Vision: Wears glasses all the time Visual History: Macular degeneration;Other (comment) (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)   Cognition  Cognition Arousal/Alertness: Awake/alert Overall Cognitive Status: Within Functional Limits for tasks assessed    Extremity/Trunk Assessment Upper Extremity Assessment Upper Extremity Assessment: LUE deficits/detail LUE Deficits / Details: L grip strength weak due to carpal tunnel Lower Extremity Assessment Lower Extremity Assessment: Generalized weakness     End of Session OT - End of Session Activity Tolerance: Patient limited by fatigue;Patient limited by lethargy Patient left: in bed;with call bell/phone within reach;with bed alarm set;with nursing/sitter in room Nurse Communication: Mobility status  GO     Etta Gassett A 02/02/2014, 3:55 PM

## 2014-02-02 NOTE — Evaluation (Signed)
Clinical/Bedside Swallow Evaluation Patient Details  Name: Sonya [name] MRN: [id number] Date of Birth: [birth year]  Today's Date: 02/02/2014 Time: 1225-1250 SLP Time Calculation (min): 25 min  Past Medical History:  Past Medical History  Diagnosis Date  . Acute bronchitis   . Asthma   . TIA (transient ischemic attack)   . Herpes zoster complication 0814    complicated, left eye involvement  . Nontoxic uninodular goiter     negative [place] 2008  . Macular degeneration (senile) of retina, unspecified 2012    wet  . Acute myocardial infarction, unspecified site, episode of care unspecified 03/07/11  . Coronary atherosclerosis of native coronary artery      s/p stent diagonal branch 1996; RCA 03/07/11  . Chronic diastolic heart failure   . Cardiomegaly   . Diaphragmatic hernia without mention of obstruction or gangrene   . Irritable bowel syndrome   . Other specified genital prolapse(618.89)     corrected with pessary  . Edema 2006  . Other abnormal blood chemistry   . Debility, unspecified   . Open wound of knee, leg (except thigh), and ankle, without mention of complication 48/1856    left leg, complicated wound, extended healing, resolved 01/2013  . Transient ischemic attack (TIA), and cerebral infarction without residual deficits(V12.54) 2008  . Shoulder pain 03/30/2013  . Hypertension   . Depression   . Finger numbness 12/28/2013    Left thumb, 1, 2nd fingers   Past Surgical History:  Past Surgical History  Procedure Laterality Date  . Appendectomy    . Tonsillectomy and adenoidectomy    . Cataract extraction    . Cardiac catheterization  1996, 2001, 2012  . Carotid stent      diagonal branch of LCA   HPI:  80 y.o. female, with a pmh significant for asthma, TIAs, chronic diastolic heart failure, acute bronchitis, macular degeneration, , MI IBS, and hypertension. On 2/9 she noticed she felt "swimmy headed" but attributed it to missing her prozac dose (her refill was not  ready) so she did not mention it to anyone. Today she was supposed to have lunch at [employer] in [place] with her good friend. The friend noticed that Sonya [name] was not speaking clearly and called the RN at [employer]. Sonya [name] lives in independent living, walks with a walker and is active per her report. She takes care of her ADLs.  In the emergency room her labs appear normal, head CT is negative, aphasia has significantly improved. CXR.  She reports that she occasionally gets choked for no reason when she's eating or drinking.    Assessment / Plan / Recommendation Clinical Impression  Pt. demonstrated oropharyngeal dysphagia marked by left buccal cavity pocketing, labial residue, wet vocal quality and delayed cough at end of meal.  Pt. and daughter report recent coughing with liquids.  SLP observed pocketed eggs yesterday during speech-cognitive assessement.  Pt./daughter/MD in agreeement with plan for MBS tomorrow and diet modification to Dys 1 texture and nectar thick liquids, pills whole in applesauce.  Educated pt. and daughter re: swallow precautions and clinical rationale for recommendations.    Aspiration Risk  Moderate    Diet Recommendation Dysphagia 1 (Puree);Nectar-thick liquid   Liquid Administration via: Cup;No straw Medication Administration: Whole meds with puree Supervision: Patient able to self feed;Full supervision/cueing for compensatory strategies Compensations: Slow rate;Small sips/bites Postural Changes and/or Swallow Maneuvers: Seated upright 90 degrees    Other  Recommendations Recommended Consults: MBS Oral Care Recommendations: Oral care BID  Other Recommendations: Order thickener from pharmacy   Follow Up Recommendations  Home health SLP    Frequency and Duration min 2x/week  2 weeks   Pertinent Vitals/Pain WDL         Swallow Study         Oral/Motor/Sensory Function Overall Oral Motor/Sensory Function: Impaired Labial ROM: Reduced left Labial  Symmetry: Abnormal symmetry left Labial Strength: Reduced Labial Sensation: Reduced Lingual ROM: Within Functional Limits Lingual Symmetry: Within Functional Limits Lingual Strength: Within Functional Limits Facial ROM: Reduced left Mandible: Within Functional Limits   Ice Chips Ice chips: Not tested   Thin Liquid Thin Liquid: Impaired Oral Phase Impairments: Reduced labial seal Oral Phase Functional Implications:  (left labial residue) Pharyngeal  Phase Impairments: Cough - Delayed;Wet Vocal Quality    Nectar Thick Nectar Thick Liquid: Not tested   Honey Thick Honey Thick Liquid: Not tested   Puree Puree: Impaired Oral Phase Functional Implications:  (left labial residue) Pharyngeal Phase Impairments: Multiple swallows   Solid   GO    Solid:  (daughter reports "she could not eat it")       Orbie Pyo [employer] [phone]  02/02/2014

## 2014-02-02 NOTE — Clinical Social Work Psychosocial (Signed)
Clinical Social Work Department BRIEF PSYCHOSOCIAL ASSESSMENT 02/02/2014  Patient:  Sonya [name], Sonya [name]     Account Number:  [id number]     Admit date:  01/31/2014  Clinical Social Worker:  Donna Christen  Date/Time:  02/02/2014 02:31 PM  Referred by:  Physician  Date Referred:  02/02/2014 Referred for  SNF Placement   Other Referral:   none.   Interview type:  Patient Other interview type:   Pt's friend Inez Catalina was present at bedside, but CSW spoke directly to patient.    PSYCHOSOCIAL DATA Living Status:  FACILITY Admitted from facility:  OTHER Level of care:  Assisted Living Primary support name:  Inez Catalina [place] Primary support relationship to patient:  FRIEND Degree of support available:   Patient seemed independent in her own thinking and decision making. Friend at bedside appeared to be present for emotional support.  Pt reported that she has been living at [facility] for the past 10 years.    CURRENT CONCERNS Current Concerns  Post-Acute Placement   Other Concerns:   none.    SOCIAL WORK ASSESSMENT / PLAN CSW met with pt at pt's friend at bedside. CSW defined role with [facility], and pt was agreeable to speaking with CSW. CSW explained PT recommendation for SNF placement at time of discharge from [facility]. Pt stated that she was not agreeable to SNF because she would be returning to her ALF ([facility]). CSW informed pt that [facility] may not be able to accomodate pt's needs at time of discharge. Pt still not agreeable to SNF search, stating that [facility] will let pt return. CSW to fax clinical information to [facility] ALF.   Assessment/plan status:  Psychosocial Support/Ongoing Assessment of Needs Other assessment/ plan:   none.   Information/referral to community resources:   Pt wishing to return to ALF.    PATIENTS/FAMILYS RESPONSE TO PLAN OF CARE: Pt very pleasant to speak with. Pt not agreeable to SNF plan of care at discharge. Pt insistant on  returning to ALF once medically stable for discharge.       Pati Gallo, [place] Social Worker [phone]

## 2014-02-02 NOTE — Clinical Social Work Placement (Addendum)
Clinical Social Work Department CLINICAL SOCIAL WORK PLACEMENT NOTE 02/02/2014  Patient:  Sonya [name], Sonya [name]  Account Number:  [id number] Admit date:  01/31/2014  Clinical Social Worker:  Raquel Sarna [place], LCSWA  Date/time:  02/02/2014 02:37 PM  Clinical Social Work is seeking post-discharge placement for this patient at the following level of care:   SKILLED NURSING   (*CSW will update this form in Epic as items are completed)   02/02/2014  Patient/family provided with [place] Department of Clinical Social Works list of facilities offering this level of care within the geographic area requested by the patient (or if unable, by the patients family).  02/02/2014  Patient/family informed of their freedom to choose among providers that offer the needed level of care, that participate in Medicare, Medicaid or managed care program needed by the patient, have an available bed and are willing to accept the patient.  02/02/2014  Patient/family informed of [facility] ownership interest in [facility], as well as of the fact that they are under no obligation to receive care at this facility.  PASARR submitted to EDS on  PASARR number received from [place] on   FL2 transmitted to all facilities in geographic area requested by pt/family on  02/02/2014 FL2 transmitted to all facilities within larger geographic area on   Patient informed that his/her managed care company has contracts with or will negotiate with  certain facilities, including the following:     Patient/family informed of bed offers received:  02/02/2014 Patient chooses bed at [facility] in SNF Physician recommends and patient chooses bed at    Patient to be transferred to  on   Patient to be transferred to facility by   The following physician request were entered in Epic:   Additional Comments: PASARR existing  Pati Gallo, [place] Social Worker [phone]

## 2014-02-02 NOTE — Progress Notes (Signed)
Pt was able to void and was bladder scanned after voiding. Retention of 123ml remained in bladder. Urine sent to lab.

## 2014-02-03 ENCOUNTER — Inpatient Hospital Stay (HOSPITAL_COMMUNITY): Payer: Medicare Other

## 2014-02-03 DIAGNOSIS — I6350 Cerebral infarction due to unspecified occlusion or stenosis of unspecified cerebral artery: Secondary | ICD-10-CM | POA: Diagnosis not present

## 2014-02-03 DIAGNOSIS — R1310 Dysphagia, unspecified: Secondary | ICD-10-CM | POA: Diagnosis not present

## 2014-02-03 DIAGNOSIS — I10 Essential (primary) hypertension: Secondary | ICD-10-CM | POA: Diagnosis not present

## 2014-02-03 LAB — URINE CULTURE

## 2014-02-03 LAB — BASIC METABOLIC PANEL
BUN: 25 mg/dL — AB (ref 6–23)
CO2: 25 meq/L (ref 19–32)
Calcium: 8.8 mg/dL (ref 8.4–10.5)
Chloride: 105 mEq/L (ref 96–112)
Creatinine, Ser: 0.79 mg/dL (ref 0.50–1.10)
GFR calc Af Amer: 78 mL/min — ABNORMAL LOW (ref >90)
GFR, EST NON AFRICAN AMERICAN: 67 mL/min — AB (ref >90)
GLUCOSE: 133 mg/dL — AB (ref 70–99)
POTASSIUM: 5 meq/L (ref 3.7–5.3)
SODIUM: 141 meq/L (ref 137–147)

## 2014-02-03 MED ORDER — RESOURCE THICKENUP CLEAR PO POWD: 1.0000 | ORAL | Status: DC | PRN

## 2014-02-03 MED ORDER — METOPROLOL SUCCINATE ER 25 MG PO TB24
25.0000 mg | ORAL_TABLET | Freq: Every day | ORAL | Status: DC
  Administered 2014-02-03 – 2014-02-04 (×2): 25 mg via ORAL
  Filled 2014-02-03 (×3): qty 1

## 2014-02-03 MED ORDER — ASPIRIN 325 MG PO TABS: 325.0000 mg | ORAL_TABLET | Freq: Every day | ORAL | Status: DC

## 2014-02-03 MED ORDER — ROSUVASTATIN CALCIUM 5 MG PO TABS: 5.0000 mg | ORAL_TABLET | Freq: Every day | ORAL | Status: DC

## 2014-02-03 NOTE — Discharge Summary (Signed)
Physician Discharge Summary  Sonya [name] YTW:[id number] DOB: [birth year] DOA: 01/31/2014  PCP: Estill Dooms, MD  Admit date: 01/31/2014 Discharge date: 02/04/2014  Time spent: 70 minutes  Recommendations for Outpatient Follow-up:  1. Followup with Dr. Leonie Man in 2 months. 2. Followup with M.D. at the skilled nursing facility. On followup basic metabolic profile needs to be obtained in the week. Patient will need to be followed by speech therapy as well as physical therapy and occupational therapy at the skilled nursing facility.  Discharge Diagnoses:  Principal Problem:   CVA (cerebral vascular accident) Active Problems:   Hypertension   CAD (coronary artery disease)   Dysphasia   HTN (hypertension)   Chronic diastolic heart failure   Urinary retention   Discharge Condition: Stable and improved  Diet recommendation: Dysphagia 1 diet with nectar thick liquids/heart healthy  Filed Weights   01/31/14 1825  Weight: 66.225 kg (146 lb)    History of present illness:  Sonya [name] is an 80 y.o. female who woke up yesterday morning and noted her speech was slurred. She did not seek medical attention at that time thinking it would clear. She went to sleep and woke up this AM with continued slurred speech. Due to her symptoms not resolving she came to ED. Her neighbor who is at bedside states her speech has improved but still is not back to baseline. The only other complaint she had was generalized weakness. She is on ASA 81 mg daily and states she has sever GI upset with Plavix.    Hospital Course:  #1 dysarthria/probable acute CVA  Patient presented with dysarthria and slurred speech. CT head negative for acute infarct. MRI MRA with small bilateral acute lacunar infarcts in the basal ganglia/right corona radiata . 2-D echo with no source of emboli. Carotid Doppler with no significant ICA stenosis. Fasting lipid panel with LDL 129. Patient was placed on ASA 325mg  daily  for  secondary stroke prevention. pending. Patient noted to have intolerance to plavix and as such placed on ASA per neurology recommendations. Neurology ff throughout the hospitalization. Patient with an intolerance to statins and will be started on low dose crestor 5 mg daily. Patient remained in stable condition was seen by speech therapy. Patient was discharged to a skilled nursing facility with PT/OT/ST following.  #2 hypertension  On admission patient's antihypertensive medications were held to allow for permissive hypertension cerebral perfusion secondary to problem #1. Patient's antihypertensive medications were slowly started back to her home regimen. Patient will followup in M.D. at the skilled nursing facility.  #3 chronic asthma  Stable. Continued on Advair.  #4 chronic diastolic heart failure/coronary artery disease  Currently stable. Patient is currently euvolemic. 2-D echo with EF of 55-60% with no wall motion abnormalities. No source of emboli. Beta blocker resumed on home regimen.  #5 dysphagia Patient was noted to have bouts of coughing during the hospitalization, while eating. Speech therapy evaluated patient patient underwent a modified barium swallow. It was recommended that patient be continued on nectar thick liquids with dysphagia 1 diet with aspiration precautions an outpatient followup with speech therapy. #6 urinary retention Patient had some complaints of not being able to void. Urinalysis which was done was negative for UTI. Patient underwent a bladder scan with I/O cath. Patient was able to urinate and this may to be followed up as outpatient.   The rest of patient's chronic medical issues remain stable throughout the hospitalization patient be discharged in stable and improved condition.  Procedures: MRI/MRA 02/01/2014  CT of 01/31/2014  Chest x-ray 01/31/2014  2-D echo 02/01/2014 Carotid Dopplers 02/01/2014  Modified barium swallow  02/03/2014   Consultations: Neurology: Dr. Nicole Kindred 01/31/2014   Discharge Exam: Filed Vitals:   02/03/14 1420  BP: 122/63  Pulse: 94  Temp: 98.3 F (36.8 C)  Resp: 20    General: NAD. Dysarthria Cardiovascular: RRR Respiratory: CTAB  Discharge Instructions      Discharge Orders   Future Appointments Provider Department Dept Phone   03/29/2014 2:00 PM Mardene Celeste, NP [facility] [phone]   Future Orders Complete By Expires   Diet - low sodium heart healthy  As directed    Scheduling Instructions:     Dysphagia 1 diet with nectar thick liquids and aspiration precautions.   Discharge instructions  As directed    Comments:     Follow up with Dr Angela Adam in 2 months. Follow up with MD at SNF Will need SLP/PT/OT at SNF   Increase activity slowly  As directed        Medication List         ADVAIR DISKUS 250-50 MCG/DOSE Aepb  Generic drug:  Fluticasone-Salmeterol  Inhale 1 puff into the lungs every 12 (twelve) hours.     aspirin 325 MG tablet  Take 1 tablet (325 mg total) by mouth daily.     FLUoxetine 20 MG capsule  Commonly known as:  PROZAC  Take 20 mg by mouth daily.     ICAPS PO  Take 1 tablet by mouth daily.     isosorbide mononitrate 60 MG 24 hr tablet  Commonly known as:  IMDUR  Take 60 mg by mouth at bedtime.     loteprednol 0.5 % ophthalmic suspension  Commonly known as:  LOTEMAX  Place 1 drop into the left eye. One drop in left eye 1-2 times daily as needed     metoprolol succinate 25 MG 24 hr tablet  Commonly known as:  TOPROL-XL  Take 25 mg by mouth daily.     NEXIUM 40 MG capsule  Generic drug:  esomeprazole  Take 1 capsule by mouth every evening.     nitroGLYCERIN 0.4 MG SL tablet  Commonly known as:  NITROSTAT  Place 0.4 mg under the tongue every 5 (five) minutes as needed for chest pain.     RESOURCE THICKENUP CLEAR Powd  Take 120 g by mouth as needed.     rosuvastatin 5 MG tablet  Commonly known as:  CRESTOR   Take 1 tablet (5 mg total) by mouth daily.       Allergies  Allergen Reactions  . Plavix [Clopidogrel] Nausea And Vomiting  . Statins Other (See Comments)    Leg cramps   Follow-up Information   Follow up with Forbes Cellar, MD In 2 months. (Please call the office in 1-2 days to be seen in one to 2 months.)    Specialties:  Neurology, Radiology   Contact information:   [address] [place] [address] [phone]       Follow up In 1 week. (f/u with MD at SNF.)        The results of significant diagnostics from this hospitalization (including imaging, microbiology, ancillary and laboratory) are listed below for reference.    Significant Diagnostic Studies: Dg Chest 2 View  01/31/2014   CLINICAL DATA:  Stroke. No chest pain or shortness of breath. Cough when drinking fluids. Weakness and slurred speech.  EXAM: CHEST  2 VIEW  COMPARISON:  Chest x-ray on 09/28/2012  FINDINGS: The heart is enlarged. There is perihilar peribronchial thickening. No focal consolidations or pleural effusions are identified. No pulmonary edema. Moderate hiatal hernia contains an air-fluid level. Degenerative changes are seen in the thoracic spine. Chronic changes are noted in the right shoulder.  IMPRESSION: 1. Cardiomegaly without pulmonary edema. 2. Bronchitic changes.   Electronically Signed   By: Shon Hale M.D.   On: 01/31/2014 20:07   Ct Head Wo Contrast  01/31/2014   CLINICAL DATA:  Slurred speech, aphasia  EXAM: CT HEAD WITHOUT CONTRAST  TECHNIQUE: Contiguous axial images were obtained from the base of the skull through the vertex without intravenous contrast.  COMPARISON:  09/14/2007  FINDINGS: No skull fracture is noted. Paranasal sinuses shows probable mucous retention cyst anterior aspect of left maxillary sinus measures 1.1 cm. The mastoid air cells are unremarkable.  No definite acute cortical infarction. Periventricular and patchy subcortical white matter decreased  attenuation probable due to chronic small vessel ischemic changes. No mass lesion is noted on this unenhanced scan. No intracranial hemorrhage, mass effect or midline shift.  IMPRESSION: No definite acute cortical infarction. Periventricular and patchy subcortical white matter decreased attenuation probable due to chronic small vessel ischemic changes. No mass lesion is noted on this unenhanced scan. No intracranial hemorrhage, mass effect or midline shift.   Electronically Signed   By: Lahoma Crocker M.D.   On: 01/31/2014 12:03   Mr Brain Wo Contrast  02/01/2014   CLINICAL DATA:  80 year old female who awoke with slurred speech yesterday. Initial encounter. Generalized weakness.  EXAM: MRI HEAD WITHOUT CONTRAST  MRA HEAD WITHOUT CONTRAST  TECHNIQUE: Multiplanar, multiecho pulse sequences of the brain and surrounding structures were obtained without intravenous contrast. Angiographic images of the head were obtained using MRA technique without contrast.  COMPARISON:  Head CT without contrast 01/31/2014. Brain MRI and MRA 09/14/2007.  FINDINGS: MRI HEAD FINDINGS  10-12 mm area of restricted diffusion in the right corona radiata, tracking to the right putamen. Series 400, image 19. At the same time there is a 7 mm focus of restricted diffusion in the left putamen (series 4 and series 400, image 16. Both of these areas appear acute.  No other restricted diffusion. Major intracranial vascular flow voids are stable, with dominant distal right vertebral artery.  Moderate to severe and confluent T2 and FLAIR hyperintensity in the cerebral white matter and deep gray matter nuclei, fairly stable since 2008. There are new chronic lacunar infarcts in the lateral left thalamus. A chronic lacunar infarct in the right pons may be mildly enlarged. No acute intracranial hemorrhage identified. No midline shift, mass effect, or evidence of intracranial mass lesion. No ventriculomegaly. Stable cerebral volume. Negative pituitary,  cervicomedullary junction and visualized cervical spine. Normal bone marrow signal.  Stable orbits soft tissues. Stable paranasal sinuses and mastoids. Visualized scalp soft tissues are within normal limits.  MRA HEAD FINDINGS  Antegrade flow in the posterior circulation appears to be stable. Dominant distal right vertebral artery supplying the basilar. Normal right PICA origin. Suspect that the non dominant left vertebral artery terminates in PICA, but is not well visualized today. Basilar artery irregularity but no definite hemodynamically significant basilar artery stenosis. Normal SCA and right PCA origins. Fetal type left PCA origin re- identified. Right posterior communicating artery diminutive or absent. Bilateral PCA branches show increased irregularity but preserved distal flow.  Antegrade flow in both ICA siphons appear stable. No ICA stenosis. Normal ophthalmic  and left posterior communicating artery origins. Carotid termini are stable and within normal limits. MCA and ACA origins remain normal. Anterior communicating artery is diminutive. Visualized ACA branches are within normal limits. Visualized bilateral MCA branches are stable and normal.  IMPRESSION: 1. Small bilateral acute lacunar infarcts in the basal ganglia/right corona radiata. Favor synchronous small vessel ischemia. No associated mass effect or hemorrhage. 2. Advanced chronic small vessel disease, otherwise not significantly progressed since 2008. 3. Stable and negative for age intracranial MRA.   Electronically Signed   By: Lars Pinks M.D.   On: 02/01/2014 13:41   Dg Swallowing Func-speech Pathology  02/03/2014   Orbie Pyo [place], CCC-SLP     02/03/2014  1:10 PM Objective Swallowing Evaluation:   Modified Barium Swallow Patient Details  Name: Sonya [name] MRN: [id number] Date of Birth: 1912-12-30  Today's Date: 02/03/2014 Time:  -     Past Medical History:  Past Medical History  Diagnosis Date  . Acute bronchitis   . Asthma   . TIA  (transient ischemic attack)   . Herpes zoster complication [id number]    complicated, left eye involvement  . Nontoxic uninodular goiter     negative [place] 2008  . Macular degeneration (senile) of retina, unspecified 2012    wet  . Acute myocardial infarction, unspecified site, episode of care  unspecified 03/07/11  . Coronary atherosclerosis of native coronary artery      s/p stent diagonal branch 1996; RCA 03/07/11  . Chronic diastolic heart failure   . Cardiomegaly   . Diaphragmatic hernia without mention of obstruction or gangrene    . Irritable bowel syndrome   . Other specified genital prolapse(618.89)     corrected with pessary  . Edema 2006  . Other abnormal blood chemistry   . Debility, unspecified   . Open wound of knee, leg (except thigh), and ankle, without  mention of complication 0000000    left leg, complicated wound, extended healing, resolved 01/2013  . Transient ischemic attack (TIA), and cerebral infarction  without residual deficits(V12.54) 2008  . Shoulder pain 03/30/2013  . Hypertension   . Depression   . Finger numbness 12/28/2013    Left thumb, 1, 2nd fingers   Past Surgical History:  Past Surgical History  Procedure Laterality Date  . Appendectomy    . Tonsillectomy and adenoidectomy    . Cataract extraction    . Cardiac catheterization  1996, 2001, 2012  . Carotid stent      diagonal branch of LCA   HPI:  80 y.o. female, with a pmh significant for asthma, TIAs, chronic  diastolic heart failure, acute bronchitis, macular degeneration,  , MI IBS, and hypertension. On 2/9 she noticed she felt "swimmy  headed" but attributed it to missing her prozac dose (her refill  was not ready) so she did not mention it to anyone. Today she was  supposed to have lunch at [employer] in [place] with her good  friend. The friend noticed that Ms. Yamasaki was not speaking  clearly and called the RN at [facility]. Ms. Gurule lives in  independent living, walks with a walker and is active per her  report. She takes care of her  ADLs.  In the emergency room her  labs appear normal, head CT is negative, aphasia has  significantly improved. CXR.  She reports that she occasionally  gets choked for no reason when she's eating or drinking.   Indications of oropharyngeal dysphagia observed by SLP, pt.  daughter  and RN.  MBS recommended.      Assessment / Plan / Recommendation Clinical Impression  Clinical impression: Pt. demonstrated moderately decreased  ability to prep, masticate and transit a solid texture bolus  resulting in delay and residue.  Moderate sensorimotor  impairments led to pharyngeal swallow delay, mild residuals and  frank aspiration of thin barium followed by reflexive cough.   Although pt.'s mentation appears fairly functional, chin tuck was  not attempted at this time due to decreased endurance and  fatigue.  Recommend she continue with nectar thick liquids and  Dys 1 (puree) texture with continued ST for safety, education and  upgrade when able.      Treatment Recommendation  Therapy as outlined in treatment plan below    Diet Recommendation Dysphagia 1 (Puree);Nectar-thick liquid   Liquid Administration via: Cup;No straw Medication Administration: Whole meds with puree Supervision: Patient able to self feed;Full supervision/cueing  for compensatory strategies Compensations: Slow rate;Small sips/bites Postural Changes and/or Swallow Maneuvers: Seated upright 90  degrees    Other  Recommendations Oral Care Recommendations: Oral care BID   Follow Up Recommendations  Home health SLP    Frequency and Duration min 2x/week  2 weeks   Pertinent Vitals/Pain WDL          Reason for Referral Objectively evaluate swallowing function   Oral Phase     Pharyngeal Phase Pharyngeal - Nectar Pharyngeal - Nectar Teaspoon: Delayed swallow  initiation;Premature spillage to valleculae Pharyngeal - Nectar Cup: Delayed swallow initiation;Premature  spillage to valleculae;Pharyngeal residue - valleculae;Reduced  tongue base retraction Pharyngeal -  Thin Pharyngeal - Thin Teaspoon: Pharyngeal residue -  valleculae;Pharyngeal residue - pyriform sinuses;Reduced  laryngeal elevation;Reduced tongue base retraction;Delayed  swallow initiation;Premature spillage to pyriform sinuses Pharyngeal - Thin Cup: Delayed swallow initiation;Premature  spillage to pyriform sinuses;Pharyngeal residue -  valleculae;Pharyngeal residue - pyriform sinuses;Reduced tongue  base retraction;Reduced laryngeal  elevation;Penetration/Aspiration during swallow Penetration/Aspiration details (thin cup): Material enters  airway, passes BELOW cords and not ejected out despite cough  attempt by patient Pharyngeal - Solids Pharyngeal - Puree: Delayed swallow initiation;Premature spillage  to valleculae  Cervical Esophageal Phase    GO              Orbie Pyo Col[place] Caroli.Ed CCC-SLP Pager [phone]  02/03/2014   Mr Jodene Nam Head/brain Wo Cm  02/01/2014   CLINICAL DATA:  80 year old female who awoke with slurred speech yesterday. Initial encounter. Generalized weakness.  EXAM: MRI HEAD WITHOUT CONTRAST  MRA HEAD WITHOUT CONTRAST  TECHNIQUE: Multiplanar, multiecho pulse sequences of the brain and surrounding structures were obtained without intravenous contrast. Angiographic images of the head were obtained using MRA technique without contrast.  COMPARISON:  Head CT without contrast 01/31/2014. Brain MRI and MRA 09/14/2007.  FINDINGS: MRI HEAD FINDINGS  10-12 mm area of restricted diffusion in the right corona radiata, tracking to the right putamen. Series 400, image 19. At the same time there is a 7 mm focus of restricted diffusion in the left putamen (series 4 and series 400, image 16. Both of these areas appear acute.  No other restricted diffusion. Major intracranial vascular flow voids are stable, with dominant distal right vertebral artery.  Moderate to severe and confluent T2 and FLAIR hyperintensity in the cerebral white matter and deep gray matter nuclei, fairly stable since 2008. There are new  chronic lacunar infarcts in the lateral left thalamus. A chronic lacunar infarct in the right pons may be mildly enlarged. No acute intracranial hemorrhage identified. No  midline shift, mass effect, or evidence of intracranial mass lesion. No ventriculomegaly. Stable cerebral volume. Negative pituitary, cervicomedullary junction and visualized cervical spine. Normal bone marrow signal.  Stable orbits soft tissues. Stable paranasal sinuses and mastoids. Visualized scalp soft tissues are within normal limits.  MRA HEAD FINDINGS  Antegrade flow in the posterior circulation appears to be stable. Dominant distal right vertebral artery supplying the basilar. Normal right PICA origin. Suspect that the non dominant left vertebral artery terminates in PICA, but is not well visualized today. Basilar artery irregularity but no definite hemodynamically significant basilar artery stenosis. Normal SCA and right PCA origins. Fetal type left PCA origin re- identified. Right posterior communicating artery diminutive or absent. Bilateral PCA branches show increased irregularity but preserved distal flow.  Antegrade flow in both ICA siphons appear stable. No ICA stenosis. Normal ophthalmic and left posterior communicating artery origins. Carotid termini are stable and within normal limits. MCA and ACA origins remain normal. Anterior communicating artery is diminutive. Visualized ACA branches are within normal limits. Visualized bilateral MCA branches are stable and normal.  IMPRESSION: 1. Small bilateral acute lacunar infarcts in the basal ganglia/right corona radiata. Favor synchronous small vessel ischemia. No associated mass effect or hemorrhage. 2. Advanced chronic small vessel disease, otherwise not significantly progressed since 2008. 3. Stable and negative for age intracranial MRA.   Electronically Signed   By: Lars Pinks M.D.   On: 02/01/2014 13:41    Microbiology: Recent Results (from the past 240 hour(s))  URINE CULTURE      Status: None   Collection Time    02/02/14  1:30 PM      Result Value Ref Range Status   Specimen Description URINE, RANDOM   Final   Special Requests NONE   Final   Culture  Setup Time     Final   Value: 02/02/2014 14:48     Performed at [facility]     Final   Value: 40,000 COLONIES/ML     Performed at [facility]   Culture     Final   Value: Multiple bacterial morphotypes present, none predominant. Suggest appropriate recollection if clinically indicated.     Performed at [facility]   Report Status 02/03/2014 FINAL   Final     Labs: Basic Metabolic Panel:  Recent Labs Lab 01/31/14 1110 02/02/14 0540 02/03/14 0600  NA 141 138 141  K 4.5 4.2 5.0  CL 104 103 105  CO2 24 23 25   GLUCOSE 100* 122* 133*  BUN 22 25* 25*  CREATININE 0.71 0.77 0.79  CALCIUM 8.9 8.7 8.8   Liver Function Tests:  Recent Labs Lab 01/31/14 1110  AST 19  ALT 19  ALKPHOS 100  BILITOT 0.5  PROT 7.6  ALBUMIN 3.7   No results found for this basename: LIPASE, AMYLASE,  in the last 168 hours No results found for this basename: AMMONIA,  in the last 168 hours CBC:  Recent Labs Lab 01/31/14 1110  WBC 10.2  NEUTROABS 7.8*  HGB 14.1  HCT 42.4  MCV 91.0  PLT 356   Cardiac Enzymes:  Recent Labs Lab 01/31/14 1110  TROPONINI <0.30   BNP: BNP (last 3 results)  Recent Labs  01/31/14 2256  PROBNP 1230.0*   CBG:  Recent Labs Lab 01/31/14 1307  GLUCAP 95       Signed:  THOMPSON,DANIEL MD [facility] 02/03/2014, 3:45 PM

## 2014-02-03 NOTE — Procedures (Signed)
Objective Swallowing Evaluation:   Modified Barium Swallow Patient Details  Name: Sonya [name] MRN: [id number] Date of Birth: 1913/12/01  Today's Date: 02/03/2014 Time:  -     Past Medical History:  Past Medical History  Diagnosis Date  . Acute bronchitis   . Asthma   . TIA (transient ischemic attack)   . Herpes zoster complication 7106    complicated, left eye involvement  . Nontoxic uninodular goiter     negative [place] 2008  . Macular degeneration (senile) of retina, unspecified 2012    wet  . Acute myocardial infarction, unspecified site, episode of care unspecified 03/07/11  . Coronary atherosclerosis of native coronary artery      s/p stent diagonal branch 1996; RCA 03/07/11  . Chronic diastolic heart failure   . Cardiomegaly   . Diaphragmatic hernia without mention of obstruction or gangrene   . Irritable bowel syndrome   . Other specified genital prolapse(618.89)     corrected with pessary  . Edema 2006  . Other abnormal blood chemistry   . Debility, unspecified   . Open wound of knee, leg (except thigh), and ankle, without mention of complication 26/9485    left leg, complicated wound, extended healing, resolved 01/2013  . Transient ischemic attack (TIA), and cerebral infarction without residual deficits(V12.54) 2008  . Shoulder pain 03/30/2013  . Hypertension   . Depression   . Finger numbness 12/28/2013    Left thumb, 1, 2nd fingers   Past Surgical History:  Past Surgical History  Procedure Laterality Date  . Appendectomy    . Tonsillectomy and adenoidectomy    . Cataract extraction    . Cardiac catheterization  1996, 2001, 2012  . Carotid stent      diagonal branch of LCA   HPI:  80 y.o. female, with a pmh significant for asthma, TIAs, chronic diastolic heart failure, acute bronchitis, macular degeneration, , MI IBS, and hypertension. On 2/9 she noticed she felt "swimmy headed" but attributed it to missing her prozac dose (her refill was not ready) so she did  not mention it to anyone. Today she was supposed to have lunch at [employer] in [place] with her good friend. The friend noticed that Ms. Aron was not speaking clearly and called the RN at [employer]. Ms. Igoe lives in independent living, walks with a walker and is active per her report. She takes care of her ADLs.  In the emergency room her labs appear normal, head CT is negative, aphasia has significantly improved. CXR.  She reports that she occasionally gets choked for no reason when she's eating or drinking.  Indications of oropharyngeal dysphagia observed by SLP, pt. daughter and RN.  MBS recommended.      Assessment / Plan / Recommendation Clinical Impression  Clinical impression: Pt. demonstrated moderately decreased ability to prep, masticate and transit a solid texture bolus resulting in delay and residue.  Moderate sensorimotor impairments led to pharyngeal swallow delay, mild residuals and frank aspiration of thin barium followed by reflexive cough.  Although pt.'s mentation appears fairly functional, chin tuck was not attempted at this time due to decreased endurance and fatigue.  Recommend she continue with nectar thick liquids and Dys 1 (puree) texture with continued ST for safety, education and upgrade when able.      Treatment Recommendation  Therapy as outlined in treatment plan below    Diet Recommendation Dysphagia 1 (Puree);Nectar-thick liquid   Liquid Administration via: Cup;No straw Medication Administration: Whole meds with puree Supervision:  Patient able to self feed;Full supervision/cueing for compensatory strategies Compensations: Slow rate;Small sips/bites Postural Changes and/or Swallow Maneuvers: Seated upright 90 degrees    Other  Recommendations Oral Care Recommendations: Oral care BID   Follow Up Recommendations  Home health SLP    Frequency and Duration min 2x/week  2 weeks   Pertinent Vitals/Pain WDL          Reason for Referral Objectively  evaluate swallowing function   Oral Phase     Pharyngeal Phase Pharyngeal - Nectar Pharyngeal - Nectar Teaspoon: Delayed swallow initiation;Premature spillage to valleculae Pharyngeal - Nectar Cup: Delayed swallow initiation;Premature spillage to valleculae;Pharyngeal residue - valleculae;Reduced tongue base retraction Pharyngeal - Thin Pharyngeal - Thin Teaspoon: Pharyngeal residue - valleculae;Pharyngeal residue - pyriform sinuses;Reduced laryngeal elevation;Reduced tongue base retraction;Delayed swallow initiation;Premature spillage to pyriform sinuses Pharyngeal - Thin Cup: Delayed swallow initiation;Premature spillage to pyriform sinuses;Pharyngeal residue - valleculae;Pharyngeal residue - pyriform sinuses;Reduced tongue base retraction;Reduced laryngeal elevation;Penetration/Aspiration during swallow Penetration/Aspiration details (thin cup): Material enters airway, passes BELOW cords and not ejected out despite cough attempt by patient Pharyngeal - Solids Pharyngeal - Puree: Delayed swallow initiation;Premature spillage to valleculae  Cervical Esophageal Phase    GO              Orbie Pyo Colvin Caroli.Ed [employer] [phone]  02/03/2014

## 2014-02-03 NOTE — Progress Notes (Signed)
Physical Therapy Treatment [name] MRN: [id number] DOB: 05/01/1913 Today's Date: 02/03/2014 Time: 0263-7858 PT Time Calculation (min): 27 min  PT Assessment / Plan / Recommendation  History of Present Illness Sonya [name] is an 80 y.o. female who woke up yesterday morning 01/30/2014 and noted her speech was slurred. She did not seek medical attention at that time thinking it would clear. She went to sleep and woke up this AM 01/31/2014 with continued slurred speech. Due to her symptoms not resolving she came to ED. Her neighbor who is at bedside states her speech has improved but still is not back to baseline. The only other complaint she had was generalized weakness. She is on ASA 81 mg daily and states she has sever GI upset with Plavix.     PT Comments   Patient requiring significant increase in assist level today, +2 for multiple trials of standing and attempts at ambulation, poor ability to adjust to upright position despite various techniques to assist.   Follow Up Recommendations  SNF     Does the patient have the potential to tolerate intense rehabilitation     Barriers to Discharge        Equipment Recommendations  None recommended by PT    Recommendations for Other Services    Frequency Min 2X/week   Progress towards PT Goals Progress towards PT goals: Not progressing toward goals - comment  Plan Current plan remains appropriate    Precautions / Restrictions Precautions Precautions: Fall   Pertinent Vitals/Pain Soreness in LLE    Mobility  Bed Mobility Overal bed mobility: Needs Assistance Bed Mobility: Supine to Sit;Sit to Supine Supine to sit: Mod assist Sit to supine: Mod assist General bed mobility comments: Use of chuck pad to bring patient to EOB Transfers Overall transfer level: Needs assistance Equipment used: Rolling walker (2 wheeled) Transfers: Sit to/from Stand Sit to Stand: Max assist;+2 physical assistance General  transfer comment: VCs for hand placement, heavy reliance on assist to come to standing, some posterior lean, assist for stability Ambulation/Gait Ambulation/Gait assistance: Max assist;+2 physical assistance Ambulation Distance (Feet): 3 Feet Gait velocity: decreased significantly Modified Rankin (Stroke Patients Only) Pre-Morbid Rankin Score: Moderate disability Modified Rankin: Moderately severe disability    Exercises  Instructed for SLRs and abd/add    PT Goals (current goals can now be found in the care plan section) Acute Rehab PT Goals Patient Stated Goal: to be able to do things by herself PT Goal Formulation: With patient Time For Goal Achievement: 02/15/14 Potential to Achieve Goals: Fair  Visit Information  Last PT Received On: 02/03/14 Assistance Needed: +1 History of Present Illness: Sonya [name] is an 80 y.o. female who woke up yesterday morning 01/30/2014 and noted her speech was slurred. She did not seek medical attention at that time thinking it would clear. She went to sleep and woke up this AM 01/31/2014 with continued slurred speech. Due to her symptoms not resolving she came to ED. Her neighbor who is at bedside states her speech has improved but still is not back to baseline. The only other complaint she had was generalized weakness. She is on ASA 81 mg daily and states she has sever GI upset with Plavix.      Subjective Data  Subjective:  (im really weak) Patient Stated Goal: to be able to do things by herself   Cognition  Cognition Arousal/Alertness: Awake/alert Behavior During Therapy: Flat affect Overall Cognitive Status: Within Functional Limits for  tasks assessed    Balance  Balance Overall balance assessment: Needs assistance Sitting balance-Leahy Scale: Poor Standing balance-Leahy Scale: Poor  End of Session PT - End of Session Equipment Utilized During Treatment: Gait belt Activity Tolerance: Patient limited by fatigue Patient left: in  bed;with call bell/phone within reach;with family/visitor present Nurse Communication: Mobility status   GP     Duncan Dull 02/03/2014, 3:38 PM Alben Deeds, [place] DPT  [phone]

## 2014-02-03 NOTE — Progress Notes (Signed)
[facility] PROGRESS NOTE  MADILEE GRONAU GUR:[id number] DOB: 05-02-1913 DOA: 01/31/2014 PCP: Kimber Relic, MD  Assessment/Plan: #1 dysarthria/probable acute CVA Patient presented with dysarthria. MRI MRA with small bilateral acute lacunar infarcts in the basal ganglia/right corona radiata . 2-D echo pending. Carotid Doppler with no significant ICA stenosis. Fasting lipid panel with LDL of 129. Will start patient on crestor on discharge. pending. Continue aspirin for secondary stroke prevention. Neurology following and appreciate input and recommendations. PT/OT/ST.  #2 hypertension Continue Imdur to 60mg  daily and resume beta blocker.  Follow.  #3 chronic asthma Stable. Continue Advair.  #4 chronic diastolic heart failure/coronary artery disease Currently stable. Patient is currently euvolemic. 2-D echo with EF of 55-60% with no wall motion abnormalities. No source of emboli. Beta blocker resumed.   #5 dysphagia Patient is status post modified barium swallow. Currently on dysphagia 1 diet with nectar thick liquids. Will need outpatient speech followup.  #6 prophylaxis Protonix for GI prophylaxis, Lovenox for DVT prophylaxis.  Code Status: DO NOT RESUSCITATE Family Communication: Updated patient and her friend at bedside. Disposition Plan: SNF when medically stable   Consultants:  Neurology: Dr. Roseanne Reno 01/31/2014  Procedures:  MRI/MRA 02/01/2014  CT of 01/31/2014  Chest x-ray 01/31/2014  2-D echo 02/01/2014  MBS 02/03/14  Antibiotics:  None  HPI/Subjective: Patient states speech has not improved.   Objective: Filed Vitals:   02/03/14 1041  BP: 164/66  Pulse: 79  Temp: 98.1 F (36.7 C)  Resp: 18   No intake or output data in the 24 hours ending 02/03/14 1354 Filed Weights   01/31/14 1825  Weight: 66.225 kg (146 lb)    Exam:   General:  NAD  Cardiovascular: RRR  Respiratory: CTAB  Abdomen: Soft, nontender, nondistended, positive  bowel sounds.  Musculoskeletal: No clubbing cyanosis or edema  Data Reviewed: Basic Metabolic Panel:  Recent Labs Lab 01/31/14 1110 02/02/14 0540 02/03/14 0600  NA 141 138 141  K 4.5 4.2 5.0  CL 104 103 105  CO2 24 23 25   GLUCOSE 100* 122* 133*  BUN 22 25* 25*  CREATININE 0.71 0.77 0.79  CALCIUM 8.9 8.7 8.8   Liver Function Tests:  Recent Labs Lab 01/31/14 1110  AST 19  ALT 19  ALKPHOS 100  BILITOT 0.5  PROT 7.6  ALBUMIN 3.7   No results found for this basename: LIPASE, AMYLASE,  in the last 168 hours No results found for this basename: AMMONIA,  in the last 168 hours CBC:  Recent Labs Lab 01/31/14 1110  WBC 10.2  NEUTROABS 7.8*  HGB 14.1  HCT 42.4  MCV 91.0  PLT 356   Cardiac Enzymes:  Recent Labs Lab 01/31/14 1110  TROPONINI <0.30   BNP (last 3 results)  Recent Labs  01/31/14 2256  PROBNP 1230.0*   CBG:  Recent Labs Lab 01/31/14 1307  GLUCAP 95    Recent Results (from the past 240 hour(s))  URINE CULTURE     Status: None   Collection Time    02/02/14  1:30 PM      Result Value Ref Range Status   Specimen Description URINE, RANDOM   Final   Special Requests NONE   Final   Culture  Setup Time     Final   Value: 02/02/2014 14:48     Performed at [facility] Count     Final   Value: 40,000 COLONIES/ML     Performed at [facility]  Final   Value: Multiple bacterial morphotypes present, none predominant. Suggest appropriate recollection if clinically indicated.     Performed at [facility]   Report Status 02/03/2014 FINAL   Final     Studies: Dg Swallowing Func-speech Pathology  02/03/2014   Orbie Pyo [place], CCC-SLP     02/03/2014  1:10 PM Objective Swallowing Evaluation:   Modified Barium Swallow Patient Details  Name: Sonya [name] MRN: [id number] Date of Birth: [birth year]  Today's Date: 02/03/2014 Time:  -     Past Medical History:  Past Medical History  Diagnosis Date  . Acute  bronchitis   . Asthma   . TIA (transient ischemic attack)   . Herpes zoster complication 2585    complicated, left eye involvement  . Nontoxic uninodular goiter     negative [place] 2008  . Macular degeneration (senile) of retina, unspecified 2012    wet  . Acute myocardial infarction, unspecified site, episode of care  unspecified 03/07/11  . Coronary atherosclerosis of native coronary artery      s/p stent diagonal branch 1996; RCA 03/07/11  . Chronic diastolic heart failure   . Cardiomegaly   . Diaphragmatic hernia without mention of obstruction or gangrene    . Irritable bowel syndrome   . Other specified genital prolapse(618.89)     corrected with pessary  . Edema 2006  . Other abnormal blood chemistry   . Debility, unspecified   . Open wound of knee, leg (except thigh), and ankle, without  mention of complication 27/7824    left leg, complicated wound, extended healing, resolved 01/2013  . Transient ischemic attack (TIA), and cerebral infarction  without residual deficits(V12.54) 2008  . Shoulder pain 03/30/2013  . Hypertension   . Depression   . Finger numbness 12/28/2013    Left thumb, 1, 2nd fingers   Past Surgical History:  Past Surgical History  Procedure Laterality Date  . Appendectomy    . Tonsillectomy and adenoidectomy    . Cataract extraction    . Cardiac catheterization  1996, 2001, 2012  . Carotid stent      diagonal branch of LCA   HPI:  80 y.o. female, with a pmh significant for asthma, TIAs, chronic  diastolic heart failure, acute bronchitis, macular degeneration,  , MI IBS, and hypertension. On 2/9 she noticed she felt "swimmy  headed" but attributed it to missing her prozac dose (her refill  was not ready) so she did not mention it to anyone. Today she was  supposed to have lunch at [employer] in [place] with her good  friend. The friend noticed that Ms. Gullickson was not speaking  clearly and called the RN at [facility]. Ms. Colclasure lives in  independent living, walks with a walker and is active per her   report. She takes care of her ADLs.  In the emergency room her  labs appear normal, head CT is negative, aphasia has  significantly improved. CXR.  She reports that she occasionally  gets choked for no reason when she's eating or drinking.   Indications of oropharyngeal dysphagia observed by SLP, pt.  daughter and RN.  MBS recommended.      Assessment / Plan / Recommendation Clinical Impression  Clinical impression: Pt. demonstrated moderately decreased  ability to prep, masticate and transit a solid texture bolus  resulting in delay and residue.  Moderate sensorimotor  impairments led to pharyngeal swallow delay, mild residuals and  frank aspiration of thin barium followed by reflexive cough.  Although pt.'s mentation appears fairly functional, chin tuck was  not attempted at this time due to decreased endurance and  fatigue.  Recommend she continue with nectar thick liquids and  Dys 1 (puree) texture with continued ST for safety, education and  upgrade when able.      Treatment Recommendation  Therapy as outlined in treatment plan below    Diet Recommendation Dysphagia 1 (Puree);Nectar-thick liquid   Liquid Administration via: Cup;No straw Medication Administration: Whole meds with puree Supervision: Patient able to self feed;Full supervision/cueing  for compensatory strategies Compensations: Slow rate;Small sips/bites Postural Changes and/or Swallow Maneuvers: Seated upright 90  degrees    Other  Recommendations Oral Care Recommendations: Oral care BID   Follow Up Recommendations  Home health SLP    Frequency and Duration min 2x/week  2 weeks   Pertinent Vitals/Pain WDL          Reason for Referral Objectively evaluate swallowing function   Oral Phase     Pharyngeal Phase Pharyngeal - Nectar Pharyngeal - Nectar Teaspoon: Delayed swallow  initiation;Premature spillage to valleculae Pharyngeal - Nectar Cup: Delayed swallow initiation;Premature  spillage to valleculae;Pharyngeal residue - valleculae;Reduced  tongue  base retraction Pharyngeal - Thin Pharyngeal - Thin Teaspoon: Pharyngeal residue -  valleculae;Pharyngeal residue - pyriform sinuses;Reduced  laryngeal elevation;Reduced tongue base retraction;Delayed  swallow initiation;Premature spillage to pyriform sinuses Pharyngeal - Thin Cup: Delayed swallow initiation;Premature  spillage to pyriform sinuses;Pharyngeal residue -  valleculae;Pharyngeal residue - pyriform sinuses;Reduced tongue  base retraction;Reduced laryngeal  elevation;Penetration/Aspiration during swallow Penetration/Aspiration details (thin cup): Material enters  airway, passes BELOW cords and not ejected out despite cough  attempt by patient Pharyngeal - Solids Pharyngeal - Puree: Delayed swallow initiation;Premature spillage  to valleculae  Cervical Esophageal Phase    GO              Orbie Pyo Colvin Caroli.Ed CCC-SLP Pager [phone]  02/03/2014    Scheduled Meds: . aspirin  325 mg Oral Daily  . enoxaparin (LOVENOX) injection  30 mg Subcutaneous Q24H  . FLUoxetine  20 mg Oral Daily  . isosorbide mononitrate  60 mg Oral Daily  . metoprolol succinate  25 mg Oral Daily  . mometasone-formoterol  2 puff Inhalation BID  . pantoprazole  40 mg Oral Daily   Continuous Infusions:   Principal Problem:   CVA (cerebral vascular accident) vs TIA with aphasia Active Problems:   Hypertension   CAD (coronary artery disease)   Dysphasia   HTN (hypertension)   Chronic diastolic heart failure   Urinary retention    Time spent: 40 mins    [facility] MD [facility] Pager [phone]. If 7PM-7AM, please contact night-coverage at [email or web address], password [facility] 02/03/2014, 1:54 PM  LOS: 3 days

## 2014-02-04 DIAGNOSIS — R1310 Dysphagia, unspecified: Secondary | ICD-10-CM | POA: Diagnosis not present

## 2014-02-04 DIAGNOSIS — J45909 Unspecified asthma, uncomplicated: Secondary | ICD-10-CM | POA: Diagnosis not present

## 2014-02-04 DIAGNOSIS — I6350 Cerebral infarction due to unspecified occlusion or stenosis of unspecified cerebral artery: Secondary | ICD-10-CM | POA: Diagnosis not present

## 2014-02-04 DIAGNOSIS — I10 Essential (primary) hypertension: Secondary | ICD-10-CM | POA: Diagnosis not present

## 2014-02-04 LAB — BASIC METABOLIC PANEL
BUN: 25 mg/dL — ABNORMAL HIGH (ref 6–23)
CHLORIDE: 107 meq/L (ref 96–112)
CO2: 24 meq/L (ref 19–32)
Calcium: 9 mg/dL (ref 8.4–10.5)
Creatinine, Ser: 0.61 mg/dL (ref 0.50–1.10)
GFR calc Af Amer: 85 mL/min — ABNORMAL LOW (ref >90)
GFR, EST NON AFRICAN AMERICAN: 73 mL/min — AB (ref >90)
Glucose, Bld: 149 mg/dL — ABNORMAL HIGH (ref 70–99)
POTASSIUM: 4.2 meq/L (ref 3.7–5.3)
Sodium: 144 mEq/L (ref 137–147)

## 2014-02-04 NOTE — Progress Notes (Signed)
Report called to Hendricks Limes, RN on Rehab Unit at [facility]. [facility] called and awaiting to be transferred out.

## 2014-02-04 NOTE — Progress Notes (Signed)
Patient is medically stable for D/C to [facility] today. Clinical Education officer, museum (CSW) prepared D/C packet and arranged non-emergency EMS ([facility]) for transport. [facility] administrator reported that patient is going to room 144 and can return today. Patient's friend Sonya [name] is at bedside and aware of above. Nursing is aware of above. Please reconsult if further social work needs arise. CSW signing off.   Blima Rich, [place] CSW [phone]

## 2014-02-04 NOTE — Progress Notes (Signed)
[facility] PROGRESS NOTE  Sonya [name] IRS:[id number] DOB: April 20, 1913 DOA: 01/31/2014 PCP: Sonya Dooms, MD  Assessment/Plan: #1 dysarthria/probable acute CVA Patient presented with dysarthria. MRI MRA with small bilateral acute lacunar infarcts in the basal ganglia/right corona radiata . 2-D echo with EF 55-60%, NWMA, no source of emboli. Carotid Doppler with no significant ICA stenosis. Fasting lipid panel with LDL of 129. Will start patient on crestor on discharge. Continue aspirin for secondary stroke prevention. Neurology following and appreciate input and recommendations. PT/OT/ST.  #2 hypertension Continue Imdur and beta blocker.  Follow.  #3 chronic asthma Stable. Continue Advair.  #4 chronic diastolic heart failure/coronary artery disease Currently stable. Patient is currently euvolemic. 2-D echo with EF of 55-60% with no wall motion abnormalities. No source of emboli. Beta blocker resumed.   #5 dysphagia Patient is status post modified barium swallow. Currently on dysphagia 1 diet with nectar thick liquids. Will need outpatient speech followup.  #6 prophylaxis Protonix for GI prophylaxis, Lovenox for DVT prophylaxis.  Code Status: DO NOT RESUSCITATE Family Communication: Updated patient and her friend at bedside. Disposition Plan: SNF today  Hospital course as per d/c summary.   Consultants:  Neurology: Sonya [name] 01/31/2014  Procedures:  MRI/MRA 02/01/2014  CT of 01/31/2014  Chest x-ray 01/31/2014  2-D echo 02/01/2014  MBS 02/03/14  Antibiotics:  None  HPI/Subjective: Patient states speech has not improved.   Objective: Filed Vitals:   02/04/14 1013  BP: 189/64  Pulse: 75  Temp: 98.4 F (36.9 C)  Resp: 18    Intake/Output Summary (Last 24 hours) at 02/04/14 1120 Last data filed at 02/03/14 1300  Gross per 24 hour  Intake    200 ml  Output      0 ml  Net    200 ml   Filed Weights   01/31/14 1825  Weight: 66.225 kg (146  lb)    Exam:   General:  NAD. Dysarthria  Cardiovascular: RRR  Respiratory: CTAB  Abdomen: Soft, nontender, nondistended, positive bowel sounds.  Musculoskeletal: No clubbing cyanosis or edema  Data Reviewed: Basic Metabolic Panel:  Recent Labs Lab 01/31/14 1110 02/02/14 0540 02/03/14 0600 02/04/14 0830  NA 141 138 141 144  K 4.5 4.2 5.0 4.2  CL 104 103 105 107  CO2 24 23 25 24   GLUCOSE 100* 122* 133* 149*  BUN 22 25* 25* 25*  CREATININE 0.71 0.77 0.79 0.61  CALCIUM 8.9 8.7 8.8 9.0   Liver Function Tests:  Recent Labs Lab 01/31/14 1110  AST 19  ALT 19  ALKPHOS 100  BILITOT 0.5  PROT 7.6  ALBUMIN 3.7   No results found for this basename: LIPASE, AMYLASE,  in the last 168 hours No results found for this basename: AMMONIA,  in the last 168 hours CBC:  Recent Labs Lab 01/31/14 1110  WBC 10.2  NEUTROABS 7.8*  HGB 14.1  HCT 42.4  MCV 91.0  PLT 356   Cardiac Enzymes:  Recent Labs Lab 01/31/14 1110  TROPONINI <0.30   BNP (last 3 results)  Recent Labs  01/31/14 2256  PROBNP 1230.0*   CBG:  Recent Labs Lab 01/31/14 1307  GLUCAP 95    Recent Results (from the past 240 hour(s))  URINE CULTURE     Status: None   Collection Time    02/02/14  1:30 PM      Result Value Ref Range Status   Specimen Description URINE, RANDOM   Final   Special Requests NONE   Final  Culture  Setup Time     Final   Value: 02/02/2014 14:48     Performed at [place]     Final   Value: 40,000 COLONIES/ML     Performed at [facility]   Culture     Final   Value: Multiple bacterial morphotypes present, none predominant. Suggest appropriate recollection if clinically indicated.     Performed at [facility]   Report Status 02/03/2014 FINAL   Final     Studies: Dg Swallowing Func-speech Pathology  02/03/2014   Sonya [name], CCC-SLP     02/03/2014  1:10 PM Objective Swallowing Evaluation:   Modified Barium  Swallow Patient Details  Name: Sonya [name] MRN: [id number] Date of Birth: 25-Sep-1913  Today's Date: 02/03/2014 Time:  -     Past Medical History:  Past Medical History  Diagnosis Date  . Acute bronchitis   . Asthma   . TIA (transient ischemic attack)   . Herpes zoster complication 1027    complicated, left eye involvement  . Nontoxic uninodular goiter     negative [place] 2008  . Macular degeneration (senile) of retina, unspecified 2012    wet  . Acute myocardial infarction, unspecified site, episode of care  unspecified 03/07/11  . Coronary atherosclerosis of native coronary artery      s/p stent diagonal branch 1996; RCA 03/07/11  . Chronic diastolic heart failure   . Cardiomegaly   . Diaphragmatic hernia without mention of obstruction or gangrene    . Irritable bowel syndrome   . Other specified genital prolapse(618.89)     corrected with pessary  . Edema 2006  . Other abnormal blood chemistry   . Debility, unspecified   . Open wound of knee, leg (except thigh), and ankle, without  mention of complication 25/3664    left leg, complicated wound, extended healing, resolved 01/2013  . Transient ischemic attack (TIA), and cerebral infarction  without residual deficits(V12.54) 2008  . Shoulder pain 03/30/2013  . Hypertension   . Depression   . Finger numbness 12/28/2013    Left thumb, 1, 2nd fingers   Past Surgical History:  Past Surgical History  Procedure Laterality Date  . Appendectomy    . Tonsillectomy and adenoidectomy    . Cataract extraction    . Cardiac catheterization  1996, 2001, 2012  . Carotid stent      diagonal branch of LCA   HPI:  80 y.o. female, with a pmh significant for asthma, TIAs, chronic  diastolic heart failure, acute bronchitis, macular degeneration,  , MI IBS, and hypertension. On 2/9 she noticed she felt "swimmy  headed" but attributed it to missing her prozac dose (her refill  was not ready) so she did not mention it to anyone. Today she was  supposed to have lunch at [employer] in [place] with  her good  friend. The friend noticed that Sonya [name] was not speaking  clearly and called the RN at [employer]. Sonya [name] lives in  independent living, walks with a walker and is active per her  report. She takes care of her ADLs.  In the emergency room her  labs appear normal, head CT is negative, aphasia has  significantly improved. CXR.  She reports that she occasionally  gets choked for no reason when she's eating or drinking.   Indications of oropharyngeal dysphagia observed by SLP, pt.  daughter and RN.  MBS recommended.      Assessment /  Plan / Recommendation Clinical Impression  Clinical impression: Pt. demonstrated moderately decreased  ability to prep, masticate and transit a solid texture bolus  resulting in delay and residue.  Moderate sensorimotor  impairments led to pharyngeal swallow delay, mild residuals and  frank aspiration of thin barium followed by reflexive cough.   Although pt.'s mentation appears fairly functional, chin tuck was  not attempted at this time due to decreased endurance and  fatigue.  Recommend she continue with nectar thick liquids and  Dys 1 (puree) texture with continued ST for safety, education and  upgrade when able.      Treatment Recommendation  Therapy as outlined in treatment plan below    Diet Recommendation Dysphagia 1 (Puree);Nectar-thick liquid   Liquid Administration via: Cup;No straw Medication Administration: Whole meds with puree Supervision: Patient able to self feed;Full supervision/cueing  for compensatory strategies Compensations: Slow rate;Small sips/bites Postural Changes and/or Swallow Maneuvers: Seated upright 90  degrees    Other  Recommendations Oral Care Recommendations: Oral care BID   Follow Up Recommendations  Home health SLP    Frequency and Duration min 2x/week  2 weeks   Pertinent Vitals/Pain WDL          Reason for Referral Objectively evaluate swallowing function   Oral Phase     Pharyngeal Phase Pharyngeal - Nectar Pharyngeal - Nectar Teaspoon:  Delayed swallow  initiation;Premature spillage to valleculae Pharyngeal - Nectar Cup: Delayed swallow initiation;Premature  spillage to valleculae;Pharyngeal residue - valleculae;Reduced  tongue base retraction Pharyngeal - Thin Pharyngeal - Thin Teaspoon: Pharyngeal residue -  valleculae;Pharyngeal residue - pyriform sinuses;Reduced  laryngeal elevation;Reduced tongue base retraction;Delayed  swallow initiation;Premature spillage to pyriform sinuses Pharyngeal - Thin Cup: Delayed swallow initiation;Premature  spillage to pyriform sinuses;Pharyngeal residue -  valleculae;Pharyngeal residue - pyriform sinuses;Reduced tongue  base retraction;Reduced laryngeal  elevation;Penetration/Aspiration during swallow Penetration/Aspiration details (thin cup): Material enters  airway, passes BELOW cords and not ejected out despite cough  attempt by patient Pharyngeal - Solids Pharyngeal - Puree: Delayed swallow initiation;Premature spillage  to valleculae  Cervical Esophageal Phase    GO              Sonya Pyo Colvin Caroli.Ed CCC-SLP Pager [phone]  02/03/2014    Scheduled Meds: . aspirin  325 mg Oral Daily  . enoxaparin (LOVENOX) injection  30 mg Subcutaneous Q24H  . FLUoxetine  20 mg Oral Daily  . isosorbide mononitrate  60 mg Oral Daily  . metoprolol succinate  25 mg Oral Daily  . mometasone-formoterol  2 puff Inhalation BID  . pantoprazole  40 mg Oral Daily   Continuous Infusions:   Principal Problem:   CVA (cerebral vascular accident) Active Problems:   Hypertension   CAD (coronary artery disease)   Dysphasia   HTN (hypertension)   Chronic diastolic heart failure   Urinary retention    Time spent: 79 mins    [facility] MD [facility] Pager [phone]. If 7PM-7AM, please contact night-coverage at [email or web address], password [facility] 02/04/2014, 11:20 AM  LOS: 4 days

## 2014-02-06 DIAGNOSIS — M6281 Muscle weakness (generalized): Secondary | ICD-10-CM | POA: Diagnosis not present

## 2014-02-06 DIAGNOSIS — I69959 Hemiplegia and hemiparesis following unspecified cerebrovascular disease affecting unspecified side: Secondary | ICD-10-CM | POA: Diagnosis not present

## 2014-02-06 DIAGNOSIS — I69991 Dysphagia following unspecified cerebrovascular disease: Secondary | ICD-10-CM | POA: Diagnosis not present

## 2014-02-06 DIAGNOSIS — R269 Unspecified abnormalities of gait and mobility: Secondary | ICD-10-CM | POA: Diagnosis not present

## 2014-02-06 DIAGNOSIS — R1312 Dysphagia, oropharyngeal phase: Secondary | ICD-10-CM | POA: Diagnosis not present

## 2014-02-06 DIAGNOSIS — I69922 Dysarthria following unspecified cerebrovascular disease: Secondary | ICD-10-CM | POA: Diagnosis not present

## 2014-02-06 DIAGNOSIS — R279 Unspecified lack of coordination: Secondary | ICD-10-CM | POA: Diagnosis not present

## 2014-02-06 DIAGNOSIS — I6789 Other cerebrovascular disease: Secondary | ICD-10-CM | POA: Diagnosis not present

## 2014-02-06 DIAGNOSIS — R41842 Visuospatial deficit: Secondary | ICD-10-CM | POA: Diagnosis not present

## 2014-02-07 DIAGNOSIS — I69922 Dysarthria following unspecified cerebrovascular disease: Secondary | ICD-10-CM | POA: Diagnosis not present

## 2014-02-07 DIAGNOSIS — I69959 Hemiplegia and hemiparesis following unspecified cerebrovascular disease affecting unspecified side: Secondary | ICD-10-CM | POA: Diagnosis not present

## 2014-02-07 DIAGNOSIS — R41842 Visuospatial deficit: Secondary | ICD-10-CM | POA: Diagnosis not present

## 2014-02-07 DIAGNOSIS — I69991 Dysphagia following unspecified cerebrovascular disease: Secondary | ICD-10-CM | POA: Diagnosis not present

## 2014-02-07 DIAGNOSIS — R279 Unspecified lack of coordination: Secondary | ICD-10-CM | POA: Diagnosis not present

## 2014-02-07 DIAGNOSIS — I6789 Other cerebrovascular disease: Secondary | ICD-10-CM | POA: Diagnosis not present

## 2014-02-08 DIAGNOSIS — R41842 Visuospatial deficit: Secondary | ICD-10-CM | POA: Diagnosis not present

## 2014-02-08 DIAGNOSIS — I69959 Hemiplegia and hemiparesis following unspecified cerebrovascular disease affecting unspecified side: Secondary | ICD-10-CM | POA: Diagnosis not present

## 2014-02-08 DIAGNOSIS — I69922 Dysarthria following unspecified cerebrovascular disease: Secondary | ICD-10-CM | POA: Diagnosis not present

## 2014-02-08 DIAGNOSIS — I6789 Other cerebrovascular disease: Secondary | ICD-10-CM | POA: Diagnosis not present

## 2014-02-08 DIAGNOSIS — I69991 Dysphagia following unspecified cerebrovascular disease: Secondary | ICD-10-CM | POA: Diagnosis not present

## 2014-02-08 DIAGNOSIS — R279 Unspecified lack of coordination: Secondary | ICD-10-CM | POA: Diagnosis not present

## 2014-02-09 DIAGNOSIS — R279 Unspecified lack of coordination: Secondary | ICD-10-CM | POA: Diagnosis not present

## 2014-02-09 DIAGNOSIS — I69991 Dysphagia following unspecified cerebrovascular disease: Secondary | ICD-10-CM | POA: Diagnosis not present

## 2014-02-09 DIAGNOSIS — I69922 Dysarthria following unspecified cerebrovascular disease: Secondary | ICD-10-CM | POA: Diagnosis not present

## 2014-02-09 DIAGNOSIS — I6789 Other cerebrovascular disease: Secondary | ICD-10-CM | POA: Diagnosis not present

## 2014-02-09 DIAGNOSIS — R41842 Visuospatial deficit: Secondary | ICD-10-CM | POA: Diagnosis not present

## 2014-02-09 DIAGNOSIS — I69959 Hemiplegia and hemiparesis following unspecified cerebrovascular disease affecting unspecified side: Secondary | ICD-10-CM | POA: Diagnosis not present

## 2014-02-10 DIAGNOSIS — I6789 Other cerebrovascular disease: Secondary | ICD-10-CM | POA: Diagnosis not present

## 2014-02-10 DIAGNOSIS — R279 Unspecified lack of coordination: Secondary | ICD-10-CM | POA: Diagnosis not present

## 2014-02-10 DIAGNOSIS — R41842 Visuospatial deficit: Secondary | ICD-10-CM | POA: Diagnosis not present

## 2014-02-10 DIAGNOSIS — I69991 Dysphagia following unspecified cerebrovascular disease: Secondary | ICD-10-CM | POA: Diagnosis not present

## 2014-02-10 DIAGNOSIS — I69959 Hemiplegia and hemiparesis following unspecified cerebrovascular disease affecting unspecified side: Secondary | ICD-10-CM | POA: Diagnosis not present

## 2014-02-10 DIAGNOSIS — I69922 Dysarthria following unspecified cerebrovascular disease: Secondary | ICD-10-CM | POA: Diagnosis not present

## 2014-02-11 DIAGNOSIS — R41842 Visuospatial deficit: Secondary | ICD-10-CM | POA: Diagnosis not present

## 2014-02-11 DIAGNOSIS — I69922 Dysarthria following unspecified cerebrovascular disease: Secondary | ICD-10-CM | POA: Diagnosis not present

## 2014-02-11 DIAGNOSIS — I69959 Hemiplegia and hemiparesis following unspecified cerebrovascular disease affecting unspecified side: Secondary | ICD-10-CM | POA: Diagnosis not present

## 2014-02-11 DIAGNOSIS — I69991 Dysphagia following unspecified cerebrovascular disease: Secondary | ICD-10-CM | POA: Diagnosis not present

## 2014-02-11 DIAGNOSIS — R279 Unspecified lack of coordination: Secondary | ICD-10-CM | POA: Diagnosis not present

## 2014-02-11 DIAGNOSIS — I6789 Other cerebrovascular disease: Secondary | ICD-10-CM | POA: Diagnosis not present

## 2014-02-13 DIAGNOSIS — R279 Unspecified lack of coordination: Secondary | ICD-10-CM | POA: Diagnosis not present

## 2014-02-13 DIAGNOSIS — I6789 Other cerebrovascular disease: Secondary | ICD-10-CM | POA: Diagnosis not present

## 2014-02-13 DIAGNOSIS — R41842 Visuospatial deficit: Secondary | ICD-10-CM | POA: Diagnosis not present

## 2014-02-13 DIAGNOSIS — I69922 Dysarthria following unspecified cerebrovascular disease: Secondary | ICD-10-CM | POA: Diagnosis not present

## 2014-02-13 DIAGNOSIS — I69959 Hemiplegia and hemiparesis following unspecified cerebrovascular disease affecting unspecified side: Secondary | ICD-10-CM | POA: Diagnosis not present

## 2014-02-13 DIAGNOSIS — I69991 Dysphagia following unspecified cerebrovascular disease: Secondary | ICD-10-CM | POA: Diagnosis not present

## 2014-02-14 ENCOUNTER — Encounter: Payer: Self-pay | Admitting: Geriatric Medicine

## 2014-02-14 ENCOUNTER — Non-Acute Institutional Stay (SKILLED_NURSING_FACILITY): Payer: Medicare Other | Admitting: Geriatric Medicine

## 2014-02-14 DIAGNOSIS — I69391 Dysphagia following cerebral infarction: Secondary | ICD-10-CM | POA: Insufficient documentation

## 2014-02-14 DIAGNOSIS — I69959 Hemiplegia and hemiparesis following unspecified cerebrovascular disease affecting unspecified side: Secondary | ICD-10-CM | POA: Diagnosis not present

## 2014-02-14 DIAGNOSIS — I6350 Cerebral infarction due to unspecified occlusion or stenosis of unspecified cerebral artery: Secondary | ICD-10-CM

## 2014-02-14 DIAGNOSIS — R279 Unspecified lack of coordination: Secondary | ICD-10-CM | POA: Diagnosis not present

## 2014-02-14 DIAGNOSIS — I69991 Dysphagia following unspecified cerebrovascular disease: Secondary | ICD-10-CM | POA: Diagnosis not present

## 2014-02-14 DIAGNOSIS — I639 Cerebral infarction, unspecified: Secondary | ICD-10-CM

## 2014-02-14 DIAGNOSIS — I10 Essential (primary) hypertension: Secondary | ICD-10-CM | POA: Diagnosis not present

## 2014-02-14 DIAGNOSIS — I6789 Other cerebrovascular disease: Secondary | ICD-10-CM | POA: Diagnosis not present

## 2014-02-14 DIAGNOSIS — R41842 Visuospatial deficit: Secondary | ICD-10-CM | POA: Diagnosis not present

## 2014-02-14 DIAGNOSIS — I69922 Dysarthria following unspecified cerebrovascular disease: Secondary | ICD-10-CM | POA: Diagnosis not present

## 2014-02-14 HISTORY — DX: Dysphagia following cerebral infarction: I69.391

## 2014-02-14 NOTE — Assessment & Plan Note (Addendum)
Left sided weakness, dysarthria, dysphagia. Participating in PT/OT/ST, limited progress so far. Continue interventions to maximize functional status. Continue ASA, d/c rosuvastatin (pt. Has been refusing)

## 2014-02-14 NOTE — Progress Notes (Signed)
Patient ID: Sonya [name], female   DOB: 1913/11/16, 80 y.o.   MRN: [id number]   [facility] [phone])  Code Status: DNR   Contact Information   Name Relation Home Work Mobile   [place] Friend [phone]     Ginger Blue [phone]     Scot Dock [phone]   Roanna Epley Sister [phone]        Chief Complaint  Patient presents with  . Cerebrovascular Accident  . Dysphagia    HPI: This is a 80 y.o. female resident of [place], Independent Living  section.  On 01/31/2014 patient was admitted to hospital dysarthria attributed to TIA. She was to go out to have lunch with a friend who noticed patient wasn't speaking clearly. ED evaluation revealed normal-appearing labs, negative head CT, improved speech.  Patient was admitted for further workup and neurology consultation. Head MRI/MRA showed small bilateral acute lacunar infarcts in the basal ganglia and right corona radiata.  Patient did exhibit some difficulty with swallowing and further difficulty with speech. Modified barium swallow study was completed, showed moderate oropharyngeal dysphagia, therapist recommended pure diet, nectar thick liquids and further therapy by SLP at discharge.  2-D echocardiogram was completed with no source of emboli identified carotid Doppler study did not show significant ICA stenosis.  The patient was placed on aspirin 325 mg daily for stroke risk reduction (she had previous adverse effects from Plavix). Low-dose statin was also started due to LDL 129. Patient's antihypertensive medications were initially held to allow for permissive hypertension and cerebrovascular effusion. These medications were slowly resumed prior to discharge. Patient was medically ready for discharge to [facility]illed rehabilitation section of [facility] 02/04/2014. Since arrival at [facility], patient has participated with physical,  occupational and speech therapies. Therapists report she's had minimal progress; continues to require skilled level of care. Review of facility record shows patient's vital signs have been stable, p.o. intake has been only fair. Patient has been refusing doses of Crestor due to her previous adversec reaction to statin medication.     Allergies  Allergen Reactions  . Plavix [Clopidogrel] Nausea And Vomiting  . Statins Other (See Comments)    Leg cramps       Medication List       This list is accurate as of: 02/14/14  5:29 PM.  Always use your most recent med list.               ADVAIR DISKUS 250-50 MCG/DOSE Aepb  Generic drug:  Fluticasone-Salmeterol  Inhale 1 puff into the lungs every 12 (twelve) hours.     antiseptic oral rinse Liqd  15 mLs by Mouth Rinse route 4 (four) times daily.     aspirin 325 MG tablet  Take 1 tablet (325 mg total) by mouth daily.     FLUoxetine 20 MG capsule  Commonly known as:  PROZAC  Take 20 mg by mouth daily.     ICAPS PO  Take 1 tablet by mouth daily.     isosorbide mononitrate 60 MG 24 hr tablet  Commonly known as:  IMDUR  Take 60 mg by mouth at bedtime.     loteprednol 0.5 % ophthalmic suspension  Commonly known as:  LOTEMAX  Place 1 drop into the left eye. One drop in left eye 1-2 times daily as needed     metoprolol succinate 25 MG 24 hr tablet  Commonly known as:  TOPROL-XL  Take 25 mg by mouth daily.  NEXIUM 40 MG capsule  Generic drug:  esomeprazole  Take 1 capsule by mouth every evening.     nitroGLYCERIN 0.4 MG SL tablet  Commonly known as:  NITROSTAT  Place 0.4 mg under the tongue every 5 (five) minutes as needed for chest pain.     RESOURCE THICKENUP CLEAR Powd  Take 120 g by mouth as needed.       DATA REVIEWED  Radiologic Exams  01/31/2013   CT HEAD WITHOUT CONTRAST  COMPARISON:  09/14/2007 IMPRESSION: No definite acute cortical infarction. Periventricular and patchy subcortical white matter  decreased attenuation probable due to chronic small vessel ischemic changes. No mass lesion is noted on this unenhanced scan. No intracranial hemorrhage, mass effect or midline shift.   CHEST  2 VIEW  COMPARISON:  Chest x-ray on 09/28/2012 IMPRESSION: 1. Cardiomegaly without pulmonary edema. 2. Bronchitic changes.     02/01/2014  MRI HEAD WITHOUT CONTRAST/MRA HEAD WITHOUT CONTRAST COMPARISON:  Head CT without contrast 01/31/2014. Brain MRI and MRA 09/14/2007. IMPRESSION: 1. Small bilateral acute lacunar infarcts in the basal ganglia/right corona radiata. Favor synchronous small vessel ischemia. No associated mass effect or hemorrhage. 2. Advanced chronic small vessel disease, otherwise not significantly progressed since 2008. 3. Stable and negative for age intracranial MRA.    Cardiovascular Exams 02/01/2014  Carotid Duplex Study  - The vertebral arteries appear patent with antegrade flow. - Findings consistent with 1-39 percent stenosis involving   the right internal carotid artery and the left internal   carotid artery.  Transthoracic Echocardiography Study Conclusions - Left ventricle: The cavity size was normal. There was   moderate concentric hypertrophy. Systolic function was   normal. The estimated ejection fraction was in the range   of 55% to 60%. Wall motion was normal; there were no   regional wall motion abnormalities. Doppler parameters are   consistent with abnormal left ventricular relaxation   (grade 1 diastolic dysfunction). Doppler parameters are   consistent with elevated ventricular end-diastolic filling   pressure. - Aortic valve: Transvalvular velocity was within the normal   range. There was no stenosis. No regurgitation. - Aortic root: The aortic root was normal in size. - Mitral valve: Calcified annulus. Mildly thickened leaflets   . Mild regurgitation. - Right ventricle: Systolic function was normal. - Tricuspid valve: Trivial  regurgitation. - Pulmonic valve: No regurgitation. - Pulmonary arteries: Systolic pressure was within the   normal range.   Laboratory Studies: Lab Results  Component Value Date   TSH 1.12 09/22/2013    Lab Results  Component Value Date   WBC 10.2 01/31/2014   HGB 14.1 01/31/2014   HCT 42.4 01/31/2014   MCV 91.0 01/31/2014   PLT 356 01/31/2014    Lab Results  Component Value Date   NA 144 02/04/2014   K 4.2 02/04/2014   GLU 149    BUN 25* 02/04/2014   CREATININE 0.61 02/04/2014   Lab Results  Component Value Date   CALCIUM 9.0 02/04/2014   ALBUMIN 3.7 01/31/2014   AST 19 01/31/2014   ALT 19 01/31/2014   ALKPHOS 100 01/31/2014   BILITOT 0.5 01/31/2014   GFRNONAA 73* 02/04/2014   GFRAA 85* 02/04/2014   albumin 3.7 01/31/2014   Lab Results  Component Value Date   CHOL 235* 02/01/2014   CHOL 180 09/22/2013   CHOL  Value: 192        ATP III CLASSIFICATION:  <200     mg/dL   Desirable  200-239  mg/dL  Borderline High  >=240    mg/dL   High        02/20/2011   Lab Results  Component Value Date   HDL 69 02/01/2014   HDL 55 09/22/2013   HDL 61 02/20/2011   Lab Results  Component Value Date   LDLCALC 129* 02/01/2014   LDLCALC 92 09/22/2013   LDLCALC  Value: 109        Total Cholesterol/HDL:CHD Risk Coronary Heart Disease Risk Table                     Men   Women  1/2 Average Risk   3.4   3.3  Average Risk       5.0   4.4  2 X Average Risk   9.6   7.1  3 X Average Risk  23.4   11.0        Use the calculated Patient Ratio above and the CHD Risk Table to determine the patient's CHD Risk.        ATP III CLASSIFICATION (LDL):  <100     mg/dL   Optimal  100-129  mg/dL   Near or Above                    Optimal  130-159  mg/dL   Borderline  160-189  mg/dL   High  >190     mg/dL   Very High* 02/20/2011   Lab Results  Component Value Date   TRIG 185* 02/01/2014   TRIG 167* 09/22/2013   TRIG 109 02/20/2011   Lab Results  Component Value Date   CHOLHDL 3.4 02/01/2014   CHOLHDL 3.1 02/20/2011   No  results found for this basename: LDLDIRECT    No results found for this basename: vitaminb12, vitamind     REVIEW OF SYSTEMS  DATA OBTAINED: from patient, nurse, medical record GENERAL: "I feel okay, tired after therapies today". No fevers, decreased appetite.  SKIN: No itch, rash or open wounds EYES: No eye pain, dryness or itching,  chronic very poor vision EARS: No earache, tinnitus, change in hearing NOSE: No congestion, drainage or bleeding MOUTH/THROAT: No mouth or tooth pain  No sore throat    RESPIRATORY: No cough, wheezing, SOB CARDIAC: No chest pain, palpitations  No edema. GI: No abdominal pain  No nausea, vomiting,diarrhea or constipation  No heartburn or reflux  . Pureed Diet with nectar thick liquids GU: No dysuria, frequency or urgency  No change in urine volume or character   MUSCULOSKELETAL: No joint pain, swelling or stiffness  No back pain  No muscle ache, pain, weakness  Gait is unsteady  No recent falls.  NEUROLOGIC: No dizziness, fainting, headache  No change in mental status.  PSYCHIATRIC: No feelings of anxiety, depression  Sleeps well.  No behavior issue.    PHYSICAL EXAM Filed Vitals:   02/14/14 1644  BP: 115/60  Pulse: 77  Temp: 97.9 F (36.6 C)  Resp: 21  Weight: 132 lb 9.6 oz (60.147 kg)  SpO2: 96%   Body mass index is 25.9 kg/(m^2).  GENERAL APPEARANCE: No acute distress, appropriately groomed, normal body habitus. Alert, pleasant, conversant, speech is slightly dysarthric. SKIN: No diaphoresis, rash,  HEAD: Normocephalic, atraumatic EYES: RT.Conjunctiva clear, left eye is cloudy, chronic. Bilateral lids are clear. RT.Pupil round, reactive. Patient is nearly blind EARS: External exam WNL, Hearing grossly normal. NOSE: No deformity or discharge. MOUTH/THROAT: Lips w/o lesions. Oral mucosa, tongue moist, w/o lesion. Oropharynx  w/o redness or lesions.  NECK: Supple, full ROM. No thyroid tenderness, enlargement or nodule LYMPHATICS: No head, neck  or supraclavicular adenopathy RESPIRATORY: Breathing is even, unlabored. Lungs with few crackles at bases  CARDIOVASCULAR: Heart RRR. No murmur or extra heart sounds  ARTERIAL: No carotid bruit.   VENOUS: No varicosities. No venous stasis skin changes  EDEMA: No peripheral edema. GASTROINTESTINAL: Abdomen is soft, non-tender, not distended w/ normal bowel sounds. No hepatic or splenic enlargement. No mass, ventral or inguinal hernia. MUSCULOSKELETAL: Left arm weakness evident, left leg strength adequate. Back with moderate kyphosis, nontender NEUROLOGIC: Oriented to time, place, person. Left facial facial droop evident, speech is mildly dysarthric PSYCHIATRIC: Mood and affect appropriate to situation   ASSESSMENT/PLAN  CVA (cerebral vascular accident) Left sided weakness, dysarthria, dysphagia. Participating in PT/OT/ST, limited progress so far. Continue interventions to maximize functional status. Continue ASA, d/c rosuvastatin (pt. Has been refusing)  Dysphagia S/P CVA (cerebrovascular accident) Unable to manage thin liquids, doesn't like thickened liquids. Continue current restrictions for now to reduce aspiration risk. Continue ST interventions to maximize functional status. Recheck lab  Hypertension BP stable with current medication    Family/ staff Communication:     Labs/tests ordered: CBC, CMP 02/16/14   Follow up: As needed  Mardene Celeste, NP-C [place] 336 [phone]  02/14/2014

## 2014-02-14 NOTE — Assessment & Plan Note (Signed)
BP stable with current medication

## 2014-02-14 NOTE — Assessment & Plan Note (Signed)
Unable to manage thin liquids, doesn't like thickened liquids. Continue current restrictions for now to reduce aspiration risk. Continue ST interventions to maximize functional status. [place] lab

## 2014-02-15 DIAGNOSIS — I69959 Hemiplegia and hemiparesis following unspecified cerebrovascular disease affecting unspecified side: Secondary | ICD-10-CM | POA: Diagnosis not present

## 2014-02-15 DIAGNOSIS — R279 Unspecified lack of coordination: Secondary | ICD-10-CM | POA: Diagnosis not present

## 2014-02-15 DIAGNOSIS — I6789 Other cerebrovascular disease: Secondary | ICD-10-CM | POA: Diagnosis not present

## 2014-02-15 DIAGNOSIS — I69991 Dysphagia following unspecified cerebrovascular disease: Secondary | ICD-10-CM | POA: Diagnosis not present

## 2014-02-15 DIAGNOSIS — I69922 Dysarthria following unspecified cerebrovascular disease: Secondary | ICD-10-CM | POA: Diagnosis not present

## 2014-02-15 DIAGNOSIS — R41842 Visuospatial deficit: Secondary | ICD-10-CM | POA: Diagnosis not present

## 2014-02-16 DIAGNOSIS — I6789 Other cerebrovascular disease: Secondary | ICD-10-CM | POA: Diagnosis not present

## 2014-02-16 DIAGNOSIS — R279 Unspecified lack of coordination: Secondary | ICD-10-CM | POA: Diagnosis not present

## 2014-02-16 DIAGNOSIS — I69922 Dysarthria following unspecified cerebrovascular disease: Secondary | ICD-10-CM | POA: Diagnosis not present

## 2014-02-16 DIAGNOSIS — I69959 Hemiplegia and hemiparesis following unspecified cerebrovascular disease affecting unspecified side: Secondary | ICD-10-CM | POA: Diagnosis not present

## 2014-02-16 DIAGNOSIS — I69991 Dysphagia following unspecified cerebrovascular disease: Secondary | ICD-10-CM | POA: Diagnosis not present

## 2014-02-16 DIAGNOSIS — R41842 Visuospatial deficit: Secondary | ICD-10-CM | POA: Diagnosis not present

## 2014-02-16 LAB — BASIC METABOLIC PANEL
BUN: 14 mg/dL (ref 4–21)
Creatinine: 0.8 mg/dL (ref 0.5–1.1)
Glucose: 101 mg/dL
Potassium: 4.5 mmol/L (ref 3.4–5.3)
SODIUM: 139 mmol/L (ref 137–147)

## 2014-02-16 LAB — CBC AND DIFFERENTIAL
HEMATOCRIT: 38 % (ref 36–46)
HEMOGLOBIN: 12.8 g/dL (ref 12.0–16.0)
Platelets: 334 10*3/uL (ref 150–399)
WBC: 8.6 10^3/mL

## 2014-02-16 LAB — HEPATIC FUNCTION PANEL
ALK PHOS: 84 U/L (ref 25–125)
ALT: 15 U/L (ref 7–35)
AST: 17 U/L (ref 13–35)

## 2014-02-17 DIAGNOSIS — I69959 Hemiplegia and hemiparesis following unspecified cerebrovascular disease affecting unspecified side: Secondary | ICD-10-CM | POA: Diagnosis not present

## 2014-02-17 DIAGNOSIS — I2109 ST elevation (STEMI) myocardial infarction involving other coronary artery of anterior wall: Secondary | ICD-10-CM | POA: Diagnosis not present

## 2014-02-17 DIAGNOSIS — R7989 Other specified abnormal findings of blood chemistry: Secondary | ICD-10-CM | POA: Diagnosis not present

## 2014-02-17 DIAGNOSIS — I69991 Dysphagia following unspecified cerebrovascular disease: Secondary | ICD-10-CM | POA: Diagnosis not present

## 2014-02-17 DIAGNOSIS — I6789 Other cerebrovascular disease: Secondary | ICD-10-CM | POA: Diagnosis not present

## 2014-02-17 DIAGNOSIS — I69922 Dysarthria following unspecified cerebrovascular disease: Secondary | ICD-10-CM | POA: Diagnosis not present

## 2014-02-17 DIAGNOSIS — R41842 Visuospatial deficit: Secondary | ICD-10-CM | POA: Diagnosis not present

## 2014-02-17 DIAGNOSIS — R279 Unspecified lack of coordination: Secondary | ICD-10-CM | POA: Diagnosis not present

## 2014-02-17 DIAGNOSIS — R1310 Dysphagia, unspecified: Secondary | ICD-10-CM | POA: Diagnosis not present

## 2014-02-18 DIAGNOSIS — R279 Unspecified lack of coordination: Secondary | ICD-10-CM | POA: Diagnosis not present

## 2014-02-18 DIAGNOSIS — I69922 Dysarthria following unspecified cerebrovascular disease: Secondary | ICD-10-CM | POA: Diagnosis not present

## 2014-02-18 DIAGNOSIS — I69959 Hemiplegia and hemiparesis following unspecified cerebrovascular disease affecting unspecified side: Secondary | ICD-10-CM | POA: Diagnosis not present

## 2014-02-18 DIAGNOSIS — I6789 Other cerebrovascular disease: Secondary | ICD-10-CM | POA: Diagnosis not present

## 2014-02-18 DIAGNOSIS — I69991 Dysphagia following unspecified cerebrovascular disease: Secondary | ICD-10-CM | POA: Diagnosis not present

## 2014-02-18 DIAGNOSIS — R41842 Visuospatial deficit: Secondary | ICD-10-CM | POA: Diagnosis not present

## 2014-02-19 DIAGNOSIS — I69922 Dysarthria following unspecified cerebrovascular disease: Secondary | ICD-10-CM | POA: Diagnosis not present

## 2014-02-19 DIAGNOSIS — I69959 Hemiplegia and hemiparesis following unspecified cerebrovascular disease affecting unspecified side: Secondary | ICD-10-CM | POA: Diagnosis not present

## 2014-02-19 DIAGNOSIS — M6281 Muscle weakness (generalized): Secondary | ICD-10-CM | POA: Diagnosis not present

## 2014-02-19 DIAGNOSIS — I6789 Other cerebrovascular disease: Secondary | ICD-10-CM | POA: Diagnosis not present

## 2014-02-19 DIAGNOSIS — R1312 Dysphagia, oropharyngeal phase: Secondary | ICD-10-CM | POA: Diagnosis not present

## 2014-02-19 DIAGNOSIS — R269 Unspecified abnormalities of gait and mobility: Secondary | ICD-10-CM | POA: Diagnosis not present

## 2014-02-19 DIAGNOSIS — R279 Unspecified lack of coordination: Secondary | ICD-10-CM | POA: Diagnosis not present

## 2014-02-19 DIAGNOSIS — R41842 Visuospatial deficit: Secondary | ICD-10-CM | POA: Diagnosis not present

## 2014-02-19 DIAGNOSIS — I69991 Dysphagia following unspecified cerebrovascular disease: Secondary | ICD-10-CM | POA: Diagnosis not present

## 2014-02-20 DIAGNOSIS — R279 Unspecified lack of coordination: Secondary | ICD-10-CM | POA: Diagnosis not present

## 2014-02-20 DIAGNOSIS — R41842 Visuospatial deficit: Secondary | ICD-10-CM | POA: Diagnosis not present

## 2014-02-20 DIAGNOSIS — I69922 Dysarthria following unspecified cerebrovascular disease: Secondary | ICD-10-CM | POA: Diagnosis not present

## 2014-02-20 DIAGNOSIS — I6789 Other cerebrovascular disease: Secondary | ICD-10-CM | POA: Diagnosis not present

## 2014-02-20 DIAGNOSIS — I69959 Hemiplegia and hemiparesis following unspecified cerebrovascular disease affecting unspecified side: Secondary | ICD-10-CM | POA: Diagnosis not present

## 2014-02-20 DIAGNOSIS — I69991 Dysphagia following unspecified cerebrovascular disease: Secondary | ICD-10-CM | POA: Diagnosis not present

## 2014-02-21 DIAGNOSIS — I69959 Hemiplegia and hemiparesis following unspecified cerebrovascular disease affecting unspecified side: Secondary | ICD-10-CM | POA: Diagnosis not present

## 2014-02-21 DIAGNOSIS — I6789 Other cerebrovascular disease: Secondary | ICD-10-CM | POA: Diagnosis not present

## 2014-02-21 DIAGNOSIS — R279 Unspecified lack of coordination: Secondary | ICD-10-CM | POA: Diagnosis not present

## 2014-02-21 DIAGNOSIS — R41842 Visuospatial deficit: Secondary | ICD-10-CM | POA: Diagnosis not present

## 2014-02-21 DIAGNOSIS — I69991 Dysphagia following unspecified cerebrovascular disease: Secondary | ICD-10-CM | POA: Diagnosis not present

## 2014-02-21 DIAGNOSIS — I69922 Dysarthria following unspecified cerebrovascular disease: Secondary | ICD-10-CM | POA: Diagnosis not present

## 2014-02-22 DIAGNOSIS — I69922 Dysarthria following unspecified cerebrovascular disease: Secondary | ICD-10-CM | POA: Diagnosis not present

## 2014-02-22 DIAGNOSIS — R41842 Visuospatial deficit: Secondary | ICD-10-CM | POA: Diagnosis not present

## 2014-02-22 DIAGNOSIS — I69959 Hemiplegia and hemiparesis following unspecified cerebrovascular disease affecting unspecified side: Secondary | ICD-10-CM | POA: Diagnosis not present

## 2014-02-22 DIAGNOSIS — I6789 Other cerebrovascular disease: Secondary | ICD-10-CM | POA: Diagnosis not present

## 2014-02-22 DIAGNOSIS — I69991 Dysphagia following unspecified cerebrovascular disease: Secondary | ICD-10-CM | POA: Diagnosis not present

## 2014-02-22 DIAGNOSIS — R279 Unspecified lack of coordination: Secondary | ICD-10-CM | POA: Diagnosis not present

## 2014-02-23 DIAGNOSIS — I69991 Dysphagia following unspecified cerebrovascular disease: Secondary | ICD-10-CM | POA: Diagnosis not present

## 2014-02-23 DIAGNOSIS — I69922 Dysarthria following unspecified cerebrovascular disease: Secondary | ICD-10-CM | POA: Diagnosis not present

## 2014-02-23 DIAGNOSIS — R279 Unspecified lack of coordination: Secondary | ICD-10-CM | POA: Diagnosis not present

## 2014-02-23 DIAGNOSIS — I69959 Hemiplegia and hemiparesis following unspecified cerebrovascular disease affecting unspecified side: Secondary | ICD-10-CM | POA: Diagnosis not present

## 2014-02-23 DIAGNOSIS — I6789 Other cerebrovascular disease: Secondary | ICD-10-CM | POA: Diagnosis not present

## 2014-02-23 DIAGNOSIS — R41842 Visuospatial deficit: Secondary | ICD-10-CM | POA: Diagnosis not present

## 2014-02-24 DIAGNOSIS — I69991 Dysphagia following unspecified cerebrovascular disease: Secondary | ICD-10-CM | POA: Diagnosis not present

## 2014-02-24 DIAGNOSIS — I69959 Hemiplegia and hemiparesis following unspecified cerebrovascular disease affecting unspecified side: Secondary | ICD-10-CM | POA: Diagnosis not present

## 2014-02-24 DIAGNOSIS — R279 Unspecified lack of coordination: Secondary | ICD-10-CM | POA: Diagnosis not present

## 2014-02-24 DIAGNOSIS — I69922 Dysarthria following unspecified cerebrovascular disease: Secondary | ICD-10-CM | POA: Diagnosis not present

## 2014-02-24 DIAGNOSIS — I6789 Other cerebrovascular disease: Secondary | ICD-10-CM | POA: Diagnosis not present

## 2014-02-24 DIAGNOSIS — R41842 Visuospatial deficit: Secondary | ICD-10-CM | POA: Diagnosis not present

## 2014-02-27 ENCOUNTER — Non-Acute Institutional Stay (SKILLED_NURSING_FACILITY): Payer: Medicare Other | Admitting: Geriatric Medicine

## 2014-02-27 ENCOUNTER — Encounter: Payer: Self-pay | Admitting: Geriatric Medicine

## 2014-02-27 DIAGNOSIS — I69991 Dysphagia following unspecified cerebrovascular disease: Secondary | ICD-10-CM | POA: Diagnosis not present

## 2014-02-27 DIAGNOSIS — I69922 Dysarthria following unspecified cerebrovascular disease: Secondary | ICD-10-CM | POA: Diagnosis not present

## 2014-02-27 DIAGNOSIS — I639 Cerebral infarction, unspecified: Secondary | ICD-10-CM

## 2014-02-27 DIAGNOSIS — I69959 Hemiplegia and hemiparesis following unspecified cerebrovascular disease affecting unspecified side: Secondary | ICD-10-CM | POA: Diagnosis not present

## 2014-02-27 DIAGNOSIS — I6789 Other cerebrovascular disease: Secondary | ICD-10-CM | POA: Diagnosis not present

## 2014-02-27 DIAGNOSIS — I6350 Cerebral infarction due to unspecified occlusion or stenosis of unspecified cerebral artery: Secondary | ICD-10-CM | POA: Diagnosis not present

## 2014-02-27 DIAGNOSIS — M549 Dorsalgia, unspecified: Secondary | ICD-10-CM | POA: Diagnosis not present

## 2014-02-27 DIAGNOSIS — R41842 Visuospatial deficit: Secondary | ICD-10-CM | POA: Diagnosis not present

## 2014-02-27 DIAGNOSIS — R279 Unspecified lack of coordination: Secondary | ICD-10-CM | POA: Diagnosis not present

## 2014-02-27 NOTE — Assessment & Plan Note (Signed)
Left sided weakness, dysarthria, dysphagia. Participating in PT/OT/ST, was making progress with ambulation last week, today unable to stand without maximum support. Left arm dis-coordination a bit worse. Changes do not appear to be extension of CVA, may be related to poor sleep/ or UTI. Obtain urine for analysis.

## 2014-02-27 NOTE — Progress Notes (Signed)
Patient ID: Sonya [name], female   DOB: [birth year], 80 y.o.   MRN: [id number]   [facility] [phone])  Code Status: DNR  Contact Information   Name Relation Home Work [place] Friend [phone]     [place] [phone]     [place] [phone]   Roanna Epley Sister [phone]         Chief Complaint  Patient presents with  . left sided weakness    HPI: This is a 80 y.o. female resident of [place], [place] currently residing in Skilled rehab section after hospitalization re: CVA.  Evaluation is requested today due to increased left sided weakness.    Last visit:  CVA (cerebral vascular accident) Left sided weakness, dysarthria, dysphagia. Participating in PT/OT/ST, limited progress so far. Continue interventions to maximize functional status. Continue ASA, d/c rosuvastatin (pt. Has been refusing)  Dysphagia S/P CVA (cerebrovascular accident) Unable to manage thin liquids, doesn't like thickened liquids. Continue current restrictions for now to reduce aspiration risk. Continue ST interventions to maximize functional status. Recheck lab  Hypertension BP stable with current medication  Today nurse and PT report patient is less functional than last week. On Friday she was able to walk with her walker in the hallway, today PT can barely a sister to standing. Left arm is also noted to be discoordinated    Allergies  Allergen Reactions  . Plavix [Clopidogrel] Nausea And Vomiting  . Statins Other (See Comments)    Leg cramps    MEDICATIONS -  Reviewed  DATA REVIEWED  Radiologic Exams:   Cardiovascular Exams:   Laboratory Studies: Lab Results  Component Value Date   WBC 10.2 01/31/2014   HGB 14.1 01/31/2014   HCT 42.4 01/31/2014   MCV 91.0 01/31/2014   PLT 356 01/31/2014   Lab Results  Component Value Date   NA 144 02/04/2014   K 4.2  02/04/2014   GLU 99 09/22/2013   BUN 25* 02/04/2014   CREATININE 0.61 02/04/2014   Lab Results  Component Value Date   CALCIUM 9.0 02/04/2014   ALBUMIN 3.7 01/31/2014   AST 19 01/31/2014   ALT 19 01/31/2014   ALKPHOS 100 01/31/2014   BILITOT 0.5 01/31/2014   GFRNONAA 73* 02/04/2014   GFRAA 85* 02/04/2014   No results found for this basename: vitaminb12,  vitamind   Lab Results  Component Value Date   WBC 8.6 02/16/2014   HGB 12.8 02/16/2014   HCT 38 02/16/2014   MCV 91.0 01/31/2014   PLT 334 02/16/2014   Lab Results  Component Value Date   NA 139 02/16/2014   K 4.5 02/16/2014   GLU 101 02/16/2014   BUN 14 02/16/2014   CREATININE 0.8 02/16/2014   Albumin 3.3      REVIEW OF SYSTEMS  DATA OBTAINED: from patient, nurse, medical record,  GENERAL: Feels "OK"  No recent fever, change in appetite or weight. Feels tired, reports she hasn't slept well the last 3 nights. See HPI SKIN: No itch, rash or open wounds EYES: No eye pain, dryness or itching   Nearly Blind EARS: No earache, tinnitus, change in hearing NOSE: No congestion, drainage or bleeding MOUTH/THROAT: No mouth or tooth pain  No sore throat   Pureed diet, eating >75% most meals RESPIRATORY: No cough, wheezing, SOB CARDIAC: No chest pain, palpitations  No edema. GI: No abdominal pain  No nausea, vomiting,diarrhea. Feels constipated  No heartburn or reflux  GU: No dysuria, frequency or urgency  Has difficulty emptying bladder lately MUSCULOSKELETAL: No joint pain, swelling or stiffness  No back pain  No muscle ache, pain, weakness  NEUROLOGIC: No dizziness, fainting, headache. Numbness present left thumb, carpal tunnel syndrome, prior to CVA.  No change in mental status.  PSYCHIATRIC: No feelings of anxiety, depression   No behavior issue.   PHYSICAL EXAM Filed Vitals:   02/27/14 1115  BP: 136/64  Pulse: 72  Weight: 137 lb 3.2 oz (62.234 kg)   Body mass index is 26.8 kg/(m^2).  GENERAL APPEARANCE: No acute distress,  appropriately groomed, normal body habitus. Alert, pleasant, conversation is slow due to speech diffculty SKIN: No diaphoresis, rash, unusual lesions, wounds HEAD: Normocephalic, atraumatic EYES: Conjunctiva/lids clear. Left eye is clouded (chronic)  MOUTH/THROAT: Lips w/o lesions. Oral mucosa, tongue moist, w/o lesion. Oropharynx w/o redness or lesions.  RESPIRATORY: Breathing is even, unlabored. Lung sounds are clear and full.  CARDIOVASCULAR: Heart RRR. No murmur or extra heart sounds   EDEMA: No peripheral edema.  GASTROINTESTINAL: Abdomen is soft, non-tender, not distended w/ normal bowel sounds.  GENITOURINARY: Bladder non tender, not distended. MUSCULOSKELETAL: Moves Rt. Sided  extremities with full ROM, strength and tone. Left arm with mildly decreased strength, poor fine motor function of left hand, mild discoordination of arm. Left leg with very mild muscle weakness as compared to right, she is unable to stand today.  NEUROLOGIC: Oriented to time, place, person.  PSYCHIATRIC: Mood and affect appropriate to situation   ASSESSMENT/PLAN  CVA (cerebral vascular accident) Left sided weakness, dysarthria, dysphagia. Participating in PT/OT/ST, was making progress with ambulation last week, today unable to stand without maximum support. Left arm dis-coordination a bit worse. Changes do not appear to be extension of CVA, may be related to poor sleep/ or UTI. Obtain urine for analysis.     Family/ staff Communication:      Labs/tests ordered: U/A C&S   Follow up: As needed  Mardene Celeste, NP-C [place] [phone]  02/27/2014

## 2014-02-28 DIAGNOSIS — I69922 Dysarthria following unspecified cerebrovascular disease: Secondary | ICD-10-CM | POA: Diagnosis not present

## 2014-02-28 DIAGNOSIS — I6789 Other cerebrovascular disease: Secondary | ICD-10-CM | POA: Diagnosis not present

## 2014-02-28 DIAGNOSIS — I69991 Dysphagia following unspecified cerebrovascular disease: Secondary | ICD-10-CM | POA: Diagnosis not present

## 2014-02-28 DIAGNOSIS — R41842 Visuospatial deficit: Secondary | ICD-10-CM | POA: Diagnosis not present

## 2014-02-28 DIAGNOSIS — I69959 Hemiplegia and hemiparesis following unspecified cerebrovascular disease affecting unspecified side: Secondary | ICD-10-CM | POA: Diagnosis not present

## 2014-02-28 DIAGNOSIS — R279 Unspecified lack of coordination: Secondary | ICD-10-CM | POA: Diagnosis not present

## 2014-03-01 DIAGNOSIS — I69959 Hemiplegia and hemiparesis following unspecified cerebrovascular disease affecting unspecified side: Secondary | ICD-10-CM | POA: Diagnosis not present

## 2014-03-01 DIAGNOSIS — R279 Unspecified lack of coordination: Secondary | ICD-10-CM | POA: Diagnosis not present

## 2014-03-01 DIAGNOSIS — I69991 Dysphagia following unspecified cerebrovascular disease: Secondary | ICD-10-CM | POA: Diagnosis not present

## 2014-03-01 DIAGNOSIS — I6789 Other cerebrovascular disease: Secondary | ICD-10-CM | POA: Diagnosis not present

## 2014-03-01 DIAGNOSIS — R41842 Visuospatial deficit: Secondary | ICD-10-CM | POA: Diagnosis not present

## 2014-03-01 DIAGNOSIS — I69922 Dysarthria following unspecified cerebrovascular disease: Secondary | ICD-10-CM | POA: Diagnosis not present

## 2014-03-02 DIAGNOSIS — R41842 Visuospatial deficit: Secondary | ICD-10-CM | POA: Diagnosis not present

## 2014-03-02 DIAGNOSIS — I69959 Hemiplegia and hemiparesis following unspecified cerebrovascular disease affecting unspecified side: Secondary | ICD-10-CM | POA: Diagnosis not present

## 2014-03-02 DIAGNOSIS — R279 Unspecified lack of coordination: Secondary | ICD-10-CM | POA: Diagnosis not present

## 2014-03-02 DIAGNOSIS — I69922 Dysarthria following unspecified cerebrovascular disease: Secondary | ICD-10-CM | POA: Diagnosis not present

## 2014-03-02 DIAGNOSIS — I69991 Dysphagia following unspecified cerebrovascular disease: Secondary | ICD-10-CM | POA: Diagnosis not present

## 2014-03-02 DIAGNOSIS — I6789 Other cerebrovascular disease: Secondary | ICD-10-CM | POA: Diagnosis not present

## 2014-03-03 ENCOUNTER — Encounter: Payer: Self-pay | Admitting: Geriatric Medicine

## 2014-03-03 ENCOUNTER — Non-Acute Institutional Stay (SKILLED_NURSING_FACILITY): Payer: Medicare Other | Admitting: Geriatric Medicine

## 2014-03-03 DIAGNOSIS — I69991 Dysphagia following unspecified cerebrovascular disease: Secondary | ICD-10-CM | POA: Diagnosis not present

## 2014-03-03 DIAGNOSIS — I69959 Hemiplegia and hemiparesis following unspecified cerebrovascular disease affecting unspecified side: Secondary | ICD-10-CM | POA: Diagnosis not present

## 2014-03-03 DIAGNOSIS — I6350 Cerebral infarction due to unspecified occlusion or stenosis of unspecified cerebral artery: Secondary | ICD-10-CM

## 2014-03-03 DIAGNOSIS — R41842 Visuospatial deficit: Secondary | ICD-10-CM | POA: Diagnosis not present

## 2014-03-03 DIAGNOSIS — R279 Unspecified lack of coordination: Secondary | ICD-10-CM | POA: Diagnosis not present

## 2014-03-03 DIAGNOSIS — I69922 Dysarthria following unspecified cerebrovascular disease: Secondary | ICD-10-CM | POA: Diagnosis not present

## 2014-03-03 DIAGNOSIS — I69391 Dysphagia following cerebral infarction: Secondary | ICD-10-CM

## 2014-03-03 DIAGNOSIS — I6789 Other cerebrovascular disease: Secondary | ICD-10-CM | POA: Diagnosis not present

## 2014-03-03 DIAGNOSIS — I639 Cerebral infarction, unspecified: Secondary | ICD-10-CM

## 2014-03-03 NOTE — Assessment & Plan Note (Signed)
Patient is tolerating pured diet and thickened liquids, eating >50% at most meals w/o coughing or choking. ST notes increased drooling today. Continue efforts to improve functional swallow.

## 2014-03-03 NOTE — Progress Notes (Signed)
Patient ID: TONIANNE FINE, female   DOB: [birth year], 80 y.o.   MRN: [id number] Patient ID: CINDEL DAUGHERTY, female   DOB: 1913/04/03, 80 y.o.   MRN: [id number]   [facility] [phone])  Code Status: DNR  Contact Information   Name Relation Home Work Mobile   [place] Friend [phone]     [place] [phone]   Roanna Epley Sister [phone]  [phone]   [place] Friend [phone]         Chief Complaint  Patient presents with  . Cerebrovascular Accident    HPI: This is a 80 y.o. female resident of [place], [place]  Section currently residing in Skilled rehab section after hospitalization re: CVA.  Evaluated today in follow up of increased left sided weakness.    Last visit:  CVA (cerebral vascular accident) Left sided weakness, dysarthria, dysphagia. Participating in PT/OT/ST, was making progress with ambulation last week, today unable to stand without maximum support. Left arm dis-coordination a bit worse. Changes do not appear to be extension of CVA, may be related to poor sleep/ or UTI. Obtain urine for analysis.   Since last visit patient's general level of alertness has improved, she's had minimal improvement in her general strength as well. She is still not able to ambulate but is able to exercise with the bicycle today. ST reports more drooling, no problem with swallowing modified diet. Urinalysis and culture were negative for infection, patient has had constipation relieved over the last 2 days. She has been eating and drinking well, VSS.   Allergies  Allergen Reactions  . Plavix [Clopidogrel] Nausea And Vomiting  . Statins Other (See Comments)    Leg cramps    MEDICATIONS -  Reviewed  DATA REVIEWED  Radiologic Exams:   Cardiovascular Exams:   Laboratory Studies:  Lab Results  Component Value Date   WBC 8.6 02/16/2014   HGB 12.8 02/16/2014   HCT 38  02/16/2014   MCV 91.0 01/31/2014   PLT 334 02/16/2014   Lab Results  Component Value Date   NA 139 02/16/2014   K 4.5 02/16/2014   GLU 101 02/16/2014   BUN 14 02/16/2014   CREATININE 0.8 02/16/2014   Albumin 3.3    03/02/2014 Urine culture: 30,000 colonies E.coli  REVIEW OF SYSTEMS  DATA OBTAINED: from patient, nurse, medical record,  GENERAL: Feels "OK, a little better"  No recent fever, change in appetite or weight.  SKIN: No itch, rash or open wounds EYES: No eye pain, dryness or itching   Nearly Blind EARS: No earache, tinnitus, change in hearing NOSE: No congestion, drainage or bleeding MOUTH/THROAT: No mouth or tooth pain  No sore throat   Pureed diet, eating >75% most meals RESPIRATORY: No cough, wheezing, SOB CARDIAC: No chest pain, palpitations  No edema. GI: No abdominal pain  No nausea, vomiting,diarrhea. Bowels working better. No heartburn or reflux  GU: No dysuria, frequency or urgency  . No c/o ZC:HYIFOYD emptying MUSCULOSKELETAL: No joint pain, swelling or stiffness  No back pain  No muscle ache, pain, weakness   NEUROLOGIC: No dizziness, fainting, headache. Numbness present left thumb, carpal tunnel syndrome, prior to CVA.  No change in mental status.  PSYCHIATRIC: No feelings of anxiety, depression   No behavior issue.   PHYSICAL EXAM Filed Vitals:   03/03/14 1447  BP: 148/90  Pulse: 86  Temp: 96.8 F (36 C)  Weight: 135 lb 12.8 oz (61.598 kg)  SpO2: 96%   Body  mass index is 26.52 kg/(m^2).  GENERAL APPEARANCE: No acute distress, appropriately groomed, normal body habitus. Alert, pleasant, conversation is slow due to speech difficulty, improved since last visit SKIN: No diaphoresis, rash, unusual lesions, wounds HEAD: Normocephalic, atraumatic. Face w/ mild left sided droop, small amount of drool, pt. Is aware EYES: Conjunctiva/lids clear. Left eye is clouded (chronic)  MOUTH/THROAT: Lips w/o lesions. Oral mucosa, tongue moist, w/o lesion. Oropharynx w/o redness  or lesions.  RESPIRATORY: Breathing is even, unlabored. Lung sounds are clear and full.  CARDIOVASCULAR: Heart RRR. No murmur or extra heart sounds   EDEMA: No peripheral edema.  GASTROINTESTINAL: Abdomen is soft, non-tender, not distended w/ normal bowel sounds.  GENITOURINARY: Bladder non tender, not distended. MUSCULOSKELETAL: Moves Rt. Sided  extremities with full ROM, strength and tone. Left arm remains with mildly decreased strength, poor fine motor function of left hand, mild discoordination of arm. Left leg with very mild muscle weakness as compared to right, she is unable to stand today.  NEUROLOGIC: Oriented to time, place, person.  PSYCHIATRIC: Mood and affect appropriate to situation   ASSESSMENT/PLAN  CVA (cerebral vascular accident) Decreased functional status this week due to  worsening weakness of left arm and left leg. This does not appear to be due to infectious process; most likely a small extension of CVA. Patient's general condition is improved today, continue PT/OT/ST interventions to maximize functional status.   Dysphagia S/P CVA (cerebrovascular accident) Patient is tolerating pured diet and thickened liquids, eating >50% at most meals w/o coughing or choking. ST notes increased drooling today. Continue efforts to improve functional swallow.     Family/ staff Communication:      Labs/tests ordered:   Follow up: As needed  Mardene Celeste, NP-C [facility] [phone]  03/03/2014

## 2014-03-03 NOTE — Assessment & Plan Note (Signed)
Decreased functional status this week due to  worsening weakness of left arm and left leg. This does not appear to be due to infectious process; most likely a small extension of CVA. Patient's general condition is improved today, continue PT/OT/ST interventions to maximize functional status.

## 2014-03-06 DIAGNOSIS — R41842 Visuospatial deficit: Secondary | ICD-10-CM | POA: Diagnosis not present

## 2014-03-06 DIAGNOSIS — R279 Unspecified lack of coordination: Secondary | ICD-10-CM | POA: Diagnosis not present

## 2014-03-06 DIAGNOSIS — I69959 Hemiplegia and hemiparesis following unspecified cerebrovascular disease affecting unspecified side: Secondary | ICD-10-CM | POA: Diagnosis not present

## 2014-03-06 DIAGNOSIS — I69922 Dysarthria following unspecified cerebrovascular disease: Secondary | ICD-10-CM | POA: Diagnosis not present

## 2014-03-06 DIAGNOSIS — I6789 Other cerebrovascular disease: Secondary | ICD-10-CM | POA: Diagnosis not present

## 2014-03-06 DIAGNOSIS — I69991 Dysphagia following unspecified cerebrovascular disease: Secondary | ICD-10-CM | POA: Diagnosis not present

## 2014-03-07 DIAGNOSIS — I69959 Hemiplegia and hemiparesis following unspecified cerebrovascular disease affecting unspecified side: Secondary | ICD-10-CM | POA: Diagnosis not present

## 2014-03-07 DIAGNOSIS — I69922 Dysarthria following unspecified cerebrovascular disease: Secondary | ICD-10-CM | POA: Diagnosis not present

## 2014-03-07 DIAGNOSIS — R41842 Visuospatial deficit: Secondary | ICD-10-CM | POA: Diagnosis not present

## 2014-03-07 DIAGNOSIS — I69991 Dysphagia following unspecified cerebrovascular disease: Secondary | ICD-10-CM | POA: Diagnosis not present

## 2014-03-07 DIAGNOSIS — R279 Unspecified lack of coordination: Secondary | ICD-10-CM | POA: Diagnosis not present

## 2014-03-07 DIAGNOSIS — I6789 Other cerebrovascular disease: Secondary | ICD-10-CM | POA: Diagnosis not present

## 2014-03-08 DIAGNOSIS — I69922 Dysarthria following unspecified cerebrovascular disease: Secondary | ICD-10-CM | POA: Diagnosis not present

## 2014-03-08 DIAGNOSIS — I6789 Other cerebrovascular disease: Secondary | ICD-10-CM | POA: Diagnosis not present

## 2014-03-08 DIAGNOSIS — R41842 Visuospatial deficit: Secondary | ICD-10-CM | POA: Diagnosis not present

## 2014-03-08 DIAGNOSIS — R279 Unspecified lack of coordination: Secondary | ICD-10-CM | POA: Diagnosis not present

## 2014-03-08 DIAGNOSIS — I69959 Hemiplegia and hemiparesis following unspecified cerebrovascular disease affecting unspecified side: Secondary | ICD-10-CM | POA: Diagnosis not present

## 2014-03-08 DIAGNOSIS — I69991 Dysphagia following unspecified cerebrovascular disease: Secondary | ICD-10-CM | POA: Diagnosis not present

## 2014-03-09 DIAGNOSIS — I69959 Hemiplegia and hemiparesis following unspecified cerebrovascular disease affecting unspecified side: Secondary | ICD-10-CM | POA: Diagnosis not present

## 2014-03-09 DIAGNOSIS — R279 Unspecified lack of coordination: Secondary | ICD-10-CM | POA: Diagnosis not present

## 2014-03-09 DIAGNOSIS — R41842 Visuospatial deficit: Secondary | ICD-10-CM | POA: Diagnosis not present

## 2014-03-09 DIAGNOSIS — I69991 Dysphagia following unspecified cerebrovascular disease: Secondary | ICD-10-CM | POA: Diagnosis not present

## 2014-03-09 DIAGNOSIS — I69922 Dysarthria following unspecified cerebrovascular disease: Secondary | ICD-10-CM | POA: Diagnosis not present

## 2014-03-09 DIAGNOSIS — I6789 Other cerebrovascular disease: Secondary | ICD-10-CM | POA: Diagnosis not present

## 2014-03-10 DIAGNOSIS — I69959 Hemiplegia and hemiparesis following unspecified cerebrovascular disease affecting unspecified side: Secondary | ICD-10-CM | POA: Diagnosis not present

## 2014-03-10 DIAGNOSIS — I6789 Other cerebrovascular disease: Secondary | ICD-10-CM | POA: Diagnosis not present

## 2014-03-10 DIAGNOSIS — R279 Unspecified lack of coordination: Secondary | ICD-10-CM | POA: Diagnosis not present

## 2014-03-10 DIAGNOSIS — R41842 Visuospatial deficit: Secondary | ICD-10-CM | POA: Diagnosis not present

## 2014-03-10 DIAGNOSIS — I69922 Dysarthria following unspecified cerebrovascular disease: Secondary | ICD-10-CM | POA: Diagnosis not present

## 2014-03-10 DIAGNOSIS — I69991 Dysphagia following unspecified cerebrovascular disease: Secondary | ICD-10-CM | POA: Diagnosis not present

## 2014-03-13 DIAGNOSIS — I69991 Dysphagia following unspecified cerebrovascular disease: Secondary | ICD-10-CM | POA: Diagnosis not present

## 2014-03-13 DIAGNOSIS — I6789 Other cerebrovascular disease: Secondary | ICD-10-CM | POA: Diagnosis not present

## 2014-03-13 DIAGNOSIS — I69922 Dysarthria following unspecified cerebrovascular disease: Secondary | ICD-10-CM | POA: Diagnosis not present

## 2014-03-13 DIAGNOSIS — R41842 Visuospatial deficit: Secondary | ICD-10-CM | POA: Diagnosis not present

## 2014-03-13 DIAGNOSIS — R279 Unspecified lack of coordination: Secondary | ICD-10-CM | POA: Diagnosis not present

## 2014-03-13 DIAGNOSIS — I69959 Hemiplegia and hemiparesis following unspecified cerebrovascular disease affecting unspecified side: Secondary | ICD-10-CM | POA: Diagnosis not present

## 2014-03-14 ENCOUNTER — Encounter: Payer: Self-pay | Admitting: Adult Health

## 2014-03-14 ENCOUNTER — Non-Acute Institutional Stay (SKILLED_NURSING_FACILITY): Payer: Medicare Other | Admitting: Adult Health

## 2014-03-14 DIAGNOSIS — G5600 Carpal tunnel syndrome, unspecified upper limb: Secondary | ICD-10-CM | POA: Diagnosis not present

## 2014-03-14 DIAGNOSIS — I10 Essential (primary) hypertension: Secondary | ICD-10-CM | POA: Diagnosis not present

## 2014-03-14 DIAGNOSIS — I69991 Dysphagia following unspecified cerebrovascular disease: Secondary | ICD-10-CM

## 2014-03-14 DIAGNOSIS — R41842 Visuospatial deficit: Secondary | ICD-10-CM | POA: Diagnosis not present

## 2014-03-14 DIAGNOSIS — I6350 Cerebral infarction due to unspecified occlusion or stenosis of unspecified cerebral artery: Secondary | ICD-10-CM | POA: Diagnosis not present

## 2014-03-14 DIAGNOSIS — I6789 Other cerebrovascular disease: Secondary | ICD-10-CM | POA: Diagnosis not present

## 2014-03-14 DIAGNOSIS — I639 Cerebral infarction, unspecified: Secondary | ICD-10-CM

## 2014-03-14 DIAGNOSIS — R279 Unspecified lack of coordination: Secondary | ICD-10-CM | POA: Diagnosis not present

## 2014-03-14 DIAGNOSIS — I69922 Dysarthria following unspecified cerebrovascular disease: Secondary | ICD-10-CM | POA: Diagnosis not present

## 2014-03-14 DIAGNOSIS — I69391 Dysphagia following cerebral infarction: Secondary | ICD-10-CM

## 2014-03-14 DIAGNOSIS — I69959 Hemiplegia and hemiparesis following unspecified cerebrovascular disease affecting unspecified side: Secondary | ICD-10-CM | POA: Diagnosis not present

## 2014-03-14 DIAGNOSIS — R339 Retention of urine, unspecified: Secondary | ICD-10-CM

## 2014-03-14 NOTE — Assessment & Plan Note (Signed)
Had nerve conduction studies and hand injection for this 2 weeks prior to CVA, and had some relief, however is complaining of increased numbness and discomfort. Carpal tunnel brace ordered. Will have patient wear at night once proper fit is obtained

## 2014-03-14 NOTE — Assessment & Plan Note (Signed)
Patient is tired today due to frequent visitors and busy day with rehab therapies, but is making overall improvement in condition. Her cognition, speech, and drooling are more consistent with her pre-stroke status when she did not have difficulty. She stands with the stand up lift. Is continuing to work with PT/OT/ST to maximize functional status.

## 2014-03-14 NOTE — Progress Notes (Addendum)
Patient ID: Sonya [name], female   DOB: [birth year], 80 y.o.   MRN: [id number]   [facility] ([phone])  Code status: Full Code  Chief Complaint  Patient presents with  . Cerebrovascular Accident  . Hypertension  . Dysphagia  . Carpal Tunnel    HPI: This is a 80 y.o. Female resident in rehab at [facility] following a CVA in early February.  We are asked to see pt regarding consistently elevated BP for 2 weeks. Staff also reports 1 coughing episode during breakfast yesterday, and patient continuing to require instruction for safe swallow  Last visit 03/03/14 CVA (cerebral vascular accident)  Decreased functional status this week due to worsening weakness of left arm and left leg. This does not appear to be due to infectious process; most likely a small extension of CVA. Patient's general condition is improved today, continue PT/OT/ST interventions to maximize functional status.  Dysphagia S/P CVA (cerebrovascular accident)  Patient is tolerating pured diet and thickened liquids, eating >50% at most meals w/o coughing or choking. ST notes increased drooling today. Continue efforts to improve functional swallow.      Allergies  Allergen Reactions  . Plavix [Clopidogrel] Nausea And Vomiting  . Statins Other (See Comments)    Leg cramps    MEDICATIONS -  Lasix 20mg  daily added for HTN  DATA REVIEWED   Laboratory Studies: Lab Results  Component Value Date   NA 139 02/16/2014   K 4.5 02/16/2014   GLU 101 02/16/2014   BUN 14 02/16/2014   CREATININE 0.8 02/16/2014    . Lab Results  Component Value Date   WBC 8.6 02/16/2014   HGB 12.8 02/16/2014   HCT 38 02/16/2014   MCV 91.0 01/31/2014   PLT 334 02/16/2014       REVIEW OF SYSTEMS  DATA OBTAINED: from patient, nurse, medical record,  GENERAL: Feels "tired today. I've had a lot of company today." No recent fever, change in appetite or weight.  SKIN: No itch, rash or open wounds  EYES: No eye pain,  dryness or itching Nearly Blind  EARS: No earache, tinnitus, change in hearing  NOSE: No congestion, drainage or bleeding  MOUTH/THROAT: No mouth or tooth pain No sore throat Pureed diet, chopped meats, eating >75% most meals  RESPIRATORY: No cough, wheezing, SOB  CARDIAC: No chest pain, palpitations No edema.  GI: No abdominal pain No nausea, vomiting,diarrhea. Bowels working better with commode vs bedpan. No heartburn or reflux  GU: c/o frequent urge with incomplete emptying or unable to void MUSCULOSKELETAL: still unable to stand on her own, complains of increased numbness and discomfort to left carpal tunnel hand.  NEUROLOGIC: No dizziness, fainting, headache. Numbness present left thumb, carpal tunnel syndrome, prior to CVA. No change in mental status.  PSYCHIATRIC: No feelings of anxiety, depression No behavior issue   PHYSICAL EXAM Filed Vitals:   03/14/14 1436  BP: 163/71  Pulse: 63  Temp: 97.1 F (36.2 C)  Resp: 17  Weight: 135 lb 9.6 oz (61.508 kg)  SpO2: 93%   Body mass index is 26.48 kg/(m^2).  GENERAL APPEARANCE: No acute distress, appropriately groomed, normal body habitus. Alert, pleasant, conversation is appropriately paced, pt is finding words faster, pronunciation improved, minimal slurring, improved since last visit  SKIN: No diaphoresis, rash, unusual lesions, wounds  HEAD: Normocephalic, atraumatic. Face w/ mild left sided droop, small amount of drool, pt. Is aware, improved since last visit  EYES: Conjunctiva/lids clear. Left cornea is opaque (chronic)  MOUTH/THROAT: Lips w/o lesions. Oral mucosa, tongue moist, w/o lesion. Oropharynx w/o redness or lesions.  RESPIRATORY: Breathing is even, unlabored. Lung sounds are clear and full.  CARDIOVASCULAR: Heart RRR. No murmur or extra heart sounds  EDEMA: No peripheral edema.  GASTROINTESTINAL: Abdomen is soft, non-tender, not distended w/ normal bowel sounds.  MUSCULOSKELETAL: Moves Rt. Sided extremities with full  ROM, strength and tone. Left arm remains with mildly decreased strength, poor fine motor function of left hand, mild discoordination of arm.  NEUROLOGIC: Oriented to time, place, person.  PSYCHIATRIC: Mood and affect appropriate to situation, jokes appropriately with me    ASSESSMENT/PLAN  CVA (cerebral vascular accident) Patient is tired today due to frequent visitors and busy day with rehab therapies, but is making overall improvement in condition. Her cognition, speech, and drooling are more consistent with her pre-stroke status when she did not have difficulty. She stands with the stand up lift. Is continuing to work with PT/OT/ST to maximize functional status.   Hypertension BP has been elevated since 3/9 after suspected small extension of CVA. Last several BPs 188/72, 193/75, 167/66, 173/67. Her HR is 57-65. Start Lasix 20mg  daily. Will not increase metoprolol or isosorbide at this time. Lasix may likely be short term following resolution of CVA extension. Will get BMET Thursday 03/15/14  Dysphagia S/P CVA (cerebrovascular accident) Had a coughing episode with breakfast yesterday, but pt states she was not practicing proper technique and will be more deliberate. Overall, continuing to make progress with ST. Diet progressed to pureed with chopped meats. Speech is significantly improved. Minimal slurring. Improved humor and banter. Drooling has decreased. No confusion. ST to continue  Carpal tunnel syndrome Had nerve conduction studies and hand injection for this 2 weeks prior to CVA, and had some relief, however is complaining of increased numbness and discomfort. Carpal tunnel brace ordered. Will have patient wear at night once proper fit is obtained  Urinary retention Continues to complain of difficulty urinating, frequent urge with little or no result. UA/CS negative for acute infection. Chronic bacteruria. Lasix may help with this problem, also. Pt is also being transferred to toilet  instead of just bedpan which may aid in complete emptying. follow   Family/ staff Communication:  Pt instructed to call for staff when she needs help with repositioning or would like to use the commode. Staff notified that patient is reluctant to call because she does not want to bother staff. They will also encourage staff utilization Patient requests message sent to both Dr.Barnes (orhtopedics) and Dr.Skains(cardiology) re: her current status. Will send today's note to both physicians  Labs/tests ordered: BMET 03/15/14, carpal tunnel brace ordered   Follow up: as needed  Jasan Doughtie T.Ojas Coone, NP-C/[facility], [place] [phone]  03/14/2014

## 2014-03-14 NOTE — Assessment & Plan Note (Signed)
BP has been elevated since 3/9 after suspected small extension of CVA. Last several BPs 188/72, 193/75, 167/66, 173/67. Her HR is 57-65. Start Lasix 20mg  daily. Will not increase metoprolol or isosorbide at this time. Lasix may likely be short term following resolution of CVA extension. Will get [facility] Thursday 03/15/14

## 2014-03-14 NOTE — Assessment & Plan Note (Signed)
Had a coughing episode with breakfast yesterday, but pt states she was not practicing proper technique and will be more deliberate. Overall, continuing to make progress with ST. Diet progressed to pureed with chopped meats. Speech is significantly improved. Minimal slurring. Improved humor and banter. Drooling has decreased. No confusion. ST to continue

## 2014-03-14 NOTE — Assessment & Plan Note (Signed)
Continues to complain of difficulty urinating, frequent urge with little or no result. UA/CS negative for acute infection. Chronic bacteruria. Lasix may help with this problem, also. Pt is also being transferred to toilet instead of just bedpan which may aid in complete emptying. follow

## 2014-03-15 DIAGNOSIS — R41842 Visuospatial deficit: Secondary | ICD-10-CM | POA: Diagnosis not present

## 2014-03-15 DIAGNOSIS — I69922 Dysarthria following unspecified cerebrovascular disease: Secondary | ICD-10-CM | POA: Diagnosis not present

## 2014-03-15 DIAGNOSIS — R279 Unspecified lack of coordination: Secondary | ICD-10-CM | POA: Diagnosis not present

## 2014-03-15 DIAGNOSIS — I6789 Other cerebrovascular disease: Secondary | ICD-10-CM | POA: Diagnosis not present

## 2014-03-15 DIAGNOSIS — I69991 Dysphagia following unspecified cerebrovascular disease: Secondary | ICD-10-CM | POA: Diagnosis not present

## 2014-03-15 DIAGNOSIS — I69959 Hemiplegia and hemiparesis following unspecified cerebrovascular disease affecting unspecified side: Secondary | ICD-10-CM | POA: Diagnosis not present

## 2014-03-16 DIAGNOSIS — I69959 Hemiplegia and hemiparesis following unspecified cerebrovascular disease affecting unspecified side: Secondary | ICD-10-CM | POA: Diagnosis not present

## 2014-03-16 DIAGNOSIS — I6789 Other cerebrovascular disease: Secondary | ICD-10-CM | POA: Diagnosis not present

## 2014-03-16 DIAGNOSIS — I10 Essential (primary) hypertension: Secondary | ICD-10-CM | POA: Diagnosis not present

## 2014-03-16 DIAGNOSIS — I509 Heart failure, unspecified: Secondary | ICD-10-CM | POA: Diagnosis not present

## 2014-03-16 DIAGNOSIS — R41842 Visuospatial deficit: Secondary | ICD-10-CM | POA: Diagnosis not present

## 2014-03-16 DIAGNOSIS — I69991 Dysphagia following unspecified cerebrovascular disease: Secondary | ICD-10-CM | POA: Diagnosis not present

## 2014-03-16 DIAGNOSIS — R279 Unspecified lack of coordination: Secondary | ICD-10-CM | POA: Diagnosis not present

## 2014-03-16 DIAGNOSIS — I69922 Dysarthria following unspecified cerebrovascular disease: Secondary | ICD-10-CM | POA: Diagnosis not present

## 2014-03-16 LAB — BASIC METABOLIC PANEL
BUN: 13 mg/dL (ref 4–21)
Creatinine: 0.6 mg/dL (ref 0.5–1.1)
GLUCOSE: 85 mg/dL
Potassium: 3.8 mmol/L (ref 3.4–5.3)
Sodium: 138 mmol/L (ref 137–147)

## 2014-03-17 DIAGNOSIS — R41842 Visuospatial deficit: Secondary | ICD-10-CM | POA: Diagnosis not present

## 2014-03-17 DIAGNOSIS — I6789 Other cerebrovascular disease: Secondary | ICD-10-CM | POA: Diagnosis not present

## 2014-03-17 DIAGNOSIS — I69959 Hemiplegia and hemiparesis following unspecified cerebrovascular disease affecting unspecified side: Secondary | ICD-10-CM | POA: Diagnosis not present

## 2014-03-17 DIAGNOSIS — R279 Unspecified lack of coordination: Secondary | ICD-10-CM | POA: Diagnosis not present

## 2014-03-17 DIAGNOSIS — I69991 Dysphagia following unspecified cerebrovascular disease: Secondary | ICD-10-CM | POA: Diagnosis not present

## 2014-03-17 DIAGNOSIS — I69922 Dysarthria following unspecified cerebrovascular disease: Secondary | ICD-10-CM | POA: Diagnosis not present

## 2014-03-20 DIAGNOSIS — I69991 Dysphagia following unspecified cerebrovascular disease: Secondary | ICD-10-CM | POA: Diagnosis not present

## 2014-03-20 DIAGNOSIS — I6789 Other cerebrovascular disease: Secondary | ICD-10-CM | POA: Diagnosis not present

## 2014-03-20 DIAGNOSIS — I69922 Dysarthria following unspecified cerebrovascular disease: Secondary | ICD-10-CM | POA: Diagnosis not present

## 2014-03-20 DIAGNOSIS — R41842 Visuospatial deficit: Secondary | ICD-10-CM | POA: Diagnosis not present

## 2014-03-20 DIAGNOSIS — R279 Unspecified lack of coordination: Secondary | ICD-10-CM | POA: Diagnosis not present

## 2014-03-20 DIAGNOSIS — I69959 Hemiplegia and hemiparesis following unspecified cerebrovascular disease affecting unspecified side: Secondary | ICD-10-CM | POA: Diagnosis not present

## 2014-03-21 DIAGNOSIS — I6789 Other cerebrovascular disease: Secondary | ICD-10-CM | POA: Diagnosis not present

## 2014-03-21 DIAGNOSIS — R279 Unspecified lack of coordination: Secondary | ICD-10-CM | POA: Diagnosis not present

## 2014-03-21 DIAGNOSIS — I69922 Dysarthria following unspecified cerebrovascular disease: Secondary | ICD-10-CM | POA: Diagnosis not present

## 2014-03-21 DIAGNOSIS — I69991 Dysphagia following unspecified cerebrovascular disease: Secondary | ICD-10-CM | POA: Diagnosis not present

## 2014-03-21 DIAGNOSIS — I69959 Hemiplegia and hemiparesis following unspecified cerebrovascular disease affecting unspecified side: Secondary | ICD-10-CM | POA: Diagnosis not present

## 2014-03-21 DIAGNOSIS — R41842 Visuospatial deficit: Secondary | ICD-10-CM | POA: Diagnosis not present

## 2014-03-22 DIAGNOSIS — M6281 Muscle weakness (generalized): Secondary | ICD-10-CM | POA: Diagnosis not present

## 2014-03-22 DIAGNOSIS — I69959 Hemiplegia and hemiparesis following unspecified cerebrovascular disease affecting unspecified side: Secondary | ICD-10-CM | POA: Diagnosis not present

## 2014-03-22 DIAGNOSIS — R41842 Visuospatial deficit: Secondary | ICD-10-CM | POA: Diagnosis not present

## 2014-03-22 DIAGNOSIS — I69991 Dysphagia following unspecified cerebrovascular disease: Secondary | ICD-10-CM | POA: Diagnosis not present

## 2014-03-22 DIAGNOSIS — R1312 Dysphagia, oropharyngeal phase: Secondary | ICD-10-CM | POA: Diagnosis not present

## 2014-03-22 DIAGNOSIS — I6789 Other cerebrovascular disease: Secondary | ICD-10-CM | POA: Diagnosis not present

## 2014-03-22 DIAGNOSIS — R269 Unspecified abnormalities of gait and mobility: Secondary | ICD-10-CM | POA: Diagnosis not present

## 2014-03-22 DIAGNOSIS — I69922 Dysarthria following unspecified cerebrovascular disease: Secondary | ICD-10-CM | POA: Diagnosis not present

## 2014-03-22 DIAGNOSIS — R279 Unspecified lack of coordination: Secondary | ICD-10-CM | POA: Diagnosis not present

## 2014-03-23 ENCOUNTER — Encounter: Payer: Self-pay | Admitting: Geriatric Medicine

## 2014-03-23 ENCOUNTER — Encounter: Payer: Self-pay | Admitting: Neurology

## 2014-03-23 ENCOUNTER — Non-Acute Institutional Stay (SKILLED_NURSING_FACILITY): Payer: Medicare Other | Admitting: Geriatric Medicine

## 2014-03-23 ENCOUNTER — Encounter (INDEPENDENT_AMBULATORY_CARE_PROVIDER_SITE_OTHER): Payer: Self-pay

## 2014-03-23 ENCOUNTER — Ambulatory Visit (INDEPENDENT_AMBULATORY_CARE_PROVIDER_SITE_OTHER): Payer: Medicare Other | Admitting: Neurology

## 2014-03-23 VITALS — BP 161/70 | HR 66

## 2014-03-23 DIAGNOSIS — I2510 Atherosclerotic heart disease of native coronary artery without angina pectoris: Secondary | ICD-10-CM | POA: Diagnosis not present

## 2014-03-23 DIAGNOSIS — I69991 Dysphagia following unspecified cerebrovascular disease: Secondary | ICD-10-CM | POA: Diagnosis not present

## 2014-03-23 DIAGNOSIS — I6350 Cerebral infarction due to unspecified occlusion or stenosis of unspecified cerebral artery: Secondary | ICD-10-CM

## 2014-03-23 DIAGNOSIS — I6381 Other cerebral infarction due to occlusion or stenosis of small artery: Secondary | ICD-10-CM | POA: Insufficient documentation

## 2014-03-23 DIAGNOSIS — R339 Retention of urine, unspecified: Secondary | ICD-10-CM

## 2014-03-23 DIAGNOSIS — I10 Essential (primary) hypertension: Secondary | ICD-10-CM

## 2014-03-23 DIAGNOSIS — K5900 Constipation, unspecified: Secondary | ICD-10-CM | POA: Insufficient documentation

## 2014-03-23 DIAGNOSIS — I69391 Dysphagia following cerebral infarction: Secondary | ICD-10-CM

## 2014-03-23 DIAGNOSIS — G5600 Carpal tunnel syndrome, unspecified upper limb: Secondary | ICD-10-CM | POA: Diagnosis not present

## 2014-03-23 DIAGNOSIS — E785 Hyperlipidemia, unspecified: Secondary | ICD-10-CM

## 2014-03-23 MED ORDER — SENNA-DOCUSATE SODIUM 8.6-50 MG PO TABS: 2.0000 | ORAL_TABLET | Freq: Every day | ORAL | Status: DC

## 2014-03-23 MED ORDER — SIMVASTATIN 20 MG PO TABS: 20.0000 mg | ORAL_TABLET | Freq: Every day | ORAL | Status: DC

## 2014-03-23 NOTE — Patient Instructions (Addendum)
I had a long discussion with the patient and her friend regarding her recent strokes, and discuss the results of evaluation, tests and answered questions. Continue aspirin for secondary stroke prevention and strict control of hypertension with blood pressure goal below 130/90.Add zocor for lipids with LDL goal below 100 mg%. Continue ongoing physical and occupational therapy. Return for followup in 6 months or call earlier if necessary  Stroke Prevention Some medical conditions and behaviors are associated with an increased chance of having a stroke. You may prevent a stroke by making healthy choices and managing medical conditions. HOW CAN I REDUCE MY RISK OF HAVING A STROKE?   Stay physically active. Get at least 30 minutes of activity on most or all days.  Do not smoke. It may also be helpful to avoid exposure to secondhand smoke.  Limit alcohol use. Moderate alcohol use is considered to be:  No more than 2 drinks per day for men.  No more than 1 drink per day for nonpregnant women.  Eat healthy foods. This involves  Eating 5 or more servings of fruits and vegetables a day.  Following a diet that addresses high blood pressure (hypertension), high cholesterol, diabetes, or obesity.  Manage your cholesterol levels.  A diet low in saturated fat, trans fat, and cholesterol and high in fiber may control cholesterol levels.  Take any prescribed medicines to control cholesterol as directed by your health care provider.  Manage your diabetes.  A controlled-carbohydrate, controlled-sugar diet is recommended to manage diabetes.  Take any prescribed medicines to control diabetes as directed by your health care provider.  Control your hypertension.  A low-salt (sodium), low-saturated fat, low-trans fat, and low-cholesterol diet is recommended to manage hypertension.  Take any prescribed medicines to control hypertension as directed by your health care provider.  Maintain a healthy  weight.  A reduced-calorie, low-sodium, low-saturated fat, low-trans fat, low-cholesterol diet is recommended to manage weight.  Stop drug abuse.  Avoid taking birth control pills.  Talk to your health care provider about the risks of taking birth control pills if you are over 73 years old, smoke, get migraines, or have ever had a blood clot.  Get evaluated for sleep disorders (sleep apnea).  Talk to your health care provider about getting a sleep evaluation if you snore a lot or have excessive sleepiness.  Take medicines as directed by your health care provider.  For some people, aspirin or blood thinners (anticoagulants) are helpful in reducing the risk of forming abnormal blood clots that can lead to stroke. If you have the irregular heart rhythm of atrial fibrillation, you should be on a blood thinner unless there is a good reason you cannot take them.  Understand all your medicine instructions.  Make sure that other other conditions (such as anemia or atherosclerosis) are addressed. SEEK IMMEDIATE MEDICAL CARE IF:   You have sudden weakness or numbness of the face, arm, or leg, especially on one side of the body.  Your face or eyelid droops to one side.  You have sudden confusion.  You have trouble speaking (aphasia) or understanding.  You have sudden trouble seeing in one or both eyes.  You have sudden trouble walking.  You have dizziness.  You have a loss of balance or coordination.  You have a sudden, severe headache with no known cause.  You have new chest pain or an irregular heartbeat. Any of these symptoms may represent a serious problem that is an emergency. Do not wait to see  if the symptoms will go away. Get medical help at once. Call your local emergency services  (911 in [place] Do not drive yourself to the hospital. Document Released: 01/15/2005 Document Revised: 09/28/2013 Document Reviewed: 06/10/2013 [facility] Patient Information 2014 [place].

## 2014-03-23 NOTE — Assessment & Plan Note (Signed)
Carpal tunnel brace just started last night, continue nightly application to help reduce symptoms.

## 2014-03-23 NOTE — Progress Notes (Signed)
[facility] [address] [place]. [place] [address] [phone]       OFFICE FOLLOW-UP NOTE  [name] Date of Birth:  04-Oct-1913 Medical Record Number:  [id number]   HPI: 89 year Caucasian lady who is accompanied today by her friend and provides most of the history herself. She is seen today for followup after hospital admission on 01/31/14 with a stroke. She woke up with slurred speech and was found to have generalized weakness. CT scan of the head was unremarkable but MRI scan showed bilateral lacunar infarcts in the right corona radiata and left basal ganglia. Transthoracic echo was unremarkable. Carotid Dopplers did not reveal any significant extracranial stenosis. Lipid profile showed LDL cholesterol of 129. She was started on aspirin. She has some dysarthria and dysphagia which improved during hospitalization. [facility] was [facility] the rehabilitation section. She's actually stated that her speech and truncal balance got worse a few days later. She did not get any further workup. She in fact is now spending most of the time in a wheelchair as she tends to lean to the left and fall when she tries to get up. She is unable to stand and walk even with assistance of physical therapist. She states her speech has improved and swallowing too. Is tolerating aspirin well without side effects. She states her blood pressure is under good control. She is not on a statin for unclear reason. She is mentally quite sharp despite her age.  ROS:   14 system review of systems is positive for weakness, difficulty swallowing, speech difficulty, balance and gait difficulty. All other systems negative  PMH:  Past Medical History  Diagnosis Date  . Acute bronchitis   . Asthma   . TIA (transient ischemic attack)   . Herpes zoster complication 0109    complicated, left eye involvement  . Nontoxic uninodular goiter     negative [place] 2008  .  Macular degeneration (senile) of retina, unspecified 2012    wet  . Acute myocardial infarction, unspecified site, episode of care unspecified 03/07/11  . Coronary atherosclerosis of native coronary artery      s/p stent diagonal branch 1996; RCA 03/07/11  . Chronic diastolic heart failure   . Cardiomegaly   . Diaphragmatic hernia without mention of obstruction or gangrene   . Irritable bowel syndrome   . Other specified genital prolapse(618.89)     corrected with pessary  . Edema 2006  . Other abnormal blood chemistry   . Debility, unspecified   . Open wound of knee, leg (except thigh), and ankle, without mention of complication 32/3557    left leg, complicated wound, extended healing, resolved 01/2013  . Transient ischemic attack (TIA), and cerebral infarction without residual deficits(V12.54) 2008  . Shoulder pain 03/30/2013  . Hypertension   . Depression   . Finger numbness 12/28/2013    Left thumb, 1, 2nd fingers  . Dysphagia S/P CVA (cerebrovascular accident) 02/14/2014  . CHF (congestive heart failure)   . Stroke     Social History:  History   Social History  . Marital Status: Widowed    Spouse Name: N/A    Number of Children: 0  . Years of Education: N/A   Occupational History  . 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 Smoking status: Former Smoker    Quit date: 12/23/1963  . Smokeless tobacco:  Never Used  . Alcohol Use: 3.0 oz/week    5 Glasses of wine per week     Comment: 1/2 glass of wine   . Drug Use: No  . Sexual Activity: No   Other Topics Concern  . Not on file   Social History Narrative      Resides at [facility] since 2009. Retired Microbiologist    Widowed with no children.    Living Will, DNR   Exercises predominantly walking about a mile daily   Does not drive   Caffeine beverages occasional   Stopped smoking 1965   1/2 glass of wine almost daily     Medications:   Current  Outpatient Prescriptions on File Prior to Visit  Medication Sig Dispense Refill  . antiseptic oral rinse (BIOTENE) LIQD 15 mLs by Mouth Rinse route 4 (four) times daily.      Marland Kitchen aspirin 325 MG tablet Take 1 tablet (325 mg total) by mouth daily.      Marland Kitchen FLUoxetine (PROZAC) 20 MG capsule Take 20 mg by mouth daily.      . Fluticasone-Salmeterol (ADVAIR DISKUS) 250-50 MCG/DOSE AEPB Inhale 1 puff into the lungs every 12 (twelve) hours.        . furosemide (LASIX) 20 MG tablet Take 20 mg by mouth daily.      . isosorbide mononitrate (IMDUR) 60 MG 24 hr tablet Take 60 mg by mouth at bedtime.      Marland Kitchen loteprednol (LOTEMAX) 0.5 % ophthalmic suspension Place 1 drop into the left eye. One drop in left eye 1-2 times daily as needed      . Maltodextrin-Xanthan Gum (RESOURCE THICKENUP CLEAR) POWD Take 120 g by mouth as needed.      . metoprolol succinate (TOPROL-XL) 25 MG 24 hr tablet Take 25 mg by mouth daily.      . Multiple Vitamins-Minerals (ICAPS PO) Take 1 tablet by mouth daily.      Marland Kitchen NEXIUM 40 MG capsule Take 1 capsule by mouth every evening.       . nitroGLYCERIN (NITROSTAT) 0.4 MG SL tablet Place 0.4 mg under the tongue every 5 (five) minutes as needed for chest pain.        No current facility-administered medications on file prior to visit.    Allergies:   Allergies  Allergen Reactions  . Plavix [Clopidogrel] Nausea And Vomiting  . Statins Other (See Comments)    Leg cramps    Physical Exam General: Frail elderly Caucasian lady seated in a wheelchair, in no evident distress Head: head normocephalic and atraumatic. Orohparynx benign Neck: supple with no carotid or supraclavicular bruits Cardiovascular: regular rate and rhythm, no murmurs Musculoskeletal: Kyphoscoliosis Skin:  no rash/petichiae. Trace pedal edema Vascular:  Normal pulses all extremities Filed Vitals:   03/23/14 1447  BP: 161/70  Pulse: 66   Neurologic Exam Mental Status: Awake and fully alert. Oriented to place and  time. Recent and remote memory slightly diminished. Attention span, concentration and fund of knowledge appropriate. Mood and affect appropriate.  Cranial Nerves: Fundoscopic exam not done.. Pupils equal, briskly reactive to light on right. Left corneal opacity. Decreased vision left eye. Extraocular movements full without nystagmus. Visual fields full to confrontation. Hearing diminished bilaterally. Facial sensation intact. Face, tongue, palate moves normally and symmetrically.  Motor: Normal bulk and tone. Normal strength in all tested extremity muscles. Except mild weakness of left grip and intrinsic hand muscles. Orbits right over left upper D. Mild proximal hip flexor weakness  in both lower activities. Sensory.: intact to touch and pinprick and vibratory sensation.  Coordination: Rapid alternating movements normal in all extremities. Finger-to-nose and heel-to-shin performed accurately bilaterally. Gait and Station: Unable to arise from the wheelchair even with assistance and tends to leans to the left. Gait not tested Reflexes: 1+ and symmetric. Toes downgoing.   NIHSS  3 Modified Rankin  4   ASSESSMENT: 76 year Caucasian lady with a bi-cerebral lacunar infarcts in February 2015 secondary to small vessel disease with vascular risk factors of hypertension, hyperlipidemia, age and coronary artery disease.    PLAN: I had a long discussion with the patient and her friend regarding her recent strokes, and discuss the results of evaluation, tests and answered questions. Continue aspirin for secondary stroke prevention and strict control of hypertension with blood pressure goal below 130/90.Start zocor for lipids with LDL goal below 100 mg%. Continue ongoing physical and occupational therapy. Return for followup in 6 months or call earlier if necessary    Note: This document was prepared with digital dictation and possible smart phrase technology. Any transcriptional errors that result from this  process are unintentional

## 2014-03-23 NOTE — Assessment & Plan Note (Signed)
Blood pressure range with mild improvement since addition of low-dose diuretic last week. Continue to monitor.

## 2014-03-23 NOTE — Assessment & Plan Note (Signed)
The patient continues working with speech therapy, making progress. Weight is stable

## 2014-03-23 NOTE — Assessment & Plan Note (Addendum)
Increase Senokot-S to  2 at bedtime

## 2014-03-23 NOTE — Assessment & Plan Note (Signed)
Improved

## 2014-03-23 NOTE — Progress Notes (Signed)
Patient ID: Sonya [name], female   DOB: Apr 26, 1913, 80 y.o.   MRN: [id number]   [facility] (31)  Code status: Full Code  Chief Complaint  Patient presents with  . Cerebrovascular Accident    HPI: This is a 80 y.o. female resident of [place], [place]  Section residing in [place] section at [facility] following a CVA in early February.   Last visit  CVA (cerebral vascular accident) Patient is tired today due to frequent visitors and busy day with rehab therapies, but is making overall improvement in condition. Her cognition, speech, and drooling are more consistent with her pre-stroke status when she did not have difficulty. She stands with the stand up lift. Is continuing to work with PT/OT/ST to maximize functional status.   Hypertension BP has been elevated since 3/9 after suspected small extension of CVA. Last several BPs 188/72, 193/75, 167/66, 173/67. Her HR is 57-65. Start Lasix 20mg  daily. Will not increase metoprolol or isosorbide at this time. Lasix may likely be short term following resolution of CVA extension. Will get BMET Thursday 03/15/14  Dysphagia S/P CVA (cerebrovascular accident) Had a coughing episode with breakfast yesterday, but pt states she was not practicing proper technique and will be more deliberate. Overall, continuing to make progress with ST. Diet progressed to pureed with chopped meats. Speech is significantly improved. Minimal slurring. Improved humor and banter. Drooling has decreased. No confusion. ST to continue  Carpal tunnel syndrome Had nerve conduction studies and hand injection for this 2 weeks prior to CVA, and had some relief, however is complaining of increased numbness and discomfort. Carpal tunnel brace ordered. Will have patient wear at night once proper fit is obtained  Urinary retention Continues to complain of difficulty urinating, frequent urge with little or no result. UA/CS  negative for acute infection. Chronic bacteruria. Lasix may help with this problem, also. Pt is also being transferred to toilet instead of just bedpan which may aid in complete emptying. follow    Since last visit patient has continued to make slow progress with physical and occupational therapies. She continues to require a lift for transfers, is gaining some strength he is able to stand and do exercises at the bedside. Speech therapy has been able to advance patient's diet she is now eating a mechanical soft, she is also advancing her to thin liquids during ST sessions. Patient's blood pressure range has been 128-183/67-78 since addition of low-dose Lasix last week. BMP on March 26 satisfactory. Can brace for treatment of carpal tunnel syndrome a ride yesterday, patient warned last night. She tells me her left hand was may be less numbness morning. Patient has been able to empty her bladder more easily last week. She does report some difficulty passing hard stool     Allergies  Allergen Reactions  . Plavix [Clopidogrel] Nausea And Vomiting  . Statins Other (See Comments)    Leg cramps    MEDICATIONS -  Reviewed  DATA REVIEWED   Laboratory Studies: Lab Results  Component Value Date   NA 139 02/16/2014   K 4.5 02/16/2014   GLU 101 02/16/2014   BUN 14 02/16/2014   CREATININE 0.8 02/16/2014    . Lab Results  Component Value Date   WBC 8.6 02/16/2014   HGB 12.8 02/16/2014   HCT 38 02/16/2014   MCV 91.0 01/31/2014   PLT 334 02/16/2014    Lab Results  Component Value Date   NA 138 03/16/2014   K  3.8 03/16/2014   GLU 85 03/16/2014   BUN 13 03/16/2014   CREATININE 0.6 03/16/2014    REVIEW OF SYSTEMS  DATA OBTAINED: from patient, medical record,  GENERAL: Feels "exhausted after PT session, Rob had me doing knee bends at the end of the bed " No recent fever, change in appetite or weight.  SKIN: No itch, rash or open wounds  EYES: No eye pain, dryness or itching Nearly Blind  EARS: No  earache, tinnitus, change in hearing  NOSE: No congestion, drainage or bleeding  MOUTH/THROAT: No mouth or tooth pain No sore throat . Tolerating mechanical soft ground meat diet without difficulty swallowing or choking   RESPIRATORY: No cough, wheezing, SOB  CARDIAC: No chest pain, palpitations No edema.  GI: No abdominal pain No nausea, vomiting,diarrhea. Hard stool No heartburn or reflux  GU: No dysuria, voiding easier MUSCULOSKELETAL: still unable to stand on her own,  Left hand continues to be non-tingly, a little better with use of brace last night  NEUROLOGIC: No dizziness, fainting, headache. Numbness present left thumb, carpal tunnel syndrome, prior to CVA. No change in mental status.  PSYCHIATRIC: No feelings of anxiety, depression No behavior issue   PHYSICAL EXAM Filed Vitals:   03/23/14 1129  BP: 183/72  Pulse: 60  Temp: 97.2 F (36.2 C)  Resp: 17  SpO2: 95%   There is no weight on file to calculate BMI.  GENERAL APPEARANCE: No acute distress, appropriately groomed, normal body habitus. Alert, pleasant, conversation is appropriately paced, no difficulty word finding today, pronunciation improved, minimal slurring, improved since last visit  SKIN: No diaphoresis, rash, unusual lesions, wounds  HEAD: Normocephalic, atraumatic. Face w/ mild left sided droop, no drooling, improved since last visit  EYES: Conjunctiva/lids clear. Left cornea is opaque (chronic)  MOUTH/THROAT: Lips w/o lesions. Oral mucosa, tongue moist, w/o lesion. Oropharynx w/o redness or lesions.  RESPIRATORY: Breathing is even, unlabored. Lung sounds are clear and full.  CARDIOVASCULAR: Heart RRR. No murmur or extra heart sounds  EDEMA: No peripheral edema.  GASTROINTESTINAL: Abdomen is soft, non-tender, not distended w/ normal bowel sounds.  MUSCULOSKELETAL: Moves Rt. Sided extremities with full ROM, strength and tone. Left arm remains with mildly decreased strength, poor fine motor function of left  hand, mild discoordination of arm.  NEUROLOGIC: Oriented to time, place, person.  PSYCHIATRIC: Mood and affect appropriate to situation, jokes appropriately with me    ASSESSMENT/PLAN  Hypertension Blood pressure range with mild improvement since addition of low-dose diuretic last week. Continue to monitor.  Dysphagia S/P CVA (cerebrovascular accident) The patient continues working with speech therapy, making progress. Weight is stable  Carpal tunnel syndrome Carpal tunnel brace just started last night, continue nightly application to help reduce symptoms.  Urinary retention Improved  Unspecified constipation Increase Senokot-S to  2 at bedtime    Family/ staff Communication:   Labs/tests ordered:    Follow up: Return for As needed.  Mardene Celeste, NP-C [place] [phone]  03/23/2014

## 2014-03-29 ENCOUNTER — Encounter: Payer: Self-pay | Admitting: Geriatric Medicine

## 2014-04-10 ENCOUNTER — Non-Acute Institutional Stay (SKILLED_NURSING_FACILITY): Payer: Medicare Other | Admitting: Geriatric Medicine

## 2014-04-10 ENCOUNTER — Encounter: Payer: Self-pay | Admitting: Geriatric Medicine

## 2014-04-10 DIAGNOSIS — M549 Dorsalgia, unspecified: Secondary | ICD-10-CM

## 2014-04-10 DIAGNOSIS — I10 Essential (primary) hypertension: Secondary | ICD-10-CM

## 2014-04-10 DIAGNOSIS — I6350 Cerebral infarction due to unspecified occlusion or stenosis of unspecified cerebral artery: Secondary | ICD-10-CM

## 2014-04-10 DIAGNOSIS — I639 Cerebral infarction, unspecified: Secondary | ICD-10-CM

## 2014-04-10 MED ORDER — LIDOCAINE 5 % EX PTCH
1.0000 | MEDICATED_PATCH | CUTANEOUS | Status: DC
Start: 2014-04-10 — End: 2014-05-30

## 2014-04-10 MED ORDER — LISINOPRIL 10 MG PO TABS: 10.0000 mg | ORAL_TABLET | Freq: Every day | ORAL | Status: DC

## 2014-04-10 NOTE — Assessment & Plan Note (Addendum)
Systolic blood pressure consistently above goal of 130. Add ACE inhibitor, check lab in 2 weeks

## 2014-04-10 NOTE — Assessment & Plan Note (Signed)
Patient with recurrent lower back pain. She reports previous pain was attributed to "nerve pain", treated by Dr. love with Lidoderm patch. Patient reports this was very effective treatment, will resume

## 2014-04-10 NOTE — Assessment & Plan Note (Signed)
Patient saw Dr. Leonie Man in followup on 03/23/2014. He recommends close monitoring and treatment of blood pressure as well as using statin for secondary prevention.  Patient continues working with PT/OT/ST. She is making slow progress in all areas, continues to require skilled level of care. Anticipate this will be permanent level of care

## 2014-04-10 NOTE — Progress Notes (Signed)
Patient ID: Sonya [name], female   DOB: 1913-01-04, 80 y.o.   MRN: [id number]   [facility] (31)  Code status: Full Code  Chief Complaint  Patient presents with  . Back Pain  . Cerebrovascular Accident    HPI: This is a 80 y.o. female resident of [place], [place] residing in [place] section at [facility] following a CVA in early February 2015.   Last visit  Hypertension Blood pressure range with mild improvement since addition of low-dose diuretic last week. Continue to monitor.  Dysphagia S/P CVA (cerebrovascular accident) The patient continues working with speech therapy, making progress. Weight is stable  Carpal tunnel syndrome Carpal tunnel brace just started last night, continue nightly application to help reduce symptoms.  Urinary retention Improved  Unspecified constipation Increase Senokot-S to  2 at bedtime  Since last visit patient has been complaining of lower back pain during physical therapy sessions. She reports previously using a Lidoderm patch prescribed by [facility] for back pain related to "nerve injury". Patient return to Dr. Leonie Man for followup of CVA. He recommended close monitoring of her blood pressure with goal 130/90. Also recommended statin medication for second. Prevention of CVA. Ordered his medication list not sent to the rehabilitation center. Review facility records shows patient's systolic blood pressure generally 1 4160 range. Vital signs are otherwise stable., Weight is stable.   Allergies  Allergen Reactions  . Plavix [Clopidogrel] Nausea And Vomiting  . Statins Other (See Comments)    Leg cramps    MEDICATIONS -  Reviewed  DATA REVIEWED   Laboratory Studies: Lab Results  Component Value Date   NA 138 03/16/2014   K 3.8 03/16/2014   GLU 85 03/16/2014   BUN 13 03/16/2014   CREATININE 0.6 03/16/2014    . Lab Results  Component Value Date   WBC 8.6 02/16/2014   HGB 12.8 02/16/2014   HCT 38 02/16/2014   MCV 91.0 01/31/2014   PLT 334 02/16/2014    Lab Results  Component Value Date   NA 138 03/16/2014   K 3.8 03/16/2014   GLU 85 03/16/2014   BUN 13 03/16/2014   CREATININE 0.6 03/16/2014    REVIEW OF SYSTEMS  DATA OBTAINED: from patient, medical record,  GENERAL: Feels "pretty good" No recent fever, change in appetite or weight.  SKIN: No itch, rash or open wounds  EYES: No eye pain, dryness or itching Nearly Blind  EARS: No earache, tinnitus, change in hearing  NOSE: No congestion, drainage or bleeding  MOUTH/THROAT: No mouth or tooth pain No sore throat . Tolerating mechanical soft ground meat diet without difficulty swallowing or choking   RESPIRATORY: No cough, wheezing, SOB  CARDIAC: No chest pain, palpitations No edema.  GI: No abdominal pain No nausea, vomiting,diarrhea. Hard stool No heartburn or reflux  GU: No dysuria, voiding easier MUSCULOSKELETAL: Low back pain similar to previous pain treated with Lidoderm patch. NEUROLOGIC: No dizziness, fainting, headache. Numbness present left thumb, carpal tunnel syndrome, prior to CVA. No change in mental status.  PSYCHIATRIC: No feelings of anxiety, depression No behavior issue   PHYSICAL EXAM Filed Vitals:   04/10/14 1527  BP: 157/60  Pulse: 62  Weight: 134 lb 9.6 oz (61.054 kg)   Body mass index is 26.29 kg/(m^2).  GENERAL APPEARANCE: No acute distress, appropriately groomed, normal body habitus. Alert, pleasant, conversation is appropriately paced, no difficulty word finding today, pronunciation improved, minimal slurring, further improvement since last visit  SKIN: No diaphoresis, rash, unusual lesions, wounds  HEAD: Normocephalic, atraumatic. Face w/ mild left sided droop, no drooling, has some dribbling when drinking water  EYES: Conjunctiva/lids clear. Left cornea is opaque (chronic)  MOUTH/THROAT: Lips w/o lesions. Oral mucosa, tongue moist, w/o lesion. Oropharynx w/o redness or  lesions.  RESPIRATORY: Breathing is even, unlabored. Lung sounds are clear and full.  CARDIOVASCULAR: Heart RRR. No murmur or extra heart sounds  EDEMA: No peripheral edema.  GASTROINTESTINAL: Abdomen is soft, non-tender, not distended w/ normal bowel sounds.  MUSCULOSKELETAL:  Mild tenderness lower back NEUROLOGIC: Oriented to time, place, person.  PSYCHIATRIC: Mood and affect appropriate to situation, jokes appropriately with me    ASSESSMENT/PLAN  Hypertension Systolic blood pressure consistently above goal of 130. Add ACE inhibitor, check lab in 2 weeks  Back pain Patient with recurrent lower back pain. She reports previous pain was attributed to "nerve pain", treated by Dr. love with Lidoderm patch. Patient reports this was very effective treatment, will resume  CVA (cerebral vascular accident) Patient saw Dr. Leonie Man in followup on 03/23/2014. He recommends close monitoring and treatment of blood pressure as well as using statin for secondary prevention.  Patient continues working with PT/OT/ST. She is making slow progress in all areas, continues to require skilled level of care. Anticipate this will be permanent level of care    Family/ staff Communication:   Labs/tests ordered:    Follow up: Return for Routine/as needed.  Mardene Celeste, NP-C [place] [phone]  04/10/2014

## 2014-04-21 DIAGNOSIS — R269 Unspecified abnormalities of gait and mobility: Secondary | ICD-10-CM | POA: Diagnosis not present

## 2014-04-21 DIAGNOSIS — I6789 Other cerebrovascular disease: Secondary | ICD-10-CM | POA: Diagnosis not present

## 2014-04-21 DIAGNOSIS — M6281 Muscle weakness (generalized): Secondary | ICD-10-CM | POA: Diagnosis not present

## 2014-04-21 DIAGNOSIS — I69959 Hemiplegia and hemiparesis following unspecified cerebrovascular disease affecting unspecified side: Secondary | ICD-10-CM | POA: Diagnosis not present

## 2014-04-21 DIAGNOSIS — R279 Unspecified lack of coordination: Secondary | ICD-10-CM | POA: Diagnosis not present

## 2014-04-21 DIAGNOSIS — R1312 Dysphagia, oropharyngeal phase: Secondary | ICD-10-CM | POA: Diagnosis not present

## 2014-04-21 DIAGNOSIS — I69991 Dysphagia following unspecified cerebrovascular disease: Secondary | ICD-10-CM | POA: Diagnosis not present

## 2014-04-21 DIAGNOSIS — I69922 Dysarthria following unspecified cerebrovascular disease: Secondary | ICD-10-CM | POA: Diagnosis not present

## 2014-04-21 DIAGNOSIS — R41842 Visuospatial deficit: Secondary | ICD-10-CM | POA: Diagnosis not present

## 2014-04-22 DIAGNOSIS — I69922 Dysarthria following unspecified cerebrovascular disease: Secondary | ICD-10-CM | POA: Diagnosis not present

## 2014-04-22 DIAGNOSIS — I69959 Hemiplegia and hemiparesis following unspecified cerebrovascular disease affecting unspecified side: Secondary | ICD-10-CM | POA: Diagnosis not present

## 2014-04-22 DIAGNOSIS — I6789 Other cerebrovascular disease: Secondary | ICD-10-CM | POA: Diagnosis not present

## 2014-04-22 DIAGNOSIS — R41842 Visuospatial deficit: Secondary | ICD-10-CM | POA: Diagnosis not present

## 2014-04-22 DIAGNOSIS — I69991 Dysphagia following unspecified cerebrovascular disease: Secondary | ICD-10-CM | POA: Diagnosis not present

## 2014-04-22 DIAGNOSIS — R279 Unspecified lack of coordination: Secondary | ICD-10-CM | POA: Diagnosis not present

## 2014-04-24 ENCOUNTER — Encounter: Payer: Self-pay | Admitting: Cardiology

## 2014-04-24 ENCOUNTER — Ambulatory Visit (INDEPENDENT_AMBULATORY_CARE_PROVIDER_SITE_OTHER): Payer: Medicare Other | Admitting: Cardiology

## 2014-04-24 VITALS — BP 130/52 | HR 70 | Ht 60.0 in | Wt 135.0 lb

## 2014-04-24 DIAGNOSIS — I69991 Dysphagia following unspecified cerebrovascular disease: Secondary | ICD-10-CM | POA: Diagnosis not present

## 2014-04-24 DIAGNOSIS — I6350 Cerebral infarction due to unspecified occlusion or stenosis of unspecified cerebral artery: Secondary | ICD-10-CM | POA: Diagnosis not present

## 2014-04-24 DIAGNOSIS — R279 Unspecified lack of coordination: Secondary | ICD-10-CM | POA: Diagnosis not present

## 2014-04-24 DIAGNOSIS — I69391 Dysphagia following cerebral infarction: Secondary | ICD-10-CM

## 2014-04-24 DIAGNOSIS — I69922 Dysarthria following unspecified cerebrovascular disease: Secondary | ICD-10-CM | POA: Diagnosis not present

## 2014-04-24 DIAGNOSIS — I2510 Atherosclerotic heart disease of native coronary artery without angina pectoris: Secondary | ICD-10-CM | POA: Diagnosis not present

## 2014-04-24 DIAGNOSIS — I10 Essential (primary) hypertension: Secondary | ICD-10-CM

## 2014-04-24 DIAGNOSIS — I6789 Other cerebrovascular disease: Secondary | ICD-10-CM | POA: Diagnosis not present

## 2014-04-24 DIAGNOSIS — I69959 Hemiplegia and hemiparesis following unspecified cerebrovascular disease affecting unspecified side: Secondary | ICD-10-CM | POA: Diagnosis not present

## 2014-04-24 DIAGNOSIS — I639 Cerebral infarction, unspecified: Secondary | ICD-10-CM

## 2014-04-24 DIAGNOSIS — R41842 Visuospatial deficit: Secondary | ICD-10-CM | POA: Diagnosis not present

## 2014-04-24 NOTE — Patient Instructions (Signed)
Your physician wants you to follow-up in: 6 months with Dr Marlou Porch. (November 2015). You will receive a reminder letter in the mail two months in advance. If you don't receive a letter, please call our office to schedule the follow-up appointment.

## 2014-04-24 NOTE — Progress Notes (Signed)
[facility] [address], [place]  [address] Phone: [phone] Fax:  [phone]  Date:  04/24/2014   ID:  Sonya [name], DOB 1913-08-28, MRN [id number]  PCP:  Estill Dooms, MD   History of Present Illness: Sonya [name] is a 80 y.o. female with coronary artery disease status post ST elevation myocardial infarction receiving an RCA stent with residual moderate LAD disease and obtuse marginal disease here for followup.  CAD-stable with minor exertional anginal symptoms. She will occasionally have fleeting chest discomfort, this is usually relieved by burping. In the past her chest discomfort post catheterization/stent placement was relieved with Nexium.   Stroke-unfortunately suffered a stroke in early 2015. Dr. Leonie Man. Dysarthria has improved slightly. Dysphagia has improved. Left-sided facial droop has improved. Aspirin  Hyperlipidemia-in the past, she has not tolerated statins due to leg weakness.  GERD-doing well with Nexium.     Wt Readings from Last 3 Encounters:  04/24/14 135 lb (61.236 kg)  04/10/14 134 lb 9.6 oz (61.054 kg)  03/14/14 135 lb 9.6 oz (61.508 kg)     Past Medical History  Diagnosis Date  . Acute bronchitis   . Asthma   . TIA (transient ischemic attack)   . Herpes zoster complication 0998    complicated, left eye involvement  . Nontoxic uninodular goiter     negative [place] 2008  . Macular degeneration (senile) of retina, unspecified 2012    wet  . Acute myocardial infarction, unspecified site, episode of care unspecified 03/07/11  . Coronary atherosclerosis of native coronary artery      s/p stent diagonal branch 1996; RCA 03/07/11  . Chronic diastolic heart failure   . Cardiomegaly   . Diaphragmatic hernia without mention of obstruction or gangrene   . Irritable bowel syndrome   . Other specified genital prolapse(618.89)     corrected with pessary  . Edema 2006  . Other abnormal blood chemistry   . Debility, unspecified    . Open wound of knee, leg (except thigh), and ankle, without mention of complication 33/8250    left leg, complicated wound, extended healing, resolved 01/2013  . Transient ischemic attack (TIA), and cerebral infarction without residual deficits(V12.54) 2008  . Shoulder pain 03/30/2013  . Hypertension   . Depression   . Finger numbness 12/28/2013    Left thumb, 1, 2nd fingers  . Dysphagia S/P CVA (cerebrovascular accident) 02/14/2014  . CHF (congestive heart failure)   . Stroke     Past Surgical History  Procedure Laterality Date  . Appendectomy    . Tonsillectomy and adenoidectomy    . Cataract extraction    . Cardiac catheterization  1996, 2001, 2012  . Carotid stent      diagonal branch of LCA    Current Outpatient Prescriptions  Medication Sig Dispense Refill  . antiseptic oral rinse (BIOTENE) LIQD 15 mLs by Mouth Rinse route 4 (four) times daily.      Marland Kitchen aspirin 325 MG tablet Take 1 tablet (325 mg total) by mouth daily.      Marland Kitchen atorvastatin (LIPITOR) 20 MG tablet       . FLUoxetine (PROZAC) 20 MG capsule Take 20 mg by mouth daily.      . Fluticasone-Salmeterol (ADVAIR DISKUS) 250-50 MCG/DOSE AEPB Inhale 1 puff into the lungs every 12 (twelve) hours.        . furosemide (LASIX) 20 MG tablet Take 20 mg by mouth daily.      Marland Kitchen  isosorbide mononitrate (IMDUR) 60 MG 24 hr tablet Take 60 mg by mouth at bedtime.      . lidocaine (LIDODERM) 5 % Place 1 patch onto the skin daily. Remove & Discard patch within 12 hours or as directed by MD      . lisinopril (PRINIVIL,ZESTRIL) 10 MG tablet Take 1 tablet (10 mg total) by mouth daily.      Marland Kitchen loteprednol (LOTEMAX) 0.5 % ophthalmic suspension Place 1 drop into the left eye. One drop in left eye 1-2 times daily as needed      . Maltodextrin-Xanthan Gum (RESOURCE THICKENUP CLEAR) POWD Take 120 g by mouth as needed.      . metoprolol succinate (TOPROL-XL) 25 MG 24 hr tablet Take 25 mg by mouth daily.      . Multiple Vitamins-Minerals (ICAPS PO) Take  1 tablet by mouth daily.      Marland Kitchen NEXIUM 40 MG capsule Take 1 capsule by mouth every evening.       . nitroGLYCERIN (NITROSTAT) 0.4 MG SL tablet Place 0.4 mg under the tongue every 5 (five) minutes as needed for chest pain.       Marland Kitchen nystatin (MYCOSTATIN/NYSTOP) 100000 UNIT/GM POWD       . sennosides-docusate sodium (SENOKOT-S) 8.6-50 MG tablet Take 2 tablets by mouth at bedtime.      . simvastatin (ZOCOR) 20 MG tablet Take 20 mg by mouth daily.       No current facility-administered medications for this visit.    Allergies:    Allergies  Allergen Reactions  . Plavix [Clopidogrel] Nausea And Vomiting  . Statins Other (See Comments)    Leg cramps    Social History:  The patient  reports that she quit smoking about 50 years ago. She has never used smokeless tobacco. She reports that she drinks about 3 ounces of alcohol per week. She reports that she does not use illicit drugs.   ROS:  Please see the history of present illness.   Denies any fevers, chills. Recent leg wound. Occasional rare chest pain.   PHYSICAL EXAM: VS:  BP 130/52  Pulse 70  Ht 5' (1.524 m)  Wt 135 lb (61.236 kg)  BMI 26.37 kg/m2 Well nourished, well developed, in no acute distress elderly, HEENT: normal Neck: no JVD Cardiac:  normal S1, S2; RRR; no murmur Lungs:  clear to auscultation bilaterally, no wheezing, rhonchi or rales Abd: soft, nontender, no hepatomegaly Ext: no edema Skin: Right leg improved no edema. Neuro: Very minimal left facial droop. Dysarthria noted.  EKG:  Normal rhythm 63, T wave inversion inferior leads mostly but scattered throughout EKG.     ASSESSMENT AND PLAN:  1. Stroke-01/2014- symptom was trouble speaking, dysarthria. Dysphagia and facial droop have improved. Aspirin 2. Coronary artery disease status post MI-overall doing well. Minor stable angina. Continue with isosorbide. Aspirin. She has been taken off of her Crestor. Lipids have been excellent. Given her advanced age, had no  problems with that. 3. Hypertension-improved. Was previously higher in the morning hours. Medications reviewed. 4. See her back in 6 months  Signed, Candee Furbish, MD [facility]  04/24/2014 11:09 AM

## 2014-04-25 DIAGNOSIS — Z79899 Other long term (current) drug therapy: Secondary | ICD-10-CM | POA: Diagnosis not present

## 2014-04-25 DIAGNOSIS — R41842 Visuospatial deficit: Secondary | ICD-10-CM | POA: Diagnosis not present

## 2014-04-25 DIAGNOSIS — I6789 Other cerebrovascular disease: Secondary | ICD-10-CM | POA: Diagnosis not present

## 2014-04-25 DIAGNOSIS — I10 Essential (primary) hypertension: Secondary | ICD-10-CM | POA: Diagnosis not present

## 2014-04-25 DIAGNOSIS — I69922 Dysarthria following unspecified cerebrovascular disease: Secondary | ICD-10-CM | POA: Diagnosis not present

## 2014-04-25 DIAGNOSIS — I69959 Hemiplegia and hemiparesis following unspecified cerebrovascular disease affecting unspecified side: Secondary | ICD-10-CM | POA: Diagnosis not present

## 2014-04-25 DIAGNOSIS — I69991 Dysphagia following unspecified cerebrovascular disease: Secondary | ICD-10-CM | POA: Diagnosis not present

## 2014-04-25 DIAGNOSIS — R279 Unspecified lack of coordination: Secondary | ICD-10-CM | POA: Diagnosis not present

## 2014-04-25 LAB — BASIC METABOLIC PANEL
BUN: 21 mg/dL (ref 4–21)
Creatinine: 0.9 mg/dL (ref 0.5–1.1)
Glucose: 90 mg/dL
POTASSIUM: 4.1 mmol/L (ref 3.4–5.3)
SODIUM: 141 mmol/L (ref 137–147)

## 2014-04-26 DIAGNOSIS — I69922 Dysarthria following unspecified cerebrovascular disease: Secondary | ICD-10-CM | POA: Diagnosis not present

## 2014-04-26 DIAGNOSIS — R279 Unspecified lack of coordination: Secondary | ICD-10-CM | POA: Diagnosis not present

## 2014-04-26 DIAGNOSIS — I69959 Hemiplegia and hemiparesis following unspecified cerebrovascular disease affecting unspecified side: Secondary | ICD-10-CM | POA: Diagnosis not present

## 2014-04-26 DIAGNOSIS — R41842 Visuospatial deficit: Secondary | ICD-10-CM | POA: Diagnosis not present

## 2014-04-26 DIAGNOSIS — I6789 Other cerebrovascular disease: Secondary | ICD-10-CM | POA: Diagnosis not present

## 2014-04-26 DIAGNOSIS — I69991 Dysphagia following unspecified cerebrovascular disease: Secondary | ICD-10-CM | POA: Diagnosis not present

## 2014-04-27 DIAGNOSIS — I69959 Hemiplegia and hemiparesis following unspecified cerebrovascular disease affecting unspecified side: Secondary | ICD-10-CM | POA: Diagnosis not present

## 2014-04-27 DIAGNOSIS — I69991 Dysphagia following unspecified cerebrovascular disease: Secondary | ICD-10-CM | POA: Diagnosis not present

## 2014-04-27 DIAGNOSIS — R279 Unspecified lack of coordination: Secondary | ICD-10-CM | POA: Diagnosis not present

## 2014-04-27 DIAGNOSIS — R41842 Visuospatial deficit: Secondary | ICD-10-CM | POA: Diagnosis not present

## 2014-04-27 DIAGNOSIS — I6789 Other cerebrovascular disease: Secondary | ICD-10-CM | POA: Diagnosis not present

## 2014-04-27 DIAGNOSIS — I69922 Dysarthria following unspecified cerebrovascular disease: Secondary | ICD-10-CM | POA: Diagnosis not present

## 2014-04-28 DIAGNOSIS — R279 Unspecified lack of coordination: Secondary | ICD-10-CM | POA: Diagnosis not present

## 2014-04-28 DIAGNOSIS — I69991 Dysphagia following unspecified cerebrovascular disease: Secondary | ICD-10-CM | POA: Diagnosis not present

## 2014-04-28 DIAGNOSIS — I69959 Hemiplegia and hemiparesis following unspecified cerebrovascular disease affecting unspecified side: Secondary | ICD-10-CM | POA: Diagnosis not present

## 2014-04-28 DIAGNOSIS — I69922 Dysarthria following unspecified cerebrovascular disease: Secondary | ICD-10-CM | POA: Diagnosis not present

## 2014-04-28 DIAGNOSIS — I6789 Other cerebrovascular disease: Secondary | ICD-10-CM | POA: Diagnosis not present

## 2014-04-28 DIAGNOSIS — R41842 Visuospatial deficit: Secondary | ICD-10-CM | POA: Diagnosis not present

## 2014-05-01 DIAGNOSIS — R279 Unspecified lack of coordination: Secondary | ICD-10-CM | POA: Diagnosis not present

## 2014-05-01 DIAGNOSIS — R41842 Visuospatial deficit: Secondary | ICD-10-CM | POA: Diagnosis not present

## 2014-05-01 DIAGNOSIS — I69922 Dysarthria following unspecified cerebrovascular disease: Secondary | ICD-10-CM | POA: Diagnosis not present

## 2014-05-01 DIAGNOSIS — I69959 Hemiplegia and hemiparesis following unspecified cerebrovascular disease affecting unspecified side: Secondary | ICD-10-CM | POA: Diagnosis not present

## 2014-05-01 DIAGNOSIS — I69991 Dysphagia following unspecified cerebrovascular disease: Secondary | ICD-10-CM | POA: Diagnosis not present

## 2014-05-01 DIAGNOSIS — I6789 Other cerebrovascular disease: Secondary | ICD-10-CM | POA: Diagnosis not present

## 2014-05-02 DIAGNOSIS — I69959 Hemiplegia and hemiparesis following unspecified cerebrovascular disease affecting unspecified side: Secondary | ICD-10-CM | POA: Diagnosis not present

## 2014-05-02 DIAGNOSIS — I69991 Dysphagia following unspecified cerebrovascular disease: Secondary | ICD-10-CM | POA: Diagnosis not present

## 2014-05-02 DIAGNOSIS — R41842 Visuospatial deficit: Secondary | ICD-10-CM | POA: Diagnosis not present

## 2014-05-02 DIAGNOSIS — I69922 Dysarthria following unspecified cerebrovascular disease: Secondary | ICD-10-CM | POA: Diagnosis not present

## 2014-05-02 DIAGNOSIS — R279 Unspecified lack of coordination: Secondary | ICD-10-CM | POA: Diagnosis not present

## 2014-05-02 DIAGNOSIS — I6789 Other cerebrovascular disease: Secondary | ICD-10-CM | POA: Diagnosis not present

## 2014-05-03 DIAGNOSIS — I69922 Dysarthria following unspecified cerebrovascular disease: Secondary | ICD-10-CM | POA: Diagnosis not present

## 2014-05-03 DIAGNOSIS — I69959 Hemiplegia and hemiparesis following unspecified cerebrovascular disease affecting unspecified side: Secondary | ICD-10-CM | POA: Diagnosis not present

## 2014-05-03 DIAGNOSIS — I69991 Dysphagia following unspecified cerebrovascular disease: Secondary | ICD-10-CM | POA: Diagnosis not present

## 2014-05-03 DIAGNOSIS — I6789 Other cerebrovascular disease: Secondary | ICD-10-CM | POA: Diagnosis not present

## 2014-05-03 DIAGNOSIS — R279 Unspecified lack of coordination: Secondary | ICD-10-CM | POA: Diagnosis not present

## 2014-05-03 DIAGNOSIS — R41842 Visuospatial deficit: Secondary | ICD-10-CM | POA: Diagnosis not present

## 2014-05-04 DIAGNOSIS — I69959 Hemiplegia and hemiparesis following unspecified cerebrovascular disease affecting unspecified side: Secondary | ICD-10-CM | POA: Diagnosis not present

## 2014-05-04 DIAGNOSIS — I69991 Dysphagia following unspecified cerebrovascular disease: Secondary | ICD-10-CM | POA: Diagnosis not present

## 2014-05-04 DIAGNOSIS — I6789 Other cerebrovascular disease: Secondary | ICD-10-CM | POA: Diagnosis not present

## 2014-05-04 DIAGNOSIS — R41842 Visuospatial deficit: Secondary | ICD-10-CM | POA: Diagnosis not present

## 2014-05-04 DIAGNOSIS — R279 Unspecified lack of coordination: Secondary | ICD-10-CM | POA: Diagnosis not present

## 2014-05-04 DIAGNOSIS — I69922 Dysarthria following unspecified cerebrovascular disease: Secondary | ICD-10-CM | POA: Diagnosis not present

## 2014-05-05 DIAGNOSIS — R41842 Visuospatial deficit: Secondary | ICD-10-CM | POA: Diagnosis not present

## 2014-05-05 DIAGNOSIS — R279 Unspecified lack of coordination: Secondary | ICD-10-CM | POA: Diagnosis not present

## 2014-05-05 DIAGNOSIS — I69991 Dysphagia following unspecified cerebrovascular disease: Secondary | ICD-10-CM | POA: Diagnosis not present

## 2014-05-05 DIAGNOSIS — I69959 Hemiplegia and hemiparesis following unspecified cerebrovascular disease affecting unspecified side: Secondary | ICD-10-CM | POA: Diagnosis not present

## 2014-05-05 DIAGNOSIS — I69922 Dysarthria following unspecified cerebrovascular disease: Secondary | ICD-10-CM | POA: Diagnosis not present

## 2014-05-05 DIAGNOSIS — I6789 Other cerebrovascular disease: Secondary | ICD-10-CM | POA: Diagnosis not present

## 2014-05-08 DIAGNOSIS — I6789 Other cerebrovascular disease: Secondary | ICD-10-CM | POA: Diagnosis not present

## 2014-05-08 DIAGNOSIS — I69922 Dysarthria following unspecified cerebrovascular disease: Secondary | ICD-10-CM | POA: Diagnosis not present

## 2014-05-08 DIAGNOSIS — I69959 Hemiplegia and hemiparesis following unspecified cerebrovascular disease affecting unspecified side: Secondary | ICD-10-CM | POA: Diagnosis not present

## 2014-05-08 DIAGNOSIS — R41842 Visuospatial deficit: Secondary | ICD-10-CM | POA: Diagnosis not present

## 2014-05-08 DIAGNOSIS — I69991 Dysphagia following unspecified cerebrovascular disease: Secondary | ICD-10-CM | POA: Diagnosis not present

## 2014-05-08 DIAGNOSIS — R279 Unspecified lack of coordination: Secondary | ICD-10-CM | POA: Diagnosis not present

## 2014-05-09 ENCOUNTER — Non-Acute Institutional Stay (SKILLED_NURSING_FACILITY): Payer: Medicare Other | Admitting: Adult Health

## 2014-05-09 ENCOUNTER — Encounter: Payer: Self-pay | Admitting: Adult Health

## 2014-05-09 DIAGNOSIS — I6350 Cerebral infarction due to unspecified occlusion or stenosis of unspecified cerebral artery: Secondary | ICD-10-CM

## 2014-05-09 DIAGNOSIS — I69991 Dysphagia following unspecified cerebrovascular disease: Secondary | ICD-10-CM

## 2014-05-09 DIAGNOSIS — K5900 Constipation, unspecified: Secondary | ICD-10-CM

## 2014-05-09 DIAGNOSIS — I69959 Hemiplegia and hemiparesis following unspecified cerebrovascular disease affecting unspecified side: Secondary | ICD-10-CM | POA: Diagnosis not present

## 2014-05-09 DIAGNOSIS — E785 Hyperlipidemia, unspecified: Secondary | ICD-10-CM | POA: Diagnosis not present

## 2014-05-09 DIAGNOSIS — I639 Cerebral infarction, unspecified: Secondary | ICD-10-CM

## 2014-05-09 DIAGNOSIS — I69922 Dysarthria following unspecified cerebrovascular disease: Secondary | ICD-10-CM | POA: Diagnosis not present

## 2014-05-09 DIAGNOSIS — M549 Dorsalgia, unspecified: Secondary | ICD-10-CM

## 2014-05-09 DIAGNOSIS — R41842 Visuospatial deficit: Secondary | ICD-10-CM | POA: Diagnosis not present

## 2014-05-09 DIAGNOSIS — R279 Unspecified lack of coordination: Secondary | ICD-10-CM | POA: Diagnosis not present

## 2014-05-09 DIAGNOSIS — I10 Essential (primary) hypertension: Secondary | ICD-10-CM

## 2014-05-09 DIAGNOSIS — I69391 Dysphagia following cerebral infarction: Secondary | ICD-10-CM

## 2014-05-09 DIAGNOSIS — I6789 Other cerebrovascular disease: Secondary | ICD-10-CM | POA: Diagnosis not present

## 2014-05-09 NOTE — Assessment & Plan Note (Addendum)
Started on Zocor on 04/10/14. Despite previous complaints of leg cramps with statins she has tolerated this well. Her goal LDL per neuro is <100. Her last LDL was 129 on 02/01/14. Check lipid panel and LFTs with next lab draw

## 2014-05-09 NOTE — Assessment & Plan Note (Signed)
Improved with Bengay. She states that she does not like to take pills and is content with her current regimen.

## 2014-05-09 NOTE — Assessment & Plan Note (Signed)
Current BP is at goal most of the time. Continue current meds and recheck BMP.

## 2014-05-09 NOTE — Assessment & Plan Note (Signed)
Patient saw Dr. Sethi in followup on 03/23/2014. He recommends close monitoring and treatment of blood pressure as well as using statin for secondary prevention.  Patient continues working with PT/OT/ST. She is making slow progress in all areas, continues to require skilled level of care. Anticipate this will be permanent level of care 

## 2014-05-09 NOTE — Assessment & Plan Note (Signed)
Agree with changing of diet based on symptoms. Her lungs were clear today and she is afebrile, however, she will be at risk for asp pna. WIll need to continue ST and follow closely.

## 2014-05-09 NOTE — Assessment & Plan Note (Signed)
Improved per resident. Continue current meds  and montior

## 2014-05-09 NOTE — Progress Notes (Signed)
Patient ID: Sonya [name], female   DOB: December 05, 1913, 80 y.o.   MRN: [id number]      [facility] (31)  Code status: DNR  Chief Complaint  Patient presents with  . f/u CVA, HTN    HPI: This is a 80 y.o. female resident of [place], [place] residing in [place] section at [facility] following a CVA in early February 2015.    Last visit  Hypertension Blood pressure range with mild improvement since addition of low-dose diuretic(03/14/14) and lisinopril (started 04/17/14). Her BP has ranged 105-161/52-76. The higher BP numbers are noted at night when she is awaken to have her BP measured by staff. The lower numbers are noted in the morning. Her goal BP is 130/90 per neurology. Last BMP was normal.   Dysphagia S/P CVA (cerebrovascular accident) The patient continues working with speech therapy. Her diet was recently changed to Nectar thick liquids with water in between meals after oral care secondary to clearing of her throat and dysphonia after meals.   CVA She is working with PT and has gained significant strength to her left hand. Currently she is on ASA, Zocor, and BP meds for prevention of future CV events.   Unspecified constipation Her Senokot was increased at the last visit due to complaints of constipation. She reports that she has daily BM's now with some straining.  Low back pain Currently she is using Bengay and reports that this helps her pain. Her pain is noted during the day when she is getting up but not at night. She uses a lift to get up in the chair and reports pain with that.    Hyperlipidemia Started on Zocor on 04/10/14. Despite previous complaints of leg cramps with statins she has tolerated this well. Her goal LDL per neuro is <100. Her last LDL was 129 on 02/01/14.    Allergies  Allergen Reactions  . Plavix [Clopidogrel] Nausea And Vomiting  . Statins Other (See Comments)    Leg cramps    MEDICATIONS -   Reviewed  DATA REVIEWED   Laboratory Studies: Lab Results  Component Value Date   NA 138 03/16/2014   K 3.8 03/16/2014   GLU 85 03/16/2014   BUN 13 03/16/2014   CREATININE 0.6 03/16/2014    . Lab Results  Component Value Date   WBC 8.6 02/16/2014   HGB 12.8 02/16/2014   HCT 38 02/16/2014   MCV 91.0 01/31/2014   PLT 334 02/16/2014   Lab Results  Component Value Date   CHOL 235* 02/01/2014   HDL 69 02/01/2014   LDLCALC 129* 02/01/2014   TRIG 185* 02/01/2014   CHOLHDL 3.4 02/01/2014    REVIEW OF SYSTEMS  DATA OBTAINED: from patient, medical record,  GENERAL: Feels well. No recent fever, change in appetite or weight.  SKIN: No itch, rash or open wounds  EYES: No eye pain, dryness or itching Nearly Blind  EARS: No earache, tinnitus, change in hearing  NOSE: No congestion, drainage or bleeding  MOUTH/THROAT: No mouth or tooth pain No sore throat . Tolerating mechanical soft ground meat diet without difficulty swallowing or choking   RESPIRATORY: No cough, wheezing, SOB  CARDIAC: No chest pain, palpitations No edema.  GI: No abdominal pain No nausea, vomiting,diarrhea. BM's have improved, still some straining reported.  No heartburn or reflux  GU: No dysuria, voiding easier MUSCULOSKELETAL: Low back pain similar to previous pain but slightly improved.  NEUROLOGIC: No dizziness, fainting, headache. Numbness present  left thumb, carpal tunnel syndrome, prior to CVA. Left sided facial droop and weakness improved. Dysphonia reported and coughing with meals reported. No change in mental status.  PSYCHIATRIC: No feelings of anxiety, depression No behavior issue   PHYSICAL EXAM Filed Vitals:   05/09/14 1403  BP: 161/76  Pulse: 61  Temp: 97.4 F (36.3 C)  Resp: 17  Weight: 135 lb 12.8 oz (61.598 kg)  SpO2: 97%   Body mass index is 26.52 kg/(m^2).  GENERAL APPEARANCE: No acute distress, appropriately groomed, normal body habitus. Alert, pleasant, conversation is appropriately paced, no  difficulty word finding today, pronunciation improved, minimal slurring, further improvement since last visit  SKIN: No diaphoresis, rash, unusual lesions, wounds  HEAD: Normocephalic, atraumatic. EYES: Conjunctiva/lids clear. 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 Lung sounds are clear and full.  CARDIOVASCULAR: Heart RRR. No murmur or extra heart sounds  EDEMA: No peripheral edema. Slight redness to BLE GASTROINTESTINAL: Abdomen is soft, non-tender, not distended w/ normal bowel sounds.  MUSCULOSKELETAL:  Mild tenderness lower back. LUE-4/5, LLE-3/5, RUE-5/5, RLE-4/5 NEUROLOGIC: Oriented to time, place, person.  Slight left facial droop.  PSYCHIATRIC: Mood and affect appropriate to situation, jokes appropriately with me    ASSESSMENT/PLAN  Other and unspecified hyperlipidemia Started on Zocor on 04/10/14. Despite previous complaints of leg cramps with statins she has tolerated this well. Her goal LDL per neuro is <100. Her last LDL was 129 on 02/01/14. Check lipid panel and LFTs with next lab draw  Dysphagia S/P CVA (cerebrovascular accident) Agree with changing of diet based on symptoms. Her lungs were clear today and she is afebrile, however, she will be at risk for asp pna. WIll need to continue ST and follow closely.  Unspecified constipation Improved per resident. Continue current meds  and montior  CVA (cerebral vascular accident) Patient saw Dr. Leonie Man in followup on 03/23/2014. He recommends close monitoring and treatment of blood pressure as well as using statin for secondary prevention.  Patient continues working with PT/OT/ST. She is making slow progress in all areas, continues to require skilled level of care. Anticipate this will be permanent level of care   Back pain Improved with Bengay. She states that she does not like to take pills and is content with her current regimen.   Hypertension Current BP is at goal most of the time.  Continue current meds and recheck BMP.    Family/ staff Communication:   Labs/tests ordered: BMP   Follow up: Return for Routine/as needed.  Sota Hetz T.Vaniah Chambers, NP-C/[employer] executive, MSN [facility] [phone]  05/09/2014

## 2014-05-10 DIAGNOSIS — R279 Unspecified lack of coordination: Secondary | ICD-10-CM | POA: Diagnosis not present

## 2014-05-10 DIAGNOSIS — I69991 Dysphagia following unspecified cerebrovascular disease: Secondary | ICD-10-CM | POA: Diagnosis not present

## 2014-05-10 DIAGNOSIS — R41842 Visuospatial deficit: Secondary | ICD-10-CM | POA: Diagnosis not present

## 2014-05-10 DIAGNOSIS — I6789 Other cerebrovascular disease: Secondary | ICD-10-CM | POA: Diagnosis not present

## 2014-05-10 DIAGNOSIS — I69922 Dysarthria following unspecified cerebrovascular disease: Secondary | ICD-10-CM | POA: Diagnosis not present

## 2014-05-10 DIAGNOSIS — I69959 Hemiplegia and hemiparesis following unspecified cerebrovascular disease affecting unspecified side: Secondary | ICD-10-CM | POA: Diagnosis not present

## 2014-05-11 DIAGNOSIS — I6350 Cerebral infarction due to unspecified occlusion or stenosis of unspecified cerebral artery: Secondary | ICD-10-CM | POA: Diagnosis not present

## 2014-05-11 DIAGNOSIS — I10 Essential (primary) hypertension: Secondary | ICD-10-CM | POA: Diagnosis not present

## 2014-05-11 DIAGNOSIS — R279 Unspecified lack of coordination: Secondary | ICD-10-CM | POA: Diagnosis not present

## 2014-05-11 DIAGNOSIS — I69922 Dysarthria following unspecified cerebrovascular disease: Secondary | ICD-10-CM | POA: Diagnosis not present

## 2014-05-11 DIAGNOSIS — I69959 Hemiplegia and hemiparesis following unspecified cerebrovascular disease affecting unspecified side: Secondary | ICD-10-CM | POA: Diagnosis not present

## 2014-05-11 DIAGNOSIS — I69991 Dysphagia following unspecified cerebrovascular disease: Secondary | ICD-10-CM | POA: Diagnosis not present

## 2014-05-11 DIAGNOSIS — I6789 Other cerebrovascular disease: Secondary | ICD-10-CM | POA: Diagnosis not present

## 2014-05-11 DIAGNOSIS — R41842 Visuospatial deficit: Secondary | ICD-10-CM | POA: Diagnosis not present

## 2014-05-11 LAB — BASIC METABOLIC PANEL
BUN: 25 mg/dL — AB (ref 4–21)
CREATININE: 0.8 mg/dL (ref 0.5–1.1)
Glucose: 86 mg/dL
POTASSIUM: 4.6 mmol/L (ref 3.4–5.3)
Sodium: 140 mmol/L (ref 137–147)

## 2014-05-11 LAB — HEPATIC FUNCTION PANEL
ALK PHOS: 82 U/L (ref 25–125)
ALT: 23 U/L (ref 7–35)
AST: 17 U/L (ref 13–35)

## 2014-05-16 DIAGNOSIS — I69991 Dysphagia following unspecified cerebrovascular disease: Secondary | ICD-10-CM | POA: Diagnosis not present

## 2014-05-16 DIAGNOSIS — I69959 Hemiplegia and hemiparesis following unspecified cerebrovascular disease affecting unspecified side: Secondary | ICD-10-CM | POA: Diagnosis not present

## 2014-05-16 DIAGNOSIS — R41842 Visuospatial deficit: Secondary | ICD-10-CM | POA: Diagnosis not present

## 2014-05-16 DIAGNOSIS — I6789 Other cerebrovascular disease: Secondary | ICD-10-CM | POA: Diagnosis not present

## 2014-05-16 DIAGNOSIS — R279 Unspecified lack of coordination: Secondary | ICD-10-CM | POA: Diagnosis not present

## 2014-05-16 DIAGNOSIS — I69922 Dysarthria following unspecified cerebrovascular disease: Secondary | ICD-10-CM | POA: Diagnosis not present

## 2014-05-17 DIAGNOSIS — I6789 Other cerebrovascular disease: Secondary | ICD-10-CM | POA: Diagnosis not present

## 2014-05-17 DIAGNOSIS — R279 Unspecified lack of coordination: Secondary | ICD-10-CM | POA: Diagnosis not present

## 2014-05-17 DIAGNOSIS — R41842 Visuospatial deficit: Secondary | ICD-10-CM | POA: Diagnosis not present

## 2014-05-17 DIAGNOSIS — I69991 Dysphagia following unspecified cerebrovascular disease: Secondary | ICD-10-CM | POA: Diagnosis not present

## 2014-05-17 DIAGNOSIS — I69959 Hemiplegia and hemiparesis following unspecified cerebrovascular disease affecting unspecified side: Secondary | ICD-10-CM | POA: Diagnosis not present

## 2014-05-17 DIAGNOSIS — I69922 Dysarthria following unspecified cerebrovascular disease: Secondary | ICD-10-CM | POA: Diagnosis not present

## 2014-05-18 DIAGNOSIS — I6789 Other cerebrovascular disease: Secondary | ICD-10-CM | POA: Diagnosis not present

## 2014-05-18 DIAGNOSIS — I69991 Dysphagia following unspecified cerebrovascular disease: Secondary | ICD-10-CM | POA: Diagnosis not present

## 2014-05-18 DIAGNOSIS — R279 Unspecified lack of coordination: Secondary | ICD-10-CM | POA: Diagnosis not present

## 2014-05-18 DIAGNOSIS — I69922 Dysarthria following unspecified cerebrovascular disease: Secondary | ICD-10-CM | POA: Diagnosis not present

## 2014-05-18 DIAGNOSIS — I69959 Hemiplegia and hemiparesis following unspecified cerebrovascular disease affecting unspecified side: Secondary | ICD-10-CM | POA: Diagnosis not present

## 2014-05-18 DIAGNOSIS — R41842 Visuospatial deficit: Secondary | ICD-10-CM | POA: Diagnosis not present

## 2014-05-19 DIAGNOSIS — I6789 Other cerebrovascular disease: Secondary | ICD-10-CM | POA: Diagnosis not present

## 2014-05-19 DIAGNOSIS — I69959 Hemiplegia and hemiparesis following unspecified cerebrovascular disease affecting unspecified side: Secondary | ICD-10-CM | POA: Diagnosis not present

## 2014-05-19 DIAGNOSIS — R41842 Visuospatial deficit: Secondary | ICD-10-CM | POA: Diagnosis not present

## 2014-05-19 DIAGNOSIS — I69991 Dysphagia following unspecified cerebrovascular disease: Secondary | ICD-10-CM | POA: Diagnosis not present

## 2014-05-19 DIAGNOSIS — R279 Unspecified lack of coordination: Secondary | ICD-10-CM | POA: Diagnosis not present

## 2014-05-19 DIAGNOSIS — I69922 Dysarthria following unspecified cerebrovascular disease: Secondary | ICD-10-CM | POA: Diagnosis not present

## 2014-05-22 DIAGNOSIS — I69991 Dysphagia following unspecified cerebrovascular disease: Secondary | ICD-10-CM | POA: Diagnosis not present

## 2014-05-22 DIAGNOSIS — R41842 Visuospatial deficit: Secondary | ICD-10-CM | POA: Diagnosis not present

## 2014-05-22 DIAGNOSIS — I69922 Dysarthria following unspecified cerebrovascular disease: Secondary | ICD-10-CM | POA: Diagnosis not present

## 2014-05-22 DIAGNOSIS — I6789 Other cerebrovascular disease: Secondary | ICD-10-CM | POA: Diagnosis not present

## 2014-05-22 DIAGNOSIS — R269 Unspecified abnormalities of gait and mobility: Secondary | ICD-10-CM | POA: Diagnosis not present

## 2014-05-22 DIAGNOSIS — R279 Unspecified lack of coordination: Secondary | ICD-10-CM | POA: Diagnosis not present

## 2014-05-22 DIAGNOSIS — M6281 Muscle weakness (generalized): Secondary | ICD-10-CM | POA: Diagnosis not present

## 2014-05-22 DIAGNOSIS — R1312 Dysphagia, oropharyngeal phase: Secondary | ICD-10-CM | POA: Diagnosis not present

## 2014-05-22 DIAGNOSIS — I69959 Hemiplegia and hemiparesis following unspecified cerebrovascular disease affecting unspecified side: Secondary | ICD-10-CM | POA: Diagnosis not present

## 2014-05-23 DIAGNOSIS — E7800 Pure hypercholesterolemia, unspecified: Secondary | ICD-10-CM | POA: Diagnosis not present

## 2014-05-23 LAB — LIPID PANEL
Cholesterol: 96 mg/dL (ref 0–200)
HDL: 43 mg/dL (ref 35–70)
LDL Cholesterol: 37 mg/dL
Triglycerides: 78 mg/dL (ref 40–160)

## 2014-05-30 ENCOUNTER — Non-Acute Institutional Stay (SKILLED_NURSING_FACILITY): Payer: Medicare Other | Admitting: Geriatric Medicine

## 2014-05-30 ENCOUNTER — Encounter: Payer: Self-pay | Admitting: Geriatric Medicine

## 2014-05-30 DIAGNOSIS — I69391 Dysphagia following cerebral infarction: Secondary | ICD-10-CM

## 2014-05-30 DIAGNOSIS — I10 Essential (primary) hypertension: Secondary | ICD-10-CM

## 2014-05-30 DIAGNOSIS — E785 Hyperlipidemia, unspecified: Secondary | ICD-10-CM | POA: Diagnosis not present

## 2014-05-30 DIAGNOSIS — I69991 Dysphagia following unspecified cerebrovascular disease: Secondary | ICD-10-CM

## 2014-05-30 DIAGNOSIS — I639 Cerebral infarction, unspecified: Secondary | ICD-10-CM

## 2014-05-30 DIAGNOSIS — I6350 Cerebral infarction due to unspecified occlusion or stenosis of unspecified cerebral artery: Secondary | ICD-10-CM

## 2014-05-30 MED ORDER — SIMVASTATIN 20 MG PO TABS: 20.0000 mg | ORAL_TABLET | ORAL | Status: DC

## 2014-05-30 NOTE — Progress Notes (Addendum)
Patient ID: Sonya [name], female   DOB: Jun 19, 1913, 80 y.o.   MRN: [id number]    [facility] (31)  Code status: DNR  Chief Complaint  Patient presents with  . Cerebrovascular Accident    HPI: This is a 80 y.o. female resident of [place], [place] residing in [place] section at [facility] following a CVA in early February 2015.    Last visit  Other and unspecified hyperlipidemia Started on Zocor on 04/10/14. Despite previous complaints of leg cramps with statins she has tolerated this well. Her goal LDL per neuro is <100. Her last LDL was 129 on 02/01/14. Check lipid panel and LFTs with next lab draw Dysphagia S/P CVA (cerebrovascular accident) Agree with changing of diet based on symptoms. Her lungs were clear today and she is afebrile, however, she will be at risk for asp pna. WIll need to continue ST and follow closely. Unspecified constipation Improved per resident. Continue current meds  and montior CVA (cerebral vascular accident) Patient saw Dr. Leonie Man in followup on 03/23/2014. He recommends close monitoring and treatment of blood pressure as well as using statin for secondary prevention.  Patient continues working with PT/OT/ST. She is making slow progress in all areas, continues to require skilled level of care. Anticipate this will be permanent level of care Back pain Improved with Bengay. She states that she does not like to take pills and is content with her current regimen.  Hypertension Current BP is at goal most of the time. Continue current meds and recheck BMP.   Since last visit patient has remained stable. ST continues to follow swallow function; continues NTL with meals, thin liquids between meals.  Patient has plateaued with PT, these services have been d/c OT has been working with patient re: learning to drive motorized WC; she is making progress toward independent function with this vehicle.  Lab results show  very low cholesterol levels, stable electrolytes/ LFTs Review of facility record shows occasional SBP>160, weight is stable.    Allergies  Allergen Reactions  . Plavix [Clopidogrel] Nausea And Vomiting  . Statins Other (See Comments)    Leg cramps    MEDICATIONS -  Reviewed  DATA REVIEWED   Laboratory Studies: Lab Results  Component Value Date   WBC 8.6 02/16/2014   HGB 12.8 02/16/2014   HCT 38 02/16/2014   MCV 91.0 01/31/2014   PLT 334 02/16/2014     Lab Results  Component Value Date   NA 140 05/11/2014   K 4.6 05/11/2014   GLU 86 05/11/2014   BUN 25* 05/11/2014   CREATININE 0.8 05/11/2014   Lab Results  Component Value Date   ALT 23 05/11/2014   AST 17 05/11/2014   ALKPHOS 82 05/11/2014   BILITOT 0.5 01/31/2014   Lab Results  Component Value Date   CHOL 96 05/23/2014   CHOL 235* 02/01/2014   CHOL 180 09/22/2013   Lab Results  Component Value Date   HDL 43 05/23/2014   HDL 69 02/01/2014   HDL 55 09/22/2013   Lab Results  Component Value Date   LDLCALC 37 05/23/2014   LDLCALC 129* 02/01/2014   LDLCALC 92 09/22/2013   Lab Results  Component Value Date   TRIG 78 05/23/2014   TRIG 185* 02/01/2014   TRIG 167* 09/22/2013   Lab Results  Component Value Date   CHOLHDL 3.4 02/01/2014   CHOLHDL 3.1 02/20/2011   Lab Results  Component Value Date   TSH  1.12 09/22/2013     REVIEW OF SYSTEMS  DATA OBTAINED: from patient, medical record,  GENERAL: Feels well. No recent fever, change in appetite or weight.  SKIN: No itch, rash or open wounds  EYES: No eye pain, dryness or itching Nearly Blind  EARS: No earache, tinnitus, change in hearing  NOSE: No congestion, drainage or bleeding  MOUTH/THROAT: No mouth or tooth pain No sore throat . Tolerating mechanical soft ground meat diet, NTL at meals without difficulty swallowing or choking . Does cough between meals  RESPIRATORY: No wheezing, SOB  CARDIAC: No chest pain, palpitations No edema.  GI: No abdominal pain No nausea,  vomiting,diarrhea. BM's have improved, GU: No dysuria, voiding easier MUSCULOSKELETAL: Low back pain unchanged, managed with BenGay.  NEUROLOGIC: No dizziness, fainting, headache. Numbness present left thumb, carpal tunnel syndrome, prior to CVA. Left sided facial droop and weakness improved. Dysphonia persists. No change in mental status.  PSYCHIATRIC: No feelings of anxiety, depression No behavior issue   PHYSICAL EXAM Filed Vitals:   05/30/14 1432  BP: 134/60  Pulse: 66  Weight: 135 lb 6.4 oz (61.417 kg)   Body mass index is 26.44 kg/(m^2).  GENERAL APPEARANCE: No acute distress, appropriately groomed, normal body habitus. Alert, pleasant, conversation is appropriately paced, no difficulty word finding, good pronunciation improved, no slurring,  SKIN: No diaphoresis, rash, unusual lesions, wounds  HEAD: Normocephalic, atraumatic. EYES: Conjunctiva/lids clear. 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 Lung sounds are clear and full.  CARDIOVASCULAR: Heart RRR. No murmur or extra heart sounds  EDEMA: No peripheral edema. Slight redness to BLE GASTROINTESTINAL: Abdomen is soft, non-tender, not distended w/ normal bowel sounds.  MUSCULOSKELETAL:  Mild tenderness lower back. LUE-4/5, LLE-3/5, RUE-5/5, RLE-4/5 NEUROLOGIC: Oriented to time, place, person.  Slight left facial droop.  PSYCHIATRIC: Mood and affect appropriate to situation, jokes appropriately with me    ASSESSMENT/PLAN  CVA (cerebral vascular accident) S/P CVA 01/2014 Left sided weakness, dysarthria, dysphagia. Patient has plateaued with ADLs/transfers; remains at Skilled level of care. Will be moving to permanent Skilled room in the next 2 weeks.    Hypertension BP range satisfactory 110-169/59-76, P64-67. Continue current medication, update lab at intervals  Dysphagia S/P CVA (cerebrovascular accident) Continues ST interventions , chopped foods, NTL at meals, thin liquids between  meals. Coughs between meals. No sign of respiratory ocmpromise  Other and unspecified hyperlipidemia Statin therapy for secondary prevention re: CVA. Most recent lipid panel with very low LDL level, decrease statin dose, repeat lipid panel 3 months    Family/ staff Communication:   Labs/tests ordered:  08/2014 lipid panel, BMP  Follow up: Return for Routine/as needed.  Mardene Celeste, NP-C  [place] [phone]  05/30/2014

## 2014-05-30 NOTE — Assessment & Plan Note (Signed)
Continues ST interventions , chopped foods, NTL at meals, thin liquids between meals. Coughs between meals. No sign of respiratory ocmpromise

## 2014-05-30 NOTE — Assessment & Plan Note (Signed)
Statin therapy for secondary prevention re: CVA. Most recent lipid panel with very low LDL level, decrease statin dose, repeat lipid panel 3 months

## 2014-05-30 NOTE — Addendum Note (Signed)
Addended by: Lestine Box T on: 05/30/2014 03:31 PM   Modules accepted: Orders

## 2014-05-30 NOTE — Assessment & Plan Note (Signed)
S/P CVA 01/2014 Left sided weakness, dysarthria, dysphagia. Patient has plateaued with ADLs/transfers; remains at Skilled level of care. Will be moving to permanent Skilled room in the next 2 weeks.

## 2014-05-30 NOTE — Assessment & Plan Note (Signed)
BP range satisfactory 110-169/59-76, P64-67. Continue current medication, update lab at intervals

## 2014-06-08 DIAGNOSIS — H353290 Exudative age-related macular degeneration, unspecified eye, stage unspecified: Secondary | ICD-10-CM | POA: Diagnosis not present

## 2014-06-08 DIAGNOSIS — H18429 Band keratopathy, unspecified eye: Secondary | ICD-10-CM | POA: Diagnosis not present

## 2014-06-08 DIAGNOSIS — Z961 Presence of intraocular lens: Secondary | ICD-10-CM | POA: Diagnosis not present

## 2014-06-21 DIAGNOSIS — I69959 Hemiplegia and hemiparesis following unspecified cerebrovascular disease affecting unspecified side: Secondary | ICD-10-CM | POA: Diagnosis not present

## 2014-06-21 DIAGNOSIS — I69991 Dysphagia following unspecified cerebrovascular disease: Secondary | ICD-10-CM | POA: Diagnosis not present

## 2014-06-21 DIAGNOSIS — I69922 Dysarthria following unspecified cerebrovascular disease: Secondary | ICD-10-CM | POA: Diagnosis not present

## 2014-06-21 DIAGNOSIS — M6281 Muscle weakness (generalized): Secondary | ICD-10-CM | POA: Diagnosis not present

## 2014-06-21 DIAGNOSIS — R1312 Dysphagia, oropharyngeal phase: Secondary | ICD-10-CM | POA: Diagnosis not present

## 2014-06-21 DIAGNOSIS — R41842 Visuospatial deficit: Secondary | ICD-10-CM | POA: Diagnosis not present

## 2014-06-21 DIAGNOSIS — R279 Unspecified lack of coordination: Secondary | ICD-10-CM | POA: Diagnosis not present

## 2014-06-22 DIAGNOSIS — I69991 Dysphagia following unspecified cerebrovascular disease: Secondary | ICD-10-CM | POA: Diagnosis not present

## 2014-06-22 DIAGNOSIS — R41842 Visuospatial deficit: Secondary | ICD-10-CM | POA: Diagnosis not present

## 2014-06-22 DIAGNOSIS — I69922 Dysarthria following unspecified cerebrovascular disease: Secondary | ICD-10-CM | POA: Diagnosis not present

## 2014-06-22 DIAGNOSIS — I69959 Hemiplegia and hemiparesis following unspecified cerebrovascular disease affecting unspecified side: Secondary | ICD-10-CM | POA: Diagnosis not present

## 2014-06-22 DIAGNOSIS — R279 Unspecified lack of coordination: Secondary | ICD-10-CM | POA: Diagnosis not present

## 2014-06-23 DIAGNOSIS — R41842 Visuospatial deficit: Secondary | ICD-10-CM | POA: Diagnosis not present

## 2014-06-23 DIAGNOSIS — I69991 Dysphagia following unspecified cerebrovascular disease: Secondary | ICD-10-CM | POA: Diagnosis not present

## 2014-06-23 DIAGNOSIS — R279 Unspecified lack of coordination: Secondary | ICD-10-CM | POA: Diagnosis not present

## 2014-06-23 DIAGNOSIS — I69959 Hemiplegia and hemiparesis following unspecified cerebrovascular disease affecting unspecified side: Secondary | ICD-10-CM | POA: Diagnosis not present

## 2014-06-23 DIAGNOSIS — I69922 Dysarthria following unspecified cerebrovascular disease: Secondary | ICD-10-CM | POA: Diagnosis not present

## 2014-06-26 DIAGNOSIS — R41842 Visuospatial deficit: Secondary | ICD-10-CM | POA: Diagnosis not present

## 2014-06-26 DIAGNOSIS — I69991 Dysphagia following unspecified cerebrovascular disease: Secondary | ICD-10-CM | POA: Diagnosis not present

## 2014-06-26 DIAGNOSIS — I69959 Hemiplegia and hemiparesis following unspecified cerebrovascular disease affecting unspecified side: Secondary | ICD-10-CM | POA: Diagnosis not present

## 2014-06-26 DIAGNOSIS — I69922 Dysarthria following unspecified cerebrovascular disease: Secondary | ICD-10-CM | POA: Diagnosis not present

## 2014-06-26 DIAGNOSIS — R279 Unspecified lack of coordination: Secondary | ICD-10-CM | POA: Diagnosis not present

## 2014-06-27 DIAGNOSIS — I69922 Dysarthria following unspecified cerebrovascular disease: Secondary | ICD-10-CM | POA: Diagnosis not present

## 2014-06-27 DIAGNOSIS — I69991 Dysphagia following unspecified cerebrovascular disease: Secondary | ICD-10-CM | POA: Diagnosis not present

## 2014-06-27 DIAGNOSIS — I69959 Hemiplegia and hemiparesis following unspecified cerebrovascular disease affecting unspecified side: Secondary | ICD-10-CM | POA: Diagnosis not present

## 2014-06-27 DIAGNOSIS — R279 Unspecified lack of coordination: Secondary | ICD-10-CM | POA: Diagnosis not present

## 2014-06-27 DIAGNOSIS — R41842 Visuospatial deficit: Secondary | ICD-10-CM | POA: Diagnosis not present

## 2014-06-28 DIAGNOSIS — R41842 Visuospatial deficit: Secondary | ICD-10-CM | POA: Diagnosis not present

## 2014-06-28 DIAGNOSIS — R279 Unspecified lack of coordination: Secondary | ICD-10-CM | POA: Diagnosis not present

## 2014-06-28 DIAGNOSIS — I69991 Dysphagia following unspecified cerebrovascular disease: Secondary | ICD-10-CM | POA: Diagnosis not present

## 2014-06-28 DIAGNOSIS — I69959 Hemiplegia and hemiparesis following unspecified cerebrovascular disease affecting unspecified side: Secondary | ICD-10-CM | POA: Diagnosis not present

## 2014-06-28 DIAGNOSIS — I69922 Dysarthria following unspecified cerebrovascular disease: Secondary | ICD-10-CM | POA: Diagnosis not present

## 2014-06-29 DIAGNOSIS — R41842 Visuospatial deficit: Secondary | ICD-10-CM | POA: Diagnosis not present

## 2014-06-29 DIAGNOSIS — Z961 Presence of intraocular lens: Secondary | ICD-10-CM | POA: Diagnosis not present

## 2014-06-29 DIAGNOSIS — I69922 Dysarthria following unspecified cerebrovascular disease: Secondary | ICD-10-CM | POA: Diagnosis not present

## 2014-06-29 DIAGNOSIS — I69991 Dysphagia following unspecified cerebrovascular disease: Secondary | ICD-10-CM | POA: Diagnosis not present

## 2014-06-29 DIAGNOSIS — I69959 Hemiplegia and hemiparesis following unspecified cerebrovascular disease affecting unspecified side: Secondary | ICD-10-CM | POA: Diagnosis not present

## 2014-06-29 DIAGNOSIS — R279 Unspecified lack of coordination: Secondary | ICD-10-CM | POA: Diagnosis not present

## 2014-06-29 DIAGNOSIS — H353290 Exudative age-related macular degeneration, unspecified eye, stage unspecified: Secondary | ICD-10-CM | POA: Diagnosis not present

## 2014-06-29 DIAGNOSIS — H18429 Band keratopathy, unspecified eye: Secondary | ICD-10-CM | POA: Diagnosis not present

## 2014-06-30 DIAGNOSIS — I69922 Dysarthria following unspecified cerebrovascular disease: Secondary | ICD-10-CM | POA: Diagnosis not present

## 2014-06-30 DIAGNOSIS — R279 Unspecified lack of coordination: Secondary | ICD-10-CM | POA: Diagnosis not present

## 2014-06-30 DIAGNOSIS — I69991 Dysphagia following unspecified cerebrovascular disease: Secondary | ICD-10-CM | POA: Diagnosis not present

## 2014-06-30 DIAGNOSIS — I69959 Hemiplegia and hemiparesis following unspecified cerebrovascular disease affecting unspecified side: Secondary | ICD-10-CM | POA: Diagnosis not present

## 2014-06-30 DIAGNOSIS — R41842 Visuospatial deficit: Secondary | ICD-10-CM | POA: Diagnosis not present

## 2014-07-03 DIAGNOSIS — I69922 Dysarthria following unspecified cerebrovascular disease: Secondary | ICD-10-CM | POA: Diagnosis not present

## 2014-07-03 DIAGNOSIS — R279 Unspecified lack of coordination: Secondary | ICD-10-CM | POA: Diagnosis not present

## 2014-07-03 DIAGNOSIS — R41842 Visuospatial deficit: Secondary | ICD-10-CM | POA: Diagnosis not present

## 2014-07-03 DIAGNOSIS — I69991 Dysphagia following unspecified cerebrovascular disease: Secondary | ICD-10-CM | POA: Diagnosis not present

## 2014-07-03 DIAGNOSIS — I69959 Hemiplegia and hemiparesis following unspecified cerebrovascular disease affecting unspecified side: Secondary | ICD-10-CM | POA: Diagnosis not present

## 2014-07-04 DIAGNOSIS — I69959 Hemiplegia and hemiparesis following unspecified cerebrovascular disease affecting unspecified side: Secondary | ICD-10-CM | POA: Diagnosis not present

## 2014-07-04 DIAGNOSIS — I69991 Dysphagia following unspecified cerebrovascular disease: Secondary | ICD-10-CM | POA: Diagnosis not present

## 2014-07-04 DIAGNOSIS — I69922 Dysarthria following unspecified cerebrovascular disease: Secondary | ICD-10-CM | POA: Diagnosis not present

## 2014-07-04 DIAGNOSIS — R41842 Visuospatial deficit: Secondary | ICD-10-CM | POA: Diagnosis not present

## 2014-07-04 DIAGNOSIS — R279 Unspecified lack of coordination: Secondary | ICD-10-CM | POA: Diagnosis not present

## 2014-07-05 DIAGNOSIS — I69959 Hemiplegia and hemiparesis following unspecified cerebrovascular disease affecting unspecified side: Secondary | ICD-10-CM | POA: Diagnosis not present

## 2014-07-05 DIAGNOSIS — R41842 Visuospatial deficit: Secondary | ICD-10-CM | POA: Diagnosis not present

## 2014-07-05 DIAGNOSIS — R279 Unspecified lack of coordination: Secondary | ICD-10-CM | POA: Diagnosis not present

## 2014-07-05 DIAGNOSIS — I69922 Dysarthria following unspecified cerebrovascular disease: Secondary | ICD-10-CM | POA: Diagnosis not present

## 2014-07-05 DIAGNOSIS — I69991 Dysphagia following unspecified cerebrovascular disease: Secondary | ICD-10-CM | POA: Diagnosis not present

## 2014-07-06 DIAGNOSIS — R41842 Visuospatial deficit: Secondary | ICD-10-CM | POA: Diagnosis not present

## 2014-07-06 DIAGNOSIS — I69991 Dysphagia following unspecified cerebrovascular disease: Secondary | ICD-10-CM | POA: Diagnosis not present

## 2014-07-06 DIAGNOSIS — R279 Unspecified lack of coordination: Secondary | ICD-10-CM | POA: Diagnosis not present

## 2014-07-06 DIAGNOSIS — I69922 Dysarthria following unspecified cerebrovascular disease: Secondary | ICD-10-CM | POA: Diagnosis not present

## 2014-07-06 DIAGNOSIS — I69959 Hemiplegia and hemiparesis following unspecified cerebrovascular disease affecting unspecified side: Secondary | ICD-10-CM | POA: Diagnosis not present

## 2014-07-07 DIAGNOSIS — I69991 Dysphagia following unspecified cerebrovascular disease: Secondary | ICD-10-CM | POA: Diagnosis not present

## 2014-07-07 DIAGNOSIS — R41842 Visuospatial deficit: Secondary | ICD-10-CM | POA: Diagnosis not present

## 2014-07-07 DIAGNOSIS — R279 Unspecified lack of coordination: Secondary | ICD-10-CM | POA: Diagnosis not present

## 2014-07-07 DIAGNOSIS — I69959 Hemiplegia and hemiparesis following unspecified cerebrovascular disease affecting unspecified side: Secondary | ICD-10-CM | POA: Diagnosis not present

## 2014-07-07 DIAGNOSIS — I69922 Dysarthria following unspecified cerebrovascular disease: Secondary | ICD-10-CM | POA: Diagnosis not present

## 2014-07-10 DIAGNOSIS — I69959 Hemiplegia and hemiparesis following unspecified cerebrovascular disease affecting unspecified side: Secondary | ICD-10-CM | POA: Diagnosis not present

## 2014-07-10 DIAGNOSIS — I69922 Dysarthria following unspecified cerebrovascular disease: Secondary | ICD-10-CM | POA: Diagnosis not present

## 2014-07-10 DIAGNOSIS — I69991 Dysphagia following unspecified cerebrovascular disease: Secondary | ICD-10-CM | POA: Diagnosis not present

## 2014-07-10 DIAGNOSIS — R279 Unspecified lack of coordination: Secondary | ICD-10-CM | POA: Diagnosis not present

## 2014-07-10 DIAGNOSIS — R41842 Visuospatial deficit: Secondary | ICD-10-CM | POA: Diagnosis not present

## 2014-07-11 DIAGNOSIS — I69959 Hemiplegia and hemiparesis following unspecified cerebrovascular disease affecting unspecified side: Secondary | ICD-10-CM | POA: Diagnosis not present

## 2014-07-11 DIAGNOSIS — R279 Unspecified lack of coordination: Secondary | ICD-10-CM | POA: Diagnosis not present

## 2014-07-11 DIAGNOSIS — I69922 Dysarthria following unspecified cerebrovascular disease: Secondary | ICD-10-CM | POA: Diagnosis not present

## 2014-07-11 DIAGNOSIS — I69991 Dysphagia following unspecified cerebrovascular disease: Secondary | ICD-10-CM | POA: Diagnosis not present

## 2014-07-11 DIAGNOSIS — R41842 Visuospatial deficit: Secondary | ICD-10-CM | POA: Diagnosis not present

## 2014-07-13 DIAGNOSIS — R279 Unspecified lack of coordination: Secondary | ICD-10-CM | POA: Diagnosis not present

## 2014-07-13 DIAGNOSIS — I69922 Dysarthria following unspecified cerebrovascular disease: Secondary | ICD-10-CM | POA: Diagnosis not present

## 2014-07-13 DIAGNOSIS — R41842 Visuospatial deficit: Secondary | ICD-10-CM | POA: Diagnosis not present

## 2014-07-13 DIAGNOSIS — I69991 Dysphagia following unspecified cerebrovascular disease: Secondary | ICD-10-CM | POA: Diagnosis not present

## 2014-07-13 DIAGNOSIS — I69959 Hemiplegia and hemiparesis following unspecified cerebrovascular disease affecting unspecified side: Secondary | ICD-10-CM | POA: Diagnosis not present

## 2014-07-14 DIAGNOSIS — R279 Unspecified lack of coordination: Secondary | ICD-10-CM | POA: Diagnosis not present

## 2014-07-14 DIAGNOSIS — I69991 Dysphagia following unspecified cerebrovascular disease: Secondary | ICD-10-CM | POA: Diagnosis not present

## 2014-07-14 DIAGNOSIS — I69959 Hemiplegia and hemiparesis following unspecified cerebrovascular disease affecting unspecified side: Secondary | ICD-10-CM | POA: Diagnosis not present

## 2014-07-14 DIAGNOSIS — I69922 Dysarthria following unspecified cerebrovascular disease: Secondary | ICD-10-CM | POA: Diagnosis not present

## 2014-07-14 DIAGNOSIS — R41842 Visuospatial deficit: Secondary | ICD-10-CM | POA: Diagnosis not present

## 2014-07-17 DIAGNOSIS — I69991 Dysphagia following unspecified cerebrovascular disease: Secondary | ICD-10-CM | POA: Diagnosis not present

## 2014-07-17 DIAGNOSIS — I69959 Hemiplegia and hemiparesis following unspecified cerebrovascular disease affecting unspecified side: Secondary | ICD-10-CM | POA: Diagnosis not present

## 2014-07-17 DIAGNOSIS — R41842 Visuospatial deficit: Secondary | ICD-10-CM | POA: Diagnosis not present

## 2014-07-17 DIAGNOSIS — R279 Unspecified lack of coordination: Secondary | ICD-10-CM | POA: Diagnosis not present

## 2014-07-17 DIAGNOSIS — I69922 Dysarthria following unspecified cerebrovascular disease: Secondary | ICD-10-CM | POA: Diagnosis not present

## 2014-07-18 DIAGNOSIS — I69922 Dysarthria following unspecified cerebrovascular disease: Secondary | ICD-10-CM | POA: Diagnosis not present

## 2014-07-18 DIAGNOSIS — R279 Unspecified lack of coordination: Secondary | ICD-10-CM | POA: Diagnosis not present

## 2014-07-18 DIAGNOSIS — I69959 Hemiplegia and hemiparesis following unspecified cerebrovascular disease affecting unspecified side: Secondary | ICD-10-CM | POA: Diagnosis not present

## 2014-07-18 DIAGNOSIS — I69991 Dysphagia following unspecified cerebrovascular disease: Secondary | ICD-10-CM | POA: Diagnosis not present

## 2014-07-18 DIAGNOSIS — R41842 Visuospatial deficit: Secondary | ICD-10-CM | POA: Diagnosis not present

## 2014-07-19 DIAGNOSIS — I69922 Dysarthria following unspecified cerebrovascular disease: Secondary | ICD-10-CM | POA: Diagnosis not present

## 2014-07-19 DIAGNOSIS — I69959 Hemiplegia and hemiparesis following unspecified cerebrovascular disease affecting unspecified side: Secondary | ICD-10-CM | POA: Diagnosis not present

## 2014-07-19 DIAGNOSIS — R279 Unspecified lack of coordination: Secondary | ICD-10-CM | POA: Diagnosis not present

## 2014-07-19 DIAGNOSIS — I69991 Dysphagia following unspecified cerebrovascular disease: Secondary | ICD-10-CM | POA: Diagnosis not present

## 2014-07-19 DIAGNOSIS — R41842 Visuospatial deficit: Secondary | ICD-10-CM | POA: Diagnosis not present

## 2014-07-20 DIAGNOSIS — I69922 Dysarthria following unspecified cerebrovascular disease: Secondary | ICD-10-CM | POA: Diagnosis not present

## 2014-07-20 DIAGNOSIS — R41842 Visuospatial deficit: Secondary | ICD-10-CM | POA: Diagnosis not present

## 2014-07-20 DIAGNOSIS — I69991 Dysphagia following unspecified cerebrovascular disease: Secondary | ICD-10-CM | POA: Diagnosis not present

## 2014-07-20 DIAGNOSIS — R279 Unspecified lack of coordination: Secondary | ICD-10-CM | POA: Diagnosis not present

## 2014-07-20 DIAGNOSIS — I69959 Hemiplegia and hemiparesis following unspecified cerebrovascular disease affecting unspecified side: Secondary | ICD-10-CM | POA: Diagnosis not present

## 2014-07-21 DIAGNOSIS — I69959 Hemiplegia and hemiparesis following unspecified cerebrovascular disease affecting unspecified side: Secondary | ICD-10-CM | POA: Diagnosis not present

## 2014-07-21 DIAGNOSIS — R279 Unspecified lack of coordination: Secondary | ICD-10-CM | POA: Diagnosis not present

## 2014-07-21 DIAGNOSIS — R41842 Visuospatial deficit: Secondary | ICD-10-CM | POA: Diagnosis not present

## 2014-07-21 DIAGNOSIS — I69991 Dysphagia following unspecified cerebrovascular disease: Secondary | ICD-10-CM | POA: Diagnosis not present

## 2014-07-21 DIAGNOSIS — I69922 Dysarthria following unspecified cerebrovascular disease: Secondary | ICD-10-CM | POA: Diagnosis not present

## 2014-07-24 ENCOUNTER — Ambulatory Visit: Payer: Self-pay | Admitting: Nurse Practitioner

## 2014-07-24 DIAGNOSIS — R279 Unspecified lack of coordination: Secondary | ICD-10-CM | POA: Diagnosis not present

## 2014-07-24 DIAGNOSIS — R41842 Visuospatial deficit: Secondary | ICD-10-CM | POA: Diagnosis not present

## 2014-07-24 DIAGNOSIS — I69959 Hemiplegia and hemiparesis following unspecified cerebrovascular disease affecting unspecified side: Secondary | ICD-10-CM | POA: Diagnosis not present

## 2014-07-24 DIAGNOSIS — M6281 Muscle weakness (generalized): Secondary | ICD-10-CM | POA: Diagnosis not present

## 2014-07-25 ENCOUNTER — Encounter: Payer: Self-pay | Admitting: Nurse Practitioner

## 2014-07-25 ENCOUNTER — Ambulatory Visit (INDEPENDENT_AMBULATORY_CARE_PROVIDER_SITE_OTHER): Payer: Medicare Other | Admitting: Nurse Practitioner

## 2014-07-25 VITALS — BP 170/66 | HR 66 | Ht 60.0 in

## 2014-07-25 DIAGNOSIS — I69391 Dysphagia following cerebral infarction: Secondary | ICD-10-CM

## 2014-07-25 DIAGNOSIS — Z8673 Personal history of transient ischemic attack (TIA), and cerebral infarction without residual deficits: Secondary | ICD-10-CM | POA: Diagnosis not present

## 2014-07-25 DIAGNOSIS — I2510 Atherosclerotic heart disease of native coronary artery without angina pectoris: Secondary | ICD-10-CM

## 2014-07-25 DIAGNOSIS — I6350 Cerebral infarction due to unspecified occlusion or stenosis of unspecified cerebral artery: Secondary | ICD-10-CM

## 2014-07-25 DIAGNOSIS — I2109 ST elevation (STEMI) myocardial infarction involving other coronary artery of anterior wall: Secondary | ICD-10-CM | POA: Diagnosis not present

## 2014-07-25 DIAGNOSIS — I69991 Dysphagia following unspecified cerebrovascular disease: Secondary | ICD-10-CM | POA: Diagnosis not present

## 2014-07-25 DIAGNOSIS — R0602 Shortness of breath: Secondary | ICD-10-CM | POA: Diagnosis not present

## 2014-07-25 DIAGNOSIS — I6381 Other cerebral infarction due to occlusion or stenosis of small artery: Secondary | ICD-10-CM

## 2014-07-25 DIAGNOSIS — I10 Essential (primary) hypertension: Secondary | ICD-10-CM | POA: Diagnosis not present

## 2014-07-25 DIAGNOSIS — I5031 Acute diastolic (congestive) heart failure: Secondary | ICD-10-CM | POA: Diagnosis not present

## 2014-07-25 NOTE — Progress Notes (Signed)
PATIENT: Sonya [name] DOB: 07/08/1913  REASON FOR VISIT: routine follow up for stroke HISTORY FROM: patient  HISTORY OF PRESENT ILLNESS: 6 year Caucasian lady who is accompanied today by her friend and provides most of the history herself. She is seen today for followup after hospital admission on 01/31/14 with a stroke. She woke up with slurred speech and was found to have generalized weakness. CT scan of the head was unremarkable but MRI scan showed bilateral lacunar infarcts in the right corona radiata and left basal ganglia. Transthoracic echo was unremarkable. Carotid Dopplers did not reveal any significant extracranial stenosis. Lipid profile showed LDL cholesterol of 129. She was started on aspirin. She has some dysarthria and dysphagia which improved during hospitalization. [facility] was [facility] the rehabilitation section. She's actually stated that her speech and truncal balance got worse a few days later. She did not get any further workup. She in fact is now spending most of the time in a wheelchair as she tends to lean to the left and fall when she tries to get up. She is unable to stand and walk even with assistance of physical therapist. She states her speech has improved and swallowing too. Is tolerating aspirin well without side effects. She states her blood pressure is under good control. She is not on a statin for unclear reason. She is mentally quite sharp despite her age.   Update 07/25/14 (LL): Ms. Vowell returns to the office for stroke follow up, last visit was 03/23/14 with Dr. Leonie Man. She's accompanied by a friend. She resides at [facility] in the skilled nursing section.  She uses a motorized wheelchair and requires a lift for transfers.  She is now on statin medication, last LDL was 37, total cholesterol 96, dose was reduced recently. She has completed all physical therapy occupational therapy and speech therapy. She  is tolerating daily aspirin without significant bleeding or bruising. She states her blood pressure is under good control. Her speech is improved, she states she still has some difficulty swallowing. She states she keeps a runny nose and a dry cough. She has no complaints today.  ROS:  14 system review of systems is positive for weakness, difficulty swallowing, speech difficulty, balance and gait difficulty. All other systems negative  ALLERGIES: Allergies  Allergen Reactions  . Plavix [Clopidogrel] Nausea And Vomiting  . Statins Other (See Comments)    Leg cramps    HOME MEDICATIONS: Outpatient Prescriptions Prior to Visit  Medication Sig Dispense Refill  . antiseptic oral rinse (BIOTENE) LIQD 15 mLs by Mouth Rinse route 4 (four) times daily.      Marland Kitchen aspirin 325 MG tablet Take 1 tablet (325 mg total) by mouth daily.      Marland Kitchen FLUoxetine (PROZAC) 20 MG capsule Take 20 mg by mouth daily.      . Fluticasone-Salmeterol (ADVAIR DISKUS) 250-50 MCG/DOSE AEPB Inhale 1 puff into the lungs every 12 (twelve) hours.        . furosemide (LASIX) 20 MG tablet Take 20 mg by mouth daily.      . isosorbide mononitrate (IMDUR) 60 MG 24 hr tablet Take 60 mg by mouth at bedtime.      Marland Kitchen lisinopril (PRINIVIL,ZESTRIL) 10 MG tablet Take 1 tablet (10 mg total) by mouth daily.      Marland Kitchen loteprednol (LOTEMAX) 0.5 % ophthalmic suspension Place 1 drop into the left eye. One drop in left eye 1-2 times daily as  needed      . metoprolol succinate (TOPROL-XL) 25 MG 24 hr tablet Take 25 mg by mouth daily.      . Multiple Vitamins-Minerals (ICAPS PO) Take 1 tablet by mouth daily.      Marland Kitchen NEXIUM 40 MG capsule Take 1 capsule by mouth every evening.       . simvastatin (ZOCOR) 20 MG tablet Take 1 tablet (20 mg total) by mouth every other day.      . Maltodextrin-Xanthan Gum (RESOURCE THICKENUP CLEAR) POWD Take 120 g by mouth as needed.      . Menthol, Topical Analgesic, (BENGAY VANISHING SCENT) 2.5 % GEL Apply 1 application  topically as directed. Apply to low back area BID and prn to reduce discomfort      . nitroGLYCERIN (NITROSTAT) 0.4 MG SL tablet Place 0.4 mg under the tongue every 5 (five) minutes as needed for chest pain.       Marland Kitchen sennosides-docusate sodium (SENOKOT-S) 8.6-50 MG tablet Take 2 tablets by mouth at bedtime.      Marland Kitchen atorvastatin (LIPITOR) 20 MG tablet        No facility-administered medications prior to visit.    PHYSICAL EXAM Filed Vitals:   07/25/14 1445  BP: 170/66  Pulse: 66  Height: 5' (1.524 m)    Physical Exam  General: Frail elderly Caucasian lady seated in a wheelchair, in no evident distress  Head: head normocephalic and atraumatic. Orohparynx benign  Neck: supple with no carotid or supraclavicular bruits  Cardiovascular: regular rate and rhythm, no murmurs  Musculoskeletal: Kyphoscoliosis  Skin: no rash/petichiae. Trace pedal edema  Vascular: Normal pulses all extremities   Neurologic Exam  Mental Status: Awake and fully alert. Oriented to place and time. Recent and remote memory slightly diminished. Attention span, concentration and fund of knowledge appropriate. Mood and affect appropriate.  Cranial Nerves: Fundoscopic exam not done. Pupils equal, briskly reactive to light on right. Left corneal opacity. Decreased vision left eye. Extraocular movements full without nystagmus. Visual fields full to confrontation. Hearing diminished bilaterally. Facial sensation intact. Face, tongue, palate moves normally and symmetrically.  Motor: Normal bulk and tone. Normal strength in all tested extremity muscles. Except mild weakness of left grip and intrinsic hand muscles. Orbits right over left upper extremity. Mild proximal hip flexor weakness in both lower activities.  Sensory: intact to light touch on all 4 extremities Coordination: Rapid alternating movements normal in all extremities. Finger-to-nose and heel-to-shin performed accurately bilaterally.  Gait and Station: Unable to  arise from the wheelchair even with assistance.. Gait not tested.   Reflexes: 1+ and symmetric. Toes downgoing.  NIHSS 3  Modified Rankin 4   ASSESSMENT: 89 year Caucasian lady with a bi-cerebral lacunar infarcts in February 2015 secondary to small vessel disease with vascular risk factors of hypertension, hyperlipidemia, age and coronary artery disease.   PLAN:  I had a long discussion with the patient regarding her recent stroke, and discuss the results of evaluation, tests and answered questions. Continue aspirin for secondary stroke prevention and strict control of hypertension with blood pressure goal below 130/90. Lipids with LDL goal below 100 mg%. Continue ongoing physical and occupational therapy. Return for followup as necessary, no scheduled appointment required.  Rudi Rummage Amenah Tucci, MSN, FNP-BC, A/GNP-C 07/25/2014, 4:33 PM [facility] [address], [place], [place] [address] [phone]  Note: This document was prepared with digital dictation and possible smart phrase technology. Any transcriptional errors that result from this process are unintentional.

## 2014-07-25 NOTE — Patient Instructions (Signed)
Continue aspirin for secondary stroke prevention and strict control of hypertension with blood pressure goal below 130/90.  Lipids with LDL goal below 100 mg%. Continue ongoing physical and occupational therapy. Return for followup as necessary, no scheduled appointment required.

## 2014-07-26 DIAGNOSIS — R41842 Visuospatial deficit: Secondary | ICD-10-CM | POA: Diagnosis not present

## 2014-07-26 DIAGNOSIS — M6281 Muscle weakness (generalized): Secondary | ICD-10-CM | POA: Diagnosis not present

## 2014-07-26 DIAGNOSIS — I69959 Hemiplegia and hemiparesis following unspecified cerebrovascular disease affecting unspecified side: Secondary | ICD-10-CM | POA: Diagnosis not present

## 2014-07-26 DIAGNOSIS — R279 Unspecified lack of coordination: Secondary | ICD-10-CM | POA: Diagnosis not present

## 2014-07-27 DIAGNOSIS — R41842 Visuospatial deficit: Secondary | ICD-10-CM | POA: Diagnosis not present

## 2014-07-27 DIAGNOSIS — I69959 Hemiplegia and hemiparesis following unspecified cerebrovascular disease affecting unspecified side: Secondary | ICD-10-CM | POA: Diagnosis not present

## 2014-07-27 DIAGNOSIS — R279 Unspecified lack of coordination: Secondary | ICD-10-CM | POA: Diagnosis not present

## 2014-07-27 DIAGNOSIS — M6281 Muscle weakness (generalized): Secondary | ICD-10-CM | POA: Diagnosis not present

## 2014-07-27 NOTE — Progress Notes (Signed)
I agree with the above plan 

## 2014-07-28 DIAGNOSIS — I69959 Hemiplegia and hemiparesis following unspecified cerebrovascular disease affecting unspecified side: Secondary | ICD-10-CM | POA: Diagnosis not present

## 2014-07-28 DIAGNOSIS — R41842 Visuospatial deficit: Secondary | ICD-10-CM | POA: Diagnosis not present

## 2014-07-28 DIAGNOSIS — R279 Unspecified lack of coordination: Secondary | ICD-10-CM | POA: Diagnosis not present

## 2014-07-28 DIAGNOSIS — M6281 Muscle weakness (generalized): Secondary | ICD-10-CM | POA: Diagnosis not present

## 2014-07-31 DIAGNOSIS — M6281 Muscle weakness (generalized): Secondary | ICD-10-CM | POA: Diagnosis not present

## 2014-07-31 DIAGNOSIS — R279 Unspecified lack of coordination: Secondary | ICD-10-CM | POA: Diagnosis not present

## 2014-07-31 DIAGNOSIS — R41842 Visuospatial deficit: Secondary | ICD-10-CM | POA: Diagnosis not present

## 2014-07-31 DIAGNOSIS — I69959 Hemiplegia and hemiparesis following unspecified cerebrovascular disease affecting unspecified side: Secondary | ICD-10-CM | POA: Diagnosis not present

## 2014-08-01 ENCOUNTER — Non-Acute Institutional Stay (SKILLED_NURSING_FACILITY): Payer: Medicare Other | Admitting: Nurse Practitioner

## 2014-08-01 DIAGNOSIS — I5032 Chronic diastolic (congestive) heart failure: Secondary | ICD-10-CM

## 2014-08-01 DIAGNOSIS — I69391 Dysphagia following cerebral infarction: Secondary | ICD-10-CM

## 2014-08-01 DIAGNOSIS — K5900 Constipation, unspecified: Secondary | ICD-10-CM | POA: Diagnosis not present

## 2014-08-01 DIAGNOSIS — I69991 Dysphagia following unspecified cerebrovascular disease: Secondary | ICD-10-CM

## 2014-08-01 DIAGNOSIS — I10 Essential (primary) hypertension: Secondary | ICD-10-CM

## 2014-08-01 DIAGNOSIS — J45909 Unspecified asthma, uncomplicated: Secondary | ICD-10-CM | POA: Diagnosis not present

## 2014-08-01 DIAGNOSIS — R279 Unspecified lack of coordination: Secondary | ICD-10-CM | POA: Diagnosis not present

## 2014-08-01 DIAGNOSIS — M6281 Muscle weakness (generalized): Secondary | ICD-10-CM | POA: Diagnosis not present

## 2014-08-01 DIAGNOSIS — I69959 Hemiplegia and hemiparesis following unspecified cerebrovascular disease affecting unspecified side: Secondary | ICD-10-CM | POA: Diagnosis not present

## 2014-08-01 DIAGNOSIS — R41842 Visuospatial deficit: Secondary | ICD-10-CM | POA: Diagnosis not present

## 2014-08-01 NOTE — Progress Notes (Signed)
Patient ID: Sonya [name], female   DOB: [birth year], 80 y.o.   MRN: [id number]   [facility] (31)  Code status: DNR  Chief Complaint  Patient presents with  . Medical Management of Chronic Issues    HPI: This is a 80 y.o. female resident of [place],  residing in [place] section at [facility] who is being seen today for routine follow up on chronic conditions. Pt with a pmh of CAD, hyperlipidemia, dysphagia s/p CVA, constipation, back pain, HTN, CHF, depression and GERD. Pt transited to skilled after rehab from CVA and is doing well in the skilled nursing setting.  Following with cardiology for CAD, CHF and hypertension Had last follow up with neurology last week, no additional follow up recommended. To continue aspirin for secondary stroke prevention and strict control of hypertension with blood pressure goal below 130/90. Lipids with LDL goal below 100 mg%. Pt reports her eyes are the majority of her problem, with ongoing macular degeneration and worsening eye sight after CVA. Went to opthalmology and recently received injection which has improved eye sight for now.     Allergies  Allergen Reactions  . Plavix [Clopidogrel] Nausea And Vomiting  . Statins Other (See Comments)    Leg cramps    MEDICATIONS - Current Outpatient Prescriptions on File Prior to Visit  Medication Sig Dispense Refill  . antiseptic oral rinse (BIOTENE) LIQD 15 mLs by Mouth Rinse route 4 (four) times daily.      Marland Kitchen aspirin 325 MG tablet Take 1 tablet (325 mg total) by mouth daily.      Marland Kitchen FLUoxetine (PROZAC) 20 MG capsule Take 20 mg by mouth daily.      . Fluticasone-Salmeterol (ADVAIR DISKUS) 250-50 MCG/DOSE AEPB Inhale 1 puff into the lungs every 12 (twelve) hours.        . furosemide (LASIX) 20 MG tablet Take 20 mg by mouth daily.      . isosorbide mononitrate (IMDUR) 60 MG 24 hr tablet Take 60 mg by mouth at bedtime.      Marland Kitchen lisinopril  (PRINIVIL,ZESTRIL) 10 MG tablet Take 1 tablet (10 mg total) by mouth daily.      Marland Kitchen loteprednol (LOTEMAX) 0.5 % ophthalmic suspension Place 1 drop into the left eye. One drop in left eye 1-2 times daily as needed      . Maltodextrin-Xanthan Gum (RESOURCE THICKENUP CLEAR) POWD Take 120 g by mouth as needed.      . Menthol, Topical Analgesic, (BENGAY VANISHING SCENT) 2.5 % GEL Apply 1 application topically as directed. Apply to low back area BID and prn to reduce discomfort      . metoprolol succinate (TOPROL-XL) 25 MG 24 hr tablet Take 25 mg by mouth daily.      . Multiple Vitamins-Minerals (ICAPS PO) Take 1 tablet by mouth daily.      Marland Kitchen NEXIUM 40 MG capsule Take 1 capsule by mouth every evening.       . nitroGLYCERIN (NITROSTAT) 0.4 MG SL tablet Place 0.4 mg under the tongue every 5 (five) minutes as needed for chest pain.       Marland Kitchen sennosides-docusate sodium (SENOKOT-S) 8.6-50 MG tablet Take 2 tablets by mouth at bedtime.      . simvastatin (ZOCOR) 20 MG tablet Take 1 tablet (20 mg total) by mouth every other day.       No current facility-administered medications on file prior to visit.     DATA REVIEWED   Laboratory Studies:  Lab Results  Component Value Date   WBC 8.6 02/16/2014   HGB 12.8 02/16/2014   HCT 38 02/16/2014   MCV 91.0 01/31/2014   PLT 334 02/16/2014     Lab Results  Component Value Date   NA 140 05/11/2014   K 4.6 05/11/2014   GLU 86 05/11/2014   BUN 25* 05/11/2014   CREATININE 0.8 05/11/2014   Lab Results  Component Value Date   ALT 23 05/11/2014   AST 17 05/11/2014   ALKPHOS 82 05/11/2014   BILITOT 0.5 01/31/2014   Lab Results  Component Value Date   CHOL 96 05/23/2014   CHOL 235* 02/01/2014   CHOL 180 09/22/2013   Lab Results  Component Value Date   HDL 43 05/23/2014   HDL 69 02/01/2014   HDL 55 09/22/2013   Lab Results  Component Value Date   LDLCALC 37 05/23/2014   LDLCALC 129* 02/01/2014   LDLCALC 92 09/22/2013   Lab Results  Component Value Date   TRIG 78  05/23/2014   TRIG 185* 02/01/2014   TRIG 167* 09/22/2013   Lab Results  Component Value Date   CHOLHDL 3.4 02/01/2014   CHOLHDL 3.1 02/20/2011   Lab Results  Component Value Date   TSH 1.12 09/22/2013     REVIEW OF SYSTEMS  DATA OBTAINED: from patient, medical record,  GENERAL: Feels well. No recent fever, change in appetite or weight.  SKIN: No itch, rash or open wounds  EYES: No eye pain, dryness or itching Nearly Blind  MOUTH/THROAT: No sore throat. Tolerating mechanical soft ground meat diet,  Does cough between meals  RESPIRATORY: No wheezing, SOB  CARDIAC: No chest pain, palpitations No edema.  GI: No abdominal pain No nausea, vomiting,diarrhea. No constipation GU: No dysuria MUSCULOSKELETAL: Low back pain unchanged, managed with BenGay.  NEUROLOGIC: No dizziness, fainting, headache Left sided. Dysphonia persists. No change in mental status.  PSYCHIATRIC: No feelings of anxiety, depression No behavior issue   PHYSICAL EXAM Filed Vitals:   08/01/14 1358  BP: 144/72  Pulse: 64  Temp: 97.5 F (36.4 C)  Resp: 18  Weight: 138 lb (62.596 kg)   Body mass index is 26.95 kg/(m^2).  GENERAL APPEARANCE: No acute distress, appropriately groomed, normal body habitus. Alert, pleasant  SKIN: No diaphoresis, rash, unusual lesions, wounds  HEAD: Normocephalic, atraumatic. EYES: Conjunctiva/lids clear. 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 Lung sounds are CTA CARDIOVASCULAR: Heart RRR. No murmur or extra heart sounds  EDEMA: No peripheral edema. GASTROINTESTINAL: Abdomen is soft, non-tender, not distended w/ normal bowel sounds.  MUSCULOSKELETAL:  Limited ROM, left sided weakness. LUE-4/5, LLE-3/5, RUE-5/5, RLE-4/5 NEUROLOGIC: Oriented to time, place, person.  Slight left facial droop.  PSYCHIATRIC: Mood and affect appropriate to situation, jokes appropriately with me    ASSESSMENT/PLAN  1. Chronic diastolic heart failure -noted weight gain  last month, but no signs of worsening heart failure, no edema or shortness of breath or worsening cough -will cont current medications   2. Essential hypertension -sbp consistently staying over 140s, will increase lisinopril to 20 mg daily at this time, has scheduled BMP schedule   3. Dysphagia S/P CVA (cerebrovascular accident) unchanged  4. Unspecified constipation -controlled currently   5. CAD conts asa, betablocker and statin    LABS- has ordered TSH, BMP, lipid and CBC scheduled

## 2014-08-02 DIAGNOSIS — R41842 Visuospatial deficit: Secondary | ICD-10-CM | POA: Diagnosis not present

## 2014-08-02 DIAGNOSIS — M6281 Muscle weakness (generalized): Secondary | ICD-10-CM | POA: Diagnosis not present

## 2014-08-02 DIAGNOSIS — R279 Unspecified lack of coordination: Secondary | ICD-10-CM | POA: Diagnosis not present

## 2014-08-02 DIAGNOSIS — I69959 Hemiplegia and hemiparesis following unspecified cerebrovascular disease affecting unspecified side: Secondary | ICD-10-CM | POA: Diagnosis not present

## 2014-08-04 DIAGNOSIS — M6281 Muscle weakness (generalized): Secondary | ICD-10-CM | POA: Diagnosis not present

## 2014-08-04 DIAGNOSIS — I69959 Hemiplegia and hemiparesis following unspecified cerebrovascular disease affecting unspecified side: Secondary | ICD-10-CM | POA: Diagnosis not present

## 2014-08-04 DIAGNOSIS — R279 Unspecified lack of coordination: Secondary | ICD-10-CM | POA: Diagnosis not present

## 2014-08-04 DIAGNOSIS — R41842 Visuospatial deficit: Secondary | ICD-10-CM | POA: Diagnosis not present

## 2014-08-07 DIAGNOSIS — M6281 Muscle weakness (generalized): Secondary | ICD-10-CM | POA: Diagnosis not present

## 2014-08-07 DIAGNOSIS — R41842 Visuospatial deficit: Secondary | ICD-10-CM | POA: Diagnosis not present

## 2014-08-07 DIAGNOSIS — R279 Unspecified lack of coordination: Secondary | ICD-10-CM | POA: Diagnosis not present

## 2014-08-07 DIAGNOSIS — I69959 Hemiplegia and hemiparesis following unspecified cerebrovascular disease affecting unspecified side: Secondary | ICD-10-CM | POA: Diagnosis not present

## 2014-08-08 DIAGNOSIS — I69959 Hemiplegia and hemiparesis following unspecified cerebrovascular disease affecting unspecified side: Secondary | ICD-10-CM | POA: Diagnosis not present

## 2014-08-08 DIAGNOSIS — M6281 Muscle weakness (generalized): Secondary | ICD-10-CM | POA: Diagnosis not present

## 2014-08-08 DIAGNOSIS — R279 Unspecified lack of coordination: Secondary | ICD-10-CM | POA: Diagnosis not present

## 2014-08-08 DIAGNOSIS — R41842 Visuospatial deficit: Secondary | ICD-10-CM | POA: Diagnosis not present

## 2014-08-09 ENCOUNTER — Encounter: Payer: Self-pay | Admitting: Nurse Practitioner

## 2014-08-10 DIAGNOSIS — H353290 Exudative age-related macular degeneration, unspecified eye, stage unspecified: Secondary | ICD-10-CM | POA: Diagnosis not present

## 2014-08-10 DIAGNOSIS — R279 Unspecified lack of coordination: Secondary | ICD-10-CM | POA: Diagnosis not present

## 2014-08-10 DIAGNOSIS — Z961 Presence of intraocular lens: Secondary | ICD-10-CM | POA: Diagnosis not present

## 2014-08-10 DIAGNOSIS — R41842 Visuospatial deficit: Secondary | ICD-10-CM | POA: Diagnosis not present

## 2014-08-10 DIAGNOSIS — I69959 Hemiplegia and hemiparesis following unspecified cerebrovascular disease affecting unspecified side: Secondary | ICD-10-CM | POA: Diagnosis not present

## 2014-08-10 DIAGNOSIS — M6281 Muscle weakness (generalized): Secondary | ICD-10-CM | POA: Diagnosis not present

## 2014-08-10 DIAGNOSIS — H18429 Band keratopathy, unspecified eye: Secondary | ICD-10-CM | POA: Diagnosis not present

## 2014-08-11 DIAGNOSIS — I69959 Hemiplegia and hemiparesis following unspecified cerebrovascular disease affecting unspecified side: Secondary | ICD-10-CM | POA: Diagnosis not present

## 2014-08-11 DIAGNOSIS — M6281 Muscle weakness (generalized): Secondary | ICD-10-CM | POA: Diagnosis not present

## 2014-08-11 DIAGNOSIS — R279 Unspecified lack of coordination: Secondary | ICD-10-CM | POA: Diagnosis not present

## 2014-08-11 DIAGNOSIS — R41842 Visuospatial deficit: Secondary | ICD-10-CM | POA: Diagnosis not present

## 2014-08-14 DIAGNOSIS — R279 Unspecified lack of coordination: Secondary | ICD-10-CM | POA: Diagnosis not present

## 2014-08-14 DIAGNOSIS — I69959 Hemiplegia and hemiparesis following unspecified cerebrovascular disease affecting unspecified side: Secondary | ICD-10-CM | POA: Diagnosis not present

## 2014-08-14 DIAGNOSIS — M6281 Muscle weakness (generalized): Secondary | ICD-10-CM | POA: Diagnosis not present

## 2014-08-14 DIAGNOSIS — R41842 Visuospatial deficit: Secondary | ICD-10-CM | POA: Diagnosis not present

## 2014-08-15 DIAGNOSIS — I69959 Hemiplegia and hemiparesis following unspecified cerebrovascular disease affecting unspecified side: Secondary | ICD-10-CM | POA: Diagnosis not present

## 2014-08-15 DIAGNOSIS — R279 Unspecified lack of coordination: Secondary | ICD-10-CM | POA: Diagnosis not present

## 2014-08-15 DIAGNOSIS — M6281 Muscle weakness (generalized): Secondary | ICD-10-CM | POA: Diagnosis not present

## 2014-08-15 DIAGNOSIS — R41842 Visuospatial deficit: Secondary | ICD-10-CM | POA: Diagnosis not present

## 2014-08-16 DIAGNOSIS — R279 Unspecified lack of coordination: Secondary | ICD-10-CM | POA: Diagnosis not present

## 2014-08-16 DIAGNOSIS — I69959 Hemiplegia and hemiparesis following unspecified cerebrovascular disease affecting unspecified side: Secondary | ICD-10-CM | POA: Diagnosis not present

## 2014-08-16 DIAGNOSIS — M6281 Muscle weakness (generalized): Secondary | ICD-10-CM | POA: Diagnosis not present

## 2014-08-16 DIAGNOSIS — R41842 Visuospatial deficit: Secondary | ICD-10-CM | POA: Diagnosis not present

## 2014-08-17 DIAGNOSIS — M6281 Muscle weakness (generalized): Secondary | ICD-10-CM | POA: Diagnosis not present

## 2014-08-17 DIAGNOSIS — R41842 Visuospatial deficit: Secondary | ICD-10-CM | POA: Diagnosis not present

## 2014-08-17 DIAGNOSIS — I69959 Hemiplegia and hemiparesis following unspecified cerebrovascular disease affecting unspecified side: Secondary | ICD-10-CM | POA: Diagnosis not present

## 2014-08-17 DIAGNOSIS — R279 Unspecified lack of coordination: Secondary | ICD-10-CM | POA: Diagnosis not present

## 2014-08-22 DIAGNOSIS — I69920 Aphasia following unspecified cerebrovascular disease: Secondary | ICD-10-CM | POA: Diagnosis not present

## 2014-08-22 DIAGNOSIS — R41842 Visuospatial deficit: Secondary | ICD-10-CM | POA: Diagnosis not present

## 2014-08-22 DIAGNOSIS — I69959 Hemiplegia and hemiparesis following unspecified cerebrovascular disease affecting unspecified side: Secondary | ICD-10-CM | POA: Diagnosis not present

## 2014-08-22 DIAGNOSIS — E7800 Pure hypercholesterolemia, unspecified: Secondary | ICD-10-CM | POA: Diagnosis not present

## 2014-08-22 DIAGNOSIS — M6281 Muscle weakness (generalized): Secondary | ICD-10-CM | POA: Diagnosis not present

## 2014-08-22 DIAGNOSIS — Z79899 Other long term (current) drug therapy: Secondary | ICD-10-CM | POA: Diagnosis not present

## 2014-08-22 DIAGNOSIS — R279 Unspecified lack of coordination: Secondary | ICD-10-CM | POA: Diagnosis not present

## 2014-08-22 DIAGNOSIS — I10 Essential (primary) hypertension: Secondary | ICD-10-CM | POA: Diagnosis not present

## 2014-08-22 LAB — LIPID PANEL
Cholesterol: 110 mg/dL (ref 0–200)
HDL: 48 mg/dL (ref 35–70)
LDL CALC: 47 mg/dL
LDL/HDL RATIO: 2.3
Triglycerides: 77 mg/dL (ref 40–160)

## 2014-08-22 LAB — CBC AND DIFFERENTIAL
HEMATOCRIT: 32 % — AB (ref 36–46)
Hemoglobin: 10.3 g/dL — AB (ref 12.0–16.0)
PLATELETS: 360 10*3/uL (ref 150–399)
WBC: 8.3 10^3/mL

## 2014-08-22 LAB — TSH: TSH: 0.3 u[IU]/mL — AB (ref 0.41–5.90)

## 2014-08-22 LAB — BASIC METABOLIC PANEL
BUN: 26 mg/dL — AB (ref 4–21)
Creatinine: 0.8 mg/dL (ref 0.5–1.1)
Glucose: 80 mg/dL
Potassium: 4.5 mmol/L (ref 3.4–5.3)
SODIUM: 142 mmol/L (ref 137–147)

## 2014-08-25 DIAGNOSIS — I69959 Hemiplegia and hemiparesis following unspecified cerebrovascular disease affecting unspecified side: Secondary | ICD-10-CM | POA: Diagnosis not present

## 2014-08-25 DIAGNOSIS — I69920 Aphasia following unspecified cerebrovascular disease: Secondary | ICD-10-CM | POA: Diagnosis not present

## 2014-08-25 DIAGNOSIS — R279 Unspecified lack of coordination: Secondary | ICD-10-CM | POA: Diagnosis not present

## 2014-08-25 DIAGNOSIS — M6281 Muscle weakness (generalized): Secondary | ICD-10-CM | POA: Diagnosis not present

## 2014-08-25 DIAGNOSIS — R41842 Visuospatial deficit: Secondary | ICD-10-CM | POA: Diagnosis not present

## 2014-08-29 DIAGNOSIS — R41842 Visuospatial deficit: Secondary | ICD-10-CM | POA: Diagnosis not present

## 2014-08-29 DIAGNOSIS — R279 Unspecified lack of coordination: Secondary | ICD-10-CM | POA: Diagnosis not present

## 2014-08-29 DIAGNOSIS — I69959 Hemiplegia and hemiparesis following unspecified cerebrovascular disease affecting unspecified side: Secondary | ICD-10-CM | POA: Diagnosis not present

## 2014-08-29 DIAGNOSIS — I69920 Aphasia following unspecified cerebrovascular disease: Secondary | ICD-10-CM | POA: Diagnosis not present

## 2014-08-29 DIAGNOSIS — M6281 Muscle weakness (generalized): Secondary | ICD-10-CM | POA: Diagnosis not present

## 2014-09-12 DIAGNOSIS — R41842 Visuospatial deficit: Secondary | ICD-10-CM | POA: Diagnosis not present

## 2014-09-12 DIAGNOSIS — F329 Major depressive disorder, single episode, unspecified: Secondary | ICD-10-CM | POA: Diagnosis not present

## 2014-09-12 DIAGNOSIS — R41843 Psychomotor deficit: Secondary | ICD-10-CM | POA: Diagnosis not present

## 2014-09-12 DIAGNOSIS — M6281 Muscle weakness (generalized): Secondary | ICD-10-CM | POA: Diagnosis not present

## 2014-09-13 DIAGNOSIS — R41843 Psychomotor deficit: Secondary | ICD-10-CM | POA: Diagnosis not present

## 2014-09-13 DIAGNOSIS — M6281 Muscle weakness (generalized): Secondary | ICD-10-CM | POA: Diagnosis not present

## 2014-09-13 DIAGNOSIS — R41842 Visuospatial deficit: Secondary | ICD-10-CM | POA: Diagnosis not present

## 2014-09-13 DIAGNOSIS — F329 Major depressive disorder, single episode, unspecified: Secondary | ICD-10-CM | POA: Diagnosis not present

## 2014-09-14 DIAGNOSIS — L299 Pruritus, unspecified: Secondary | ICD-10-CM | POA: Diagnosis not present

## 2014-09-14 DIAGNOSIS — Z85828 Personal history of other malignant neoplasm of skin: Secondary | ICD-10-CM | POA: Diagnosis not present

## 2014-09-14 DIAGNOSIS — L738 Other specified follicular disorders: Secondary | ICD-10-CM | POA: Diagnosis not present

## 2014-09-14 DIAGNOSIS — L723 Sebaceous cyst: Secondary | ICD-10-CM | POA: Diagnosis not present

## 2014-09-14 DIAGNOSIS — L821 Other seborrheic keratosis: Secondary | ICD-10-CM | POA: Diagnosis not present

## 2014-09-15 ENCOUNTER — Encounter: Payer: Self-pay | Admitting: Internal Medicine

## 2014-09-16 LAB — TSH: TSH: 0.4 u[IU]/mL — AB (ref <=5.90)

## 2014-09-18 DIAGNOSIS — Z961 Presence of intraocular lens: Secondary | ICD-10-CM | POA: Diagnosis not present

## 2014-09-18 DIAGNOSIS — H353290 Exudative age-related macular degeneration, unspecified eye, stage unspecified: Secondary | ICD-10-CM | POA: Diagnosis not present

## 2014-09-18 DIAGNOSIS — H18429 Band keratopathy, unspecified eye: Secondary | ICD-10-CM | POA: Diagnosis not present

## 2014-09-19 DIAGNOSIS — R41843 Psychomotor deficit: Secondary | ICD-10-CM | POA: Diagnosis not present

## 2014-09-19 DIAGNOSIS — M6281 Muscle weakness (generalized): Secondary | ICD-10-CM | POA: Diagnosis not present

## 2014-09-19 DIAGNOSIS — Z79899 Other long term (current) drug therapy: Secondary | ICD-10-CM | POA: Diagnosis not present

## 2014-09-19 DIAGNOSIS — I10 Essential (primary) hypertension: Secondary | ICD-10-CM | POA: Diagnosis not present

## 2014-09-19 DIAGNOSIS — E041 Nontoxic single thyroid nodule: Secondary | ICD-10-CM | POA: Diagnosis not present

## 2014-09-19 DIAGNOSIS — F329 Major depressive disorder, single episode, unspecified: Secondary | ICD-10-CM | POA: Diagnosis not present

## 2014-09-19 DIAGNOSIS — R41842 Visuospatial deficit: Secondary | ICD-10-CM | POA: Diagnosis not present

## 2014-09-19 DIAGNOSIS — F3289 Other specified depressive episodes: Secondary | ICD-10-CM | POA: Diagnosis not present

## 2014-09-21 DIAGNOSIS — H3532 Exudative age-related macular degeneration: Secondary | ICD-10-CM | POA: Diagnosis not present

## 2014-09-22 ENCOUNTER — Ambulatory Visit: Payer: Medicare Other | Admitting: Nurse Practitioner

## 2014-09-27 DIAGNOSIS — Z23 Encounter for immunization: Secondary | ICD-10-CM | POA: Diagnosis not present

## 2014-09-28 DIAGNOSIS — R41843 Psychomotor deficit: Secondary | ICD-10-CM | POA: Diagnosis not present

## 2014-09-28 DIAGNOSIS — M6281 Muscle weakness (generalized): Secondary | ICD-10-CM | POA: Diagnosis not present

## 2014-09-28 DIAGNOSIS — F338 Other recurrent depressive disorders: Secondary | ICD-10-CM | POA: Diagnosis not present

## 2014-09-28 DIAGNOSIS — R41842 Visuospatial deficit: Secondary | ICD-10-CM | POA: Diagnosis not present

## 2014-10-04 DIAGNOSIS — R41843 Psychomotor deficit: Secondary | ICD-10-CM | POA: Diagnosis not present

## 2014-10-04 DIAGNOSIS — R41842 Visuospatial deficit: Secondary | ICD-10-CM | POA: Diagnosis not present

## 2014-10-04 DIAGNOSIS — F338 Other recurrent depressive disorders: Secondary | ICD-10-CM | POA: Diagnosis not present

## 2014-10-04 DIAGNOSIS — M6281 Muscle weakness (generalized): Secondary | ICD-10-CM | POA: Diagnosis not present

## 2014-10-11 DIAGNOSIS — F338 Other recurrent depressive disorders: Secondary | ICD-10-CM | POA: Diagnosis not present

## 2014-10-11 DIAGNOSIS — M6281 Muscle weakness (generalized): Secondary | ICD-10-CM | POA: Diagnosis not present

## 2014-10-11 DIAGNOSIS — R41842 Visuospatial deficit: Secondary | ICD-10-CM | POA: Diagnosis not present

## 2014-10-11 DIAGNOSIS — R41843 Psychomotor deficit: Secondary | ICD-10-CM | POA: Diagnosis not present

## 2014-10-17 ENCOUNTER — Non-Acute Institutional Stay (SKILLED_NURSING_FACILITY): Payer: Medicare Other | Admitting: Nurse Practitioner

## 2014-10-17 DIAGNOSIS — I5032 Chronic diastolic (congestive) heart failure: Secondary | ICD-10-CM | POA: Diagnosis not present

## 2014-10-17 DIAGNOSIS — N8110 Cystocele, unspecified: Secondary | ICD-10-CM

## 2014-10-17 DIAGNOSIS — K449 Diaphragmatic hernia without obstruction or gangrene: Secondary | ICD-10-CM

## 2014-10-17 DIAGNOSIS — E785 Hyperlipidemia, unspecified: Secondary | ICD-10-CM

## 2014-10-17 DIAGNOSIS — I69391 Dysphagia following cerebral infarction: Secondary | ICD-10-CM

## 2014-10-17 DIAGNOSIS — K5900 Constipation, unspecified: Secondary | ICD-10-CM

## 2014-10-17 DIAGNOSIS — I2510 Atherosclerotic heart disease of native coronary artery without angina pectoris: Secondary | ICD-10-CM

## 2014-10-17 NOTE — Progress Notes (Signed)
Patient ID: Sonya [name], female   DOB: February 05, 1913, 80 y.o.   MRN: [id number]    Nursing Home Location:  [facility]   Place of Service: SNF (31)  PCP: Estill Dooms, MD  Allergies  Allergen Reactions  . Plavix [Clopidogrel] Nausea And Vomiting  . Statins Other (See Comments)    Leg cramps    Chief Complaint  Patient presents with  . Medical Management of Chronic Issues    HPI:  Patient is a 80 y.o. female seen today at [place] section at [facility] who is being seen today for routine follow up on chronic conditions. Pt with a pmh of CAD, hyperlipidemia, dysphagia s/p CVA, constipation, back pain, HTN, CHF, depression and GERD. pt with ongoing chronic cough. Has follow up with pulmonologist due to this. Also plans to follow up with GYN and cardiology next month. There has been no acute issues. Has done well in skilled section.   Review of Systems:  Review of Systems  Constitutional: Negative for fever, activity change and appetite change.  HENT: Negative for congestion, facial swelling, postnasal drip, sneezing, trouble swallowing and voice change.   Respiratory: Positive for cough. Negative for shortness of breath and wheezing.   Cardiovascular: Negative for chest pain.  Gastrointestinal: Negative for nausea, diarrhea and constipation.  Genitourinary: Negative for dysuria and difficulty urinating.  Musculoskeletal: Negative for arthralgias and neck pain.  Skin: Negative for color change.  Neurological: Positive for weakness (chronic). Negative for dizziness and seizures.  Psychiatric/Behavioral: Negative for behavioral problems, confusion and sleep disturbance.    Past Medical History  Diagnosis Date  . Acute bronchitis   . Asthma   . TIA (transient ischemic attack)   . Herpes zoster complication 4098    complicated, left eye involvement  . Nontoxic uninodular goiter     negative [place] 2008  . Macular  degeneration (senile) of retina, unspecified 2012    wet  . Acute myocardial infarction, unspecified site, episode of care unspecified 03/07/11  . Coronary atherosclerosis of native coronary artery      s/p stent diagonal branch 1996; RCA 03/07/11  . Chronic diastolic heart failure   . Cardiomegaly   . Diaphragmatic hernia without mention of obstruction or gangrene   . Irritable bowel syndrome   . Other specified genital prolapse(618.89)     corrected with pessary  . Edema 2006  . Other abnormal blood chemistry   . Debility, unspecified   . Open wound of knee, leg (except thigh), and ankle, without mention of complication 10/9146    left leg, complicated wound, extended healing, resolved 01/2013  . Transient ischemic attack (TIA), and cerebral infarction without residual deficits(V12.54) 2008  . Shoulder pain 03/30/2013  . Hypertension   . Depression   . Finger numbness 12/28/2013    Left thumb, 1, 2nd fingers  . Dysphagia S/P CVA (cerebrovascular accident) 02/14/2014  . CHF (congestive heart failure)   . Stroke    Past Surgical History  Procedure Laterality Date  . Appendectomy    . Tonsillectomy and adenoidectomy    . Cataract extraction    . Cardiac catheterization  1996, 2001, 2012  . Carotid stent      diagonal branch of LCA   Social History:   reports that she quit smoking about 50 years ago. She has never used smokeless tobacco. She reports that she does not drink alcohol or use illicit drugs.  Family History  Problem Relation Age of Onset  .  Diabetes Sister   . Cancer Mother   . Stroke Father   . Diabetes Father   . Dementia Sister     Medications: Patient's Medications  New Prescriptions   No medications on file  Previous Medications   ASPIRIN 325 MG TABLET    Take 1 tablet (325 mg total) by mouth daily.   BACLOFEN PO    Take 5 mg by mouth. Take one three times daily   ESOMEPRAZOLE (NEXIUM) 40 MG CAPSULE    Take 40 mg by mouth daily at 12 noon.   FLUOXETINE  (PROZAC) 20 MG CAPSULE    Take 10 mg by mouth daily.    FLUTICASONE-SALMETEROL (ADVAIR DISKUS) 250-50 MCG/DOSE AEPB    Inhale 1 puff into the lungs every 12 (twelve) hours.     FUROSEMIDE (LASIX) 20 MG TABLET    Take 20 mg by mouth daily.   ISOSORBIDE MONONITRATE (IMDUR) 60 MG 24 HR TABLET    Take 60 mg by mouth at bedtime.   LOSARTAN (COZAAR) 50 MG TABLET    Take 50 mg by mouth daily.   LOTEPREDNOL (LOTEMAX) 0.5 % OPHTHALMIC SUSPENSION    Place 1 drop into the left eye. One drop in left eye 1-2 times daily as needed   METOPROLOL SUCCINATE (TOPROL-XL) 25 MG 24 HR TABLET    Take 25 mg by mouth daily.   MULTIPLE VITAMINS-MINERALS (ICAPS PO)    Take 1 tablet by mouth daily.   NITROGLYCERIN (NITROSTAT) 0.4 MG SL TABLET    Place 0.4 mg under the tongue every 5 (five) minutes as needed for chest pain.    SIMVASTATIN (ZOCOR) 20 MG TABLET    Take 1 tablet (20 mg total) by mouth every other day.  Modified Medications   No medications on file  Discontinued Medications   LISINOPRIL (PRINIVIL,ZESTRIL) 10 MG TABLET    Take 1 tablet (10 mg total) by mouth daily.   NEXIUM 40 MG CAPSULE    Take 1 capsule by mouth every evening.      Physical Exam: Filed Vitals:   10/17/14 0946  BP: 121/76  Pulse: 75  Temp: 97.7 F (36.5 C)  Resp: 18  Weight: 137 lb (62.143 kg)    Physical Exam  Constitutional: She is oriented to person, place, and time. No distress.  Frail female  HENT:  Head: Normocephalic and atraumatic.  Nose: Nose normal.  Mouth/Throat: Oropharynx is clear and moist. No oropharyngeal exudate.  Eyes: Conjunctivae are normal. Pupils are equal, round, and reactive to light.  Neck: Normal range of motion. Neck supple.  Cardiovascular: Normal rate, regular rhythm and normal heart sounds.   Pulmonary/Chest: Effort normal. She has decreased breath sounds.  Abdominal: Soft. Bowel sounds are normal.  Musculoskeletal: She exhibits no edema and no tenderness.  Uses motorized WC  Neurological: She  is alert and oriented to person, place, and time.  Skin: Skin is warm and dry. She is not diaphoretic.  Psychiatric: She has a normal mood and affect.    Labs reviewed: Basic Metabolic Panel:  Recent Labs  02/02/14 0540 02/03/14 0600 02/04/14 0830  04/25/14 05/11/14 08/22/14  NA 138 141 144  < > 141 140 142  K 4.2 5.0 4.2  < > 4.1 4.6 4.5  CL 103 105 107  --   --   --   --   CO2 23 25 24   --   --   --   --   GLUCOSE 122* 133* 149*  --   --   --   --  BUN 25* 25* 25*  < > 21 25* 26*  CREATININE 0.77 0.79 0.61  < > 0.9 0.8 0.8  CALCIUM 8.7 8.8 9.0  --   --   --   --   < > = values in this interval not displayed. Liver Function Tests:  Recent Labs  01/31/14 1110 02/16/14 05/11/14  AST 19 17 17   ALT 19 15 23   ALKPHOS 100 84 82  BILITOT 0.5  --   --   PROT 7.6  --   --   ALBUMIN 3.7  --   --    No results found for this basename: LIPASE, AMYLASE,  in the last 8760 hours No results found for this basename: AMMONIA,  in the last 8760 hours CBC:  Recent Labs  01/31/14 1110 02/16/14 08/22/14  WBC 10.2 8.6 8.3  NEUTROABS 7.8*  --   --   HGB 14.1 12.8 10.3*  HCT 42.4 38 32*  MCV 91.0  --   --   PLT 356 334 360   TSH:  Recent Labs  08/22/14 09/16/14  TSH 0.30* 0.40*   A1C: Lab Results  Component Value Date   HGBA1C 6.1* 02/01/2014   Lipid Panel:  Recent Labs  02/01/14 0448 05/23/14 08/22/14  CHOL 235* 96 110  HDL 69 43 48  LDLCALC 129* 37 47  TRIG 185* 78 77  CHOLHDL 3.4  --   --       Assessment/Plan 1. Hiatal hernia -conts on nexium, no symptoms at this time  2. Chronic diastolic heart failure -conts on lasix, coreg, and losartan has follow up with cardiology next month  3. Coronary artery disease involving native coronary artery of native heart without angina pectoris -without chest pains, conts imdur ASA  4. Dysphagia S/P CVA (cerebrovascular accident) -ST following and conts with recs, with chronic cough that is possibility from  dysphagia  5. Constipation, unspecified constipation type -stable at this time  6. Female bladder prolapse -currently following with GYN, has had a pessary for years  7. Hyperlipemia Lipids checked last month. LDL at goal  8. Chronic cough -on-going, ACE also changed to ARB however with out change. Possibility due to dysphagia and aspiration,  Has appt with pulmonary for further evaluation   9. Anemia Will follow up cbc

## 2014-10-19 DIAGNOSIS — I10 Essential (primary) hypertension: Secondary | ICD-10-CM | POA: Diagnosis not present

## 2014-10-19 LAB — CBC AND DIFFERENTIAL
HCT: 29 % — AB (ref 36–46)
HEMOGLOBIN: 9.8 g/dL — AB (ref 12.0–16.0)
PLATELETS: 413 10*3/uL — AB (ref 150–399)
WBC: 8.6 10^3/mL

## 2014-10-26 ENCOUNTER — Encounter: Payer: Self-pay | Admitting: Emergency Medicine

## 2014-10-26 ENCOUNTER — Ambulatory Visit (INDEPENDENT_AMBULATORY_CARE_PROVIDER_SITE_OTHER): Payer: Medicare Other | Admitting: Emergency Medicine

## 2014-10-26 VITALS — BP 128/64 | HR 62 | Temp 98.2°F | Ht 60.0 in | Wt 135.0 lb

## 2014-10-26 DIAGNOSIS — I2510 Atherosclerotic heart disease of native coronary artery without angina pectoris: Secondary | ICD-10-CM

## 2014-10-26 DIAGNOSIS — R05 Cough: Secondary | ICD-10-CM

## 2014-10-26 DIAGNOSIS — R059 Cough, unspecified: Secondary | ICD-10-CM

## 2014-10-26 NOTE — Assessment & Plan Note (Signed)
The cough has been problematic since her stroke in February and I suspect that it relates to poor coordination of her posterior pharynx. She is at risk for aspiration and has been placed on a modified diet which she only follows in part. She carries a history of asthma and has been on Advair for many years. It is not clear to me that she is even delivering the Advair effectively after her stroke. I have asked her to stop the Advair to see if her cough improves. If her breathing changes off the medication we could always choose a substitute. She will let me know in 1 month if she is tolerated this change. The issue of her swallowing is more complicated. There is a philosophical component to whether or not she decides to comply with the modified diet. I explained to her that she may be at increased risk of coughing and even pneumonia if she does not use thickened liquids, but that it is ultimately her decision as to whether it's worth it to accept these risks. Finally although the ARB's are certainly less likely to cause cough then the ace inhibitors, losartan is a 1 ARB that has been more associated with cough. May be beneficial to change her to an alternative. I have asked her to discuss this with Dr. Nyoka Cowden

## 2014-10-26 NOTE — Patient Instructions (Signed)
Please stop Advair now Continue to work with speech therapy on the appropriate diet consistency. Some of her cough may be due to poor coordination in your throat following your stroke. You may benefit from using thickener in all your liquids There may be a better blood pressure medicine that would be less likely to contribute to cough. Please discuss this with Dr. Nyoka Cowden when you're able.  Follow with Dr Lamonte Sakai in 1 month

## 2014-10-26 NOTE — Progress Notes (Signed)
Subjective:    Patient ID: Sonya [name], female    DOB: June 14, 1913, 80 y.o.   MRN: [id number]  HPI 80 yo woman, former smoker, hx of CAD, diastolic CHF, HTN, CVA c/b dysphagia 2/'15. She is supposed to be on a modified diet, doesn't always use the thickened liquids. Carries a hx of asthma made many years ago. She has been on Advair for many years. She coughs with swallowing. She is unsure whether she would miss the Advair. She was started on losartan    Review of Systems  Constitutional: Negative for fever and unexpected weight change.  HENT: Negative for congestion, dental problem, ear pain, nosebleeds, postnasal drip, rhinorrhea, sinus pressure, sneezing, sore throat and trouble swallowing.   Eyes: Negative for redness and itching.  Respiratory: Positive for cough, choking and wheezing. Negative for chest tightness and shortness of breath.   Cardiovascular: Negative for palpitations and leg swelling.  Gastrointestinal: Negative for nausea and vomiting.  Genitourinary: Negative for dysuria.  Musculoskeletal: Negative for joint swelling.  Skin: Negative for rash.  Neurological: Negative for headaches.  Hematological: Does not bruise/bleed easily.  Psychiatric/Behavioral: Negative for dysphoric mood. The patient is not nervous/anxious.    Past Medical History  Diagnosis Date  . Acute bronchitis   . Asthma   . TIA (transient ischemic attack)   . Herpes zoster complication 1093    complicated, left eye involvement  . Nontoxic uninodular goiter     negative [place] 2008  . Macular degeneration (senile) of retina, unspecified 2012    wet  . Acute myocardial infarction, unspecified site, episode of care unspecified 03/07/11  . Coronary atherosclerosis of native coronary artery      s/p stent diagonal branch 1996; RCA 03/07/11  . Chronic diastolic heart failure   . Cardiomegaly   . Diaphragmatic hernia without mention of obstruction or gangrene   . Irritable bowel syndrome   . Other  specified genital prolapse(618.89)     corrected with pessary  . Edema 2006  . Other abnormal blood chemistry   . Debility, unspecified   . Open wound of knee, leg (except thigh), and ankle, without mention of complication 23/5573    left leg, complicated wound, extended healing, resolved 01/2013  . Transient ischemic attack (TIA), and cerebral infarction without residual deficits(V12.54) 2008  . Shoulder pain 03/30/2013  . Hypertension   . Depression   . Finger numbness 12/28/2013    Left thumb, 1, 2nd fingers  . Dysphagia S/P CVA (cerebrovascular accident) 02/14/2014  . CHF (congestive heart failure)   . Stroke      Family History  Problem Relation Age of Onset  . Diabetes Sister   . Cancer Mother   . Stroke Father   . Diabetes Father   . Dementia Sister      History   Social History  . Marital Status: Widowed    Spouse Name: N/A    Number of Children: 0  . Years of Education: N/A   Occupational History  . 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 Smoking status: Former Smoker    Quit date: 12/23/1963  . Smokeless tobacco: Never Used  . Alcohol Use: No  . Drug Use: No  . Sexual Activity: No   Other Topics Concern  . Not on file   Social History Narrative      Resides at [facility] since 2009. Retired Microbiologist  Widowed with no children.    Living Will, DNR   Exercises predominantly walking about a mile daily   Does not drive   Caffeine beverages occasional   Stopped smoking 1965   1/2 glass of wine almost daily      Allergies  Allergen Reactions  . Plavix [Clopidogrel] Nausea And Vomiting  . Statins Other (See Comments)    Leg cramps     Outpatient Prescriptions Prior to Visit  Medication Sig Dispense Refill  . aspirin 325 MG tablet Take 1 tablet (325 mg total) by mouth daily.    Marland Kitchen esomeprazole (NEXIUM) 40 MG capsule Take 40 mg by mouth daily at 12 noon.    .  Fluticasone-Salmeterol (ADVAIR DISKUS) 250-50 MCG/DOSE AEPB Inhale 1 puff into the lungs every 12 (twelve) hours.      . furosemide (LASIX) 20 MG tablet Take 20 mg by mouth daily.    . isosorbide mononitrate (IMDUR) 60 MG 24 hr tablet Take 60 mg by mouth at bedtime.    Marland Kitchen losartan (COZAAR) 50 MG tablet Take 50 mg by mouth daily.    Marland Kitchen loteprednol (LOTEMAX) 0.5 % ophthalmic suspension Place 1 drop into the left eye. One drop in left eye 1-2 times daily as needed    . metoprolol succinate (TOPROL-XL) 25 MG 24 hr tablet Take 25 mg by mouth daily.    . Multiple Vitamins-Minerals (ICAPS PO) Take 1 tablet by mouth daily.    . nitroGLYCERIN (NITROSTAT) 0.4 MG SL tablet Place 0.4 mg under the tongue every 5 (five) minutes as needed for chest pain.     . simvastatin (ZOCOR) 20 MG tablet Take 1 tablet (20 mg total) by mouth every other day.    Marland Kitchen BACLOFEN PO Take 5 mg by mouth. Take one three times daily    . FLUoxetine (PROZAC) 20 MG capsule Take 10 mg by mouth daily.      No facility-administered medications prior to visit.         Objective:   Physical Exam Filed Vitals:   10/26/14 1531  BP: 128/64  Pulse: 62  Temp: 98.2 F (36.8 C)  TempSrc: Oral  Height: 5' (1.524 m)  Weight: 135 lb (61.236 kg)  SpO2: 95%   Gen: Pleasant, elderly kyphotic woman, in no distress,  normal affect, sitting in wheelchair. She does occasionally have cough  ENT: No lesions,  mouth clear,  oropharynx clear, no postnasal drip  Neck: No JVD, no TMG, no carotid bruits  Lungs: No use of accessory muscles, clear without rales or rhonchi  Cardiovascular: RRR, heart sounds normal, no murmur or gallops, no peripheral edema  Musculoskeletal: No deformities, no cyanosis or clubbing  Neuro: alert, non focal  Skin: Warm, no lesions or rashes      Assessment & Plan:  Cough The cough has been problematic since her stroke in February and I suspect that it relates to poor coordination of her posterior pharynx. She  is at risk for aspiration and has been placed on a modified diet which she only follows in part. She carries a history of asthma and has been on Advair for many years. It is not clear to me that she is even delivering the Advair effectively after her stroke. I have asked her to stop the Advair to see if her cough improves. If her breathing changes off the medication we could always choose a substitute. She will let me know in 1 month if she is tolerated this change. The issue of  her swallowing is more complicated. There is a philosophical component to whether or not she decides to comply with the modified diet. I explained to her that she may be at increased risk of coughing and even pneumonia if she does not use thickened liquids, but that it is ultimately her decision as to whether it's worth it to accept these risks. Finally although the ARB's are certainly less likely to cause cough then the ace inhibitors, losartan is a 1 ARB that has been more associated with cough. May be beneficial to change her to an alternative. I have asked her to discuss this with Dr. Nyoka Cowden

## 2014-10-31 ENCOUNTER — Encounter: Payer: Self-pay | Admitting: Cardiology

## 2014-10-31 ENCOUNTER — Ambulatory Visit (INDEPENDENT_AMBULATORY_CARE_PROVIDER_SITE_OTHER): Payer: Medicare Other | Admitting: Cardiology

## 2014-10-31 VITALS — BP 130/72 | HR 63 | Ht 60.0 in | Wt 135.0 lb

## 2014-10-31 DIAGNOSIS — I10 Essential (primary) hypertension: Secondary | ICD-10-CM | POA: Diagnosis not present

## 2014-10-31 DIAGNOSIS — I5032 Chronic diastolic (congestive) heart failure: Secondary | ICD-10-CM

## 2014-10-31 DIAGNOSIS — I2510 Atherosclerotic heart disease of native coronary artery without angina pectoris: Secondary | ICD-10-CM | POA: Diagnosis not present

## 2014-10-31 DIAGNOSIS — I2583 Coronary atherosclerosis due to lipid rich plaque: Principal | ICD-10-CM

## 2014-10-31 NOTE — Patient Instructions (Signed)
The current medical regimen is effective;  continue present plan and medications.  Follow up in 6 months with Dr. Skains.  You will receive a letter in the mail 2 months before you are due.  Please call us when you receive this letter to schedule your follow up appointment.  Thank you for choosing [facility]!!     

## 2014-10-31 NOTE — Progress Notes (Signed)
Sonya [name]. [address] [place], [place]  [address] Phone: [phone] Fax:  [phone]  Date:  10/31/2014   ID:  Sonya [name], DOB May 13, 1913, MRN [id number]  PCP:  Estill Dooms, MD   History of Present Illness: Sonya [name] is a 80 y.o. female with coronary artery disease status post ST elevation myocardial infarction receiving an RCA stent with residual moderate LAD disease and obtuse marginal disease here for followup.  CAD-stable with minor exertional anginal symptoms. She will occasionally have fleeting chest discomfort, this is usually relieved by burping. In the past her chest discomfort post catheterization/stent placement was relieved with Nexium.   Stroke-unfortunately suffered a stroke in early 2015. Dr. Leonie Man. Dysarthria has improved slightly. Dysphagia has improved. Left-sided facial droop has improved. Aspirin  Hyperlipidemia-in the past, she has not tolerated statins due to leg weakness.  GERD-doing well with Nexium. Seen Dr. Lamonte Sakai for chronic aspiration.    Wt Readings from Last 3 Encounters:  10/31/14 135 lb (61.236 kg)  10/26/14 135 lb (61.236 kg)  10/17/14 137 lb (62.143 kg)     Past Medical History  Diagnosis Date  . Acute bronchitis   . Asthma   . TIA (transient ischemic attack)   . Herpes zoster complication 8563    complicated, left eye involvement  . Nontoxic uninodular goiter     negative [place] 2008  . Macular degeneration (senile) of retina, unspecified 2012    wet  . Acute myocardial infarction, unspecified site, episode of care unspecified 03/07/11  . Coronary atherosclerosis of native coronary artery      s/p stent diagonal branch 1996; RCA 03/07/11  . Chronic diastolic heart failure   . Cardiomegaly   . Diaphragmatic hernia without mention of obstruction or gangrene   . Irritable bowel syndrome   . Other specified genital prolapse(618.89)     corrected with pessary  . Edema 2006  . Other abnormal blood chemistry    . Debility, unspecified   . Open wound of knee, leg (except thigh), and ankle, without mention of complication 14/9702    left leg, complicated wound, extended healing, resolved 01/2013  . Transient ischemic attack (TIA), and cerebral infarction without residual deficits(V12.54) 2008  . Shoulder pain 03/30/2013  . Hypertension   . Depression   . Finger numbness 12/28/2013    Left thumb, 1, 2nd fingers  . Dysphagia S/P CVA (cerebrovascular accident) 02/14/2014  . CHF (congestive heart failure)   . Stroke     Past Surgical History  Procedure Laterality Date  . Appendectomy    . Tonsillectomy and adenoidectomy    . Cataract extraction    . Cardiac catheterization  1996, 2001, 2012  . Carotid stent      diagonal branch of LCA    Current Outpatient Prescriptions  Medication Sig Dispense Refill  . aspirin 325 MG tablet Take 1 tablet (325 mg total) by mouth daily.    Marland Kitchen esomeprazole (NEXIUM) 40 MG capsule Take 40 mg by mouth daily at 12 noon.    . Fluticasone-Salmeterol (ADVAIR DISKUS) 250-50 MCG/DOSE AEPB Inhale 1 puff into the lungs every 12 (twelve) hours.      . furosemide (LASIX) 20 MG tablet Take 20 mg by mouth daily.    . isosorbide mononitrate (IMDUR) 60 MG 24 hr tablet Take 60 mg by mouth at bedtime.    Marland Kitchen loteprednol (LOTEMAX) 0.5 % ophthalmic suspension Place 1 drop into the left eye. One drop  in left eye 1-2 times daily as needed    . metoprolol succinate (TOPROL-XL) 25 MG 24 hr tablet Take 25 mg by mouth daily.    . Multiple Vitamins-Minerals (ICAPS PO) Take 1 tablet by mouth daily.    . nitroGLYCERIN (NITROSTAT) 0.4 MG SL tablet Place 0.4 mg under the tongue every 5 (five) minutes as needed for chest pain.     . simvastatin (ZOCOR) 20 MG tablet Take 1 tablet (20 mg total) by mouth every other day.     No current facility-administered medications for this visit.    Allergies:    Allergies  Allergen Reactions  . Plavix [Clopidogrel] Nausea And Vomiting  . Statins Other (See  Comments)    Leg cramps    Social History:  The patient  reports that she quit smoking about 50 years ago. She has never used smokeless tobacco. She reports that she does not drink alcohol or use illicit drugs.   ROS:  Please see the history of present illness.   Denies any fevers, chills. Recent leg wound. Occasional rare chest pain.   PHYSICAL EXAM: VS:  BP 130/72 mmHg  Pulse 63  Ht 5' (1.524 m)  Wt 135 lb (61.236 kg)  BMI 26.37 kg/m2 Well nourished, well developed, in no acute distress elderly,in wheelchair. HEENT: normal Neck: no JVD Cardiac:  normal S1, S2; RRR; no murmur Lungs:  clear to auscultation bilaterally, no wheezing, rhonchi or rales Abd: soft, nontender, no hepatomegaly Ext: no edema Skin: Right leg improved no edema. Neuro: Very minimal left facial droop. Dysarthria noted.  EKG:  Normal rhythm 63, T wave inversion inferior leads mostly but scattered throughout EKG.     ASSESSMENT AND PLAN:  1. Stroke-01/2014- symptom was trouble speaking, dysarthria. Dysphagia and facial droop have improved. Aspirin. Seen Dr. Lamonte Sakai for aspiration pneumonia. Thickened liquids. Stopped Advair and this seemed to help as well. 2. Coronary artery disease status post MI-overall doing well. Minor stable angina. Continue with isosorbide. Aspirin. She has been taken off of her Crestor. Lipids have been excellent. Given her advanced age, had no problems with that. 3. Hypertension-improved. Was previously higher in the morning hours. Medications reviewed. 4. Chronic diastolic heart failure-doing well, stable. No shortness of breath. 5. See her back in 6 months  Signed, Candee Furbish, MD [facility]  10/31/2014 3:37 PM

## 2014-11-02 DIAGNOSIS — H3532 Exudative age-related macular degeneration: Secondary | ICD-10-CM | POA: Diagnosis not present

## 2014-11-07 DIAGNOSIS — N8182 Incompetence or weakening of pubocervical tissue: Secondary | ICD-10-CM | POA: Diagnosis not present

## 2014-11-20 DIAGNOSIS — I69391 Dysphagia following cerebral infarction: Secondary | ICD-10-CM | POA: Diagnosis not present

## 2014-11-20 DIAGNOSIS — R1312 Dysphagia, oropharyngeal phase: Secondary | ICD-10-CM | POA: Diagnosis not present

## 2014-11-21 DIAGNOSIS — I69391 Dysphagia following cerebral infarction: Secondary | ICD-10-CM | POA: Diagnosis not present

## 2014-11-21 DIAGNOSIS — R1312 Dysphagia, oropharyngeal phase: Secondary | ICD-10-CM | POA: Diagnosis not present

## 2014-11-23 ENCOUNTER — Non-Acute Institutional Stay (SKILLED_NURSING_FACILITY): Payer: Medicare Other | Admitting: Adult Health

## 2014-11-23 ENCOUNTER — Encounter: Payer: Self-pay | Admitting: Emergency Medicine

## 2014-11-23 ENCOUNTER — Ambulatory Visit (INDEPENDENT_AMBULATORY_CARE_PROVIDER_SITE_OTHER): Payer: Medicare Other | Admitting: Emergency Medicine

## 2014-11-23 ENCOUNTER — Encounter: Payer: Self-pay | Admitting: Adult Health

## 2014-11-23 VITALS — BP 138/60 | HR 63 | Ht 60.0 in

## 2014-11-23 DIAGNOSIS — E785 Hyperlipidemia, unspecified: Secondary | ICD-10-CM | POA: Diagnosis not present

## 2014-11-23 DIAGNOSIS — R059 Cough, unspecified: Secondary | ICD-10-CM

## 2014-11-23 DIAGNOSIS — R05 Cough: Secondary | ICD-10-CM

## 2014-11-23 DIAGNOSIS — J3489 Other specified disorders of nose and nasal sinuses: Secondary | ICD-10-CM | POA: Diagnosis not present

## 2014-11-23 DIAGNOSIS — J45909 Unspecified asthma, uncomplicated: Secondary | ICD-10-CM | POA: Diagnosis not present

## 2014-11-23 DIAGNOSIS — I5032 Chronic diastolic (congestive) heart failure: Secondary | ICD-10-CM

## 2014-11-23 DIAGNOSIS — I639 Cerebral infarction, unspecified: Secondary | ICD-10-CM

## 2014-11-23 DIAGNOSIS — I69391 Dysphagia following cerebral infarction: Secondary | ICD-10-CM

## 2014-11-23 DIAGNOSIS — I2510 Atherosclerotic heart disease of native coronary artery without angina pectoris: Secondary | ICD-10-CM | POA: Diagnosis not present

## 2014-11-23 NOTE — Progress Notes (Signed)
Subjective:    Patient ID: Sonya [name], female    DOB: 06-Dec-1913, 80 y.o.   MRN: [id number]  HPI 80 yo woman, former smoker, hx of CAD, diastolic CHF, HTN, CVA c/b dysphagia 2/'15. She is supposed to be on a modified diet, doesn't always use the thickened liquids. Carries a hx of asthma made many years ago. She has been on Advair for many years. She coughs with swallowing. She is unsure whether she would miss the Advair. She was started on losartan   ROV 11/23/14 -- follow up visit for cough concerning for aspiration. Also considered a contribution from Advair, losartan. She was initially better after the Advair was stopped - less cough. She is having more for that last couple days for some reason. She doesn't recall any recent aspiration event. She is doing swallowing eval and strengthening exercises. She is still on losartan.    Review of Systems  Constitutional: Negative for fever and unexpected weight change.  HENT: Negative for congestion, dental problem, ear pain, nosebleeds, postnasal drip, rhinorrhea, sinus pressure, sneezing, sore throat and trouble swallowing.   Eyes: Negative for redness and itching.  Respiratory: Positive for cough, choking and wheezing. Negative for chest tightness and shortness of breath.   Cardiovascular: Negative for palpitations and leg swelling.  Gastrointestinal: Negative for nausea and vomiting.  Genitourinary: Negative for dysuria.  Musculoskeletal: Negative for joint swelling.  Skin: Negative for rash.  Neurological: Negative for headaches.  Hematological: Does not bruise/bleed easily.  Psychiatric/Behavioral: Negative for dysphoric mood. The patient is not nervous/anxious.    Past Medical History  Diagnosis Date  . Acute bronchitis   . Asthma   . TIA (transient ischemic attack)   . Herpes zoster complication 3536    complicated, left eye involvement  . Nontoxic uninodular goiter     negative [place] 2008  . Macular degeneration (senile) of  retina, unspecified 2012    wet  . Acute myocardial infarction, unspecified site, episode of care unspecified 03/07/11  . Coronary atherosclerosis of native coronary artery      s/p stent diagonal branch 1996; RCA 03/07/11  . Chronic diastolic heart failure   . Cardiomegaly   . Diaphragmatic hernia without mention of obstruction or gangrene   . Irritable bowel syndrome   . Other specified genital prolapse(618.89)     corrected with pessary  . Edema 2006  . Other abnormal blood chemistry   . Debility, unspecified   . Open wound of knee, leg (except thigh), and ankle, without mention of complication 14/4315    left leg, complicated wound, extended healing, resolved 01/2013  . Transient ischemic attack (TIA), and cerebral infarction without residual deficits(V12.54) 2008  . Shoulder pain 03/30/2013  . Hypertension   . Depression   . Finger numbness 12/28/2013    Left thumb, 1, 2nd fingers  . Dysphagia S/P CVA (cerebrovascular accident) 02/14/2014  . CHF (congestive heart failure)   . Stroke      Family History  Problem Relation Age of Onset  . Diabetes Sister   . Cancer Mother   . Stroke Father   . Diabetes Father   . Dementia Sister      History   Social History  . Marital Status: Widowed    Spouse Name: N/A    Number of Children: 0  . Years of Education: N/A   Occupational History  . 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 Smoking status: Former Smoker    Quit date: 12/23/1963  . Smokeless tobacco: Never Used  . Alcohol Use: No  . Drug Use: No  . Sexual Activity: No   Other Topics Concern  . Not on file   Social History Narrative      Resides at [employer] since 2009. Retired Microbiologist    Widowed with no children.    Living Will, DNR   Exercises predominantly walking about a mile daily   Does not drive   Caffeine beverages occasional   Stopped smoking 1965   1/2 glass of wine almost daily       Allergies  Allergen Reactions  . Plavix [Clopidogrel] Nausea And Vomiting  . Statins Other (See Comments)    Leg cramps     Outpatient Prescriptions Prior to Visit  Medication Sig Dispense Refill  . aspirin 325 MG tablet Take 1 tablet (325 mg total) by mouth daily.    Marland Kitchen esomeprazole (NEXIUM) 40 MG capsule Take 40 mg by mouth daily at 12 noon.    . furosemide (LASIX) 20 MG tablet Take 20 mg by mouth daily.    . isosorbide mononitrate (IMDUR) 60 MG 24 hr tablet Take 60 mg by mouth at bedtime.    Marland Kitchen loteprednol (LOTEMAX) 0.5 % ophthalmic suspension Place 1 drop into the left eye. One drop in left eye 1-2 times daily as needed    . metoprolol succinate (TOPROL-XL) 25 MG 24 hr tablet Take 25 mg by mouth daily.    . Multiple Vitamins-Minerals (ICAPS PO) Take 1 tablet by mouth daily.    . nitroGLYCERIN (NITROSTAT) 0.4 MG SL tablet Place 0.4 mg under the tongue every 5 (five) minutes as needed for chest pain.     . simvastatin (ZOCOR) 20 MG tablet Take 1 tablet (20 mg total) by mouth every other day.    . Fluticasone-Salmeterol (ADVAIR DISKUS) 250-50 MCG/DOSE AEPB Inhale 1 puff into the lungs every 12 (twelve) hours.       No facility-administered medications prior to visit.         Objective:   Physical Exam Filed Vitals:   11/23/14 1502  BP: 138/60  Pulse: 63  Height: 5' (1.524 m)  SpO2: 97%   Gen: Pleasant, elderly kyphotic woman, in no distress,  normal affect, sitting in wheelchair. She does occasionally have cough  ENT: No lesions,  mouth clear,  oropharynx clear, no postnasal drip  Neck: No JVD, no TMG, no carotid bruits  Lungs: No use of accessory muscles, clear without rales or rhonchi  Cardiovascular: RRR, heart sounds normal, no murmur or gallops, no peripheral edema  Musculoskeletal: No deformities, no cyanosis or clubbing  Neuro: alert, non focal  Skin: Warm, no lesions or rashes      Assessment & Plan:  Cough Multifactorial and likely due to upper  airway irritation and intermittent occult aspiration. She seems to have benefited from discontinuation of her Advair. She is also working to strengthen her swallowing mechanism working with physical therapy. I have asked her to continue these things. I will not start any new medications or change her losartan at this time.

## 2014-11-23 NOTE — Assessment & Plan Note (Signed)
Improved with discontinuation of lisinopril and Advair. Seen by pulmonary today with no new orders.

## 2014-11-23 NOTE — Progress Notes (Signed)
Patient ID: Sonya [name], female   DOB: 1913-11-24, 80 y.o.   MRN: [id number]  Nursing Home Location:  [facility]   Code Status: DNR   Place of Service: SNF (31)  Chief Complaint  Patient presents with  . Medical Management of Chronic Issues    HPI:  80 y.o.  Female residing at [facility], skilled care section. I am here to review his/her chronic medical issues.  VS have been stable over the past month. Weight has remained stable at 140.8 lbs. She has a chronic cough that has improved with discontinue the ACE inhibitor and the Advair she was taking. She denies any increased SOB or CP. She was seen by pulmonary, Dr. Lamonte Sakai today, and she was discharged from his care. She reports chronic rhinorrhea and is interested in taking something for this.   Review of Systems:  Review of Systems  Constitutional: Negative for fever, chills, activity change and appetite change.  HENT: Positive for rhinorrhea and trouble swallowing. Negative for congestion and sore throat.   Respiratory: Positive for cough. Negative for shortness of breath and wheezing.   Cardiovascular: Negative for chest pain, palpitations and leg swelling.  Gastrointestinal: Negative for abdominal pain, constipation and abdominal distention.  Genitourinary:       Incontinent  Musculoskeletal: Positive for gait problem.       Uses wc and scooter  Skin: Negative for rash and wound.  Neurological: Positive for facial asymmetry. Negative for dizziness, tremors, seizures, speech difficulty and weakness.  Psychiatric/Behavioral: Negative for behavioral problems and agitation.  02/05/14: MMSE 28/30, no clock in chart for review  Medications: Patient's Medications  New Prescriptions   No medications on file  Previous Medications   ASPIRIN 325 MG TABLET    Take 1 tablet (325 mg total) by mouth daily.   ESOMEPRAZOLE (NEXIUM) 40 MG CAPSULE    Take 40 mg by mouth daily at 12 noon.   FUROSEMIDE (LASIX) 20 MG TABLET    Take 20 mg by mouth daily.   ISOSORBIDE MONONITRATE (IMDUR) 60 MG 24 HR TABLET    Take 60 mg by mouth at bedtime.   LOSARTAN (COZAAR) 50 MG TABLET    Take 50 mg by mouth daily.   LOTEPREDNOL (LOTEMAX) 0.5 % OPHTHALMIC SUSPENSION    Place 1 drop into the left eye. One drop in left eye 1-2 times daily as needed   METOPROLOL SUCCINATE (TOPROL-XL) 25 MG 24 HR TABLET    Take 25 mg by mouth daily.   MOMETASONE (NASONEX) 50 MCG/ACT NASAL SPRAY    Place 2 sprays into the nose daily.   MULTIPLE VITAMINS-MINERALS (ICAPS PO)    Take 1 tablet by mouth daily.   NITROGLYCERIN (NITROSTAT) 0.4 MG SL TABLET    Place 0.4 mg under the tongue every 5 (five) minutes as needed for chest pain.    SIMVASTATIN (ZOCOR) 20 MG TABLET    Take 1 tablet (20 mg total) by mouth every other day.   TRIAMCINOLONE (KENALOG) 0.025 % CREAM    Apply 1 application topically 2 (two) times daily as needed.  Modified Medications   No medications on file  Discontinued Medications   No medications on file     Physical Exam:  Filed Vitals:   11/23/14 1637  BP: 142/73  Pulse: 73  Temp: 98.1 F (36.7 C)  Resp: 20  Weight: 140 lb 12.8 oz (63.866 kg)  SpO2: 95%    Physical Exam  Constitutional: She is oriented  to person, place, and time. No distress.  frail  Neck: No JVD present. No tracheal deviation present.  Cardiovascular: Normal rate.   No murmur heard. Pulmonary/Chest: Effort normal. No respiratory distress. She has no wheezes.  Decreased bs  Abdominal: Soft. Bowel sounds are normal. She exhibits no distension.  Musculoskeletal: She exhibits no edema or tenderness.  Lymphadenopathy:    She has no cervical adenopathy.  Neurological: She is alert and oriented to person, place, and time.  Skin: Skin is warm and dry. She is not diaphoretic. No erythema.  Psychiatric: Affect normal.    Labs reviewed/Significant Diagnostic Results:  02/01/14 2D echo Study Conclusions  - Left  ventricle: The cavity size was normal. There was moderate concentric hypertrophy. Systolic function was normal. The estimated ejection fraction was in the range of 55% to 60%. Wall motion was normal; there were no regional wall motion abnormalities. Doppler parameters are consistent with abnormal left ventricular relaxation (grade 1 diastolic dysfunction). Doppler parameters are consistent with elevated ventricular end-diastolic filling pressure.  Basic Metabolic Panel:  Recent Labs  02/02/14 0540 02/03/14 0600 02/04/14 0830  04/25/14 05/11/14 08/22/14  NA 138 141 144  < > 141 140 142  K 4.2 5.0 4.2  < > 4.1 4.6 4.5  CL 103 105 107  --   --   --   --   CO2 23 25 24   --   --   --   --   GLUCOSE 122* 133* 149*  --   --   --   --   BUN 25* 25* 25*  < > 21 25* 26*  CREATININE 0.77 0.79 0.61  < > 0.9 0.8 0.8  CALCIUM 8.7 8.8 9.0  --   --   --   --   < > = values in this interval not displayed. Liver Function Tests:  Recent Labs  01/31/14 1110 02/16/14 05/11/14  AST 19 17 17   ALT 19 15 23   ALKPHOS 100 84 82  BILITOT 0.5  --   --   PROT 7.6  --   --   ALBUMIN 3.7  --   --    No results for input(s): LIPASE, AMYLASE in the last 8760 hours. No results for input(s): AMMONIA in the last 8760 hours. CBC:  Recent Labs  01/31/14 1110 02/16/14 08/22/14 10/19/14  WBC 10.2 8.6 8.3 8.6  NEUTROABS 7.8*  --   --   --   HGB 14.1 12.8 10.3* 9.8*  HCT 42.4 38 32* 29*  MCV 91.0  --   --   --   PLT 356 334 360 413*   CBG:  Recent Labs  01/31/14 1307  GLUCAP 95   TSH:  Recent Labs  08/22/14 09/16/14  TSH 0.30* 0.40*   A1C: Lab Results  Component Value Date   HGBA1C 6.1* 02/01/2014   Lipid Panel:  Recent Labs  02/01/14 0448 05/23/14 08/22/14  CHOL 235* 96 110  HDL 69 43 48  LDLCALC 129* 37 47  TRIG 185* 78 77  CHOLHDL 3.4  --   --    09/19/14: TSH 0.443, free T4 1.06, free T3 2.8    Assessment/Plan  Dysphagia S/P CVA (cerebrovascular  accident) MBS 02/05/14 showed frank aspiration. NTL with D 1 diet recommended. Resident has signed a waiver with the facility and wishes to intermittently have thin liquids. She continues to work with [place] as well. Continue ST and monitor.  Cough Improved with discontinuation of lisinopril and Advair. Seen  by pulmonary today with no new orders.  Rhinorrhea Nasonex 2 sprays each nare qd  Hyperlipemia Controlled with Zocor. No reported s/e from the statin. Continue and monitor.   Chronic asthma Doing well off Advair  CVA (cerebral vascular accident) No new events. Currently on asa and statin therapy for risk reduction. Continue to monitor.  Chronic diastolic heart failure Stable weight on 20mg  lasix daily. No complaints of CP or SOB at this time. Trace edema. Continue current regimen and monitor.     Cindi Carbon, ANP [facility] [phone]

## 2014-11-23 NOTE — Patient Instructions (Signed)
Stay off Advair for now Continue to work on your swallowing strength through physical therapy Use your thickened diet as much as possible Follow with Dr Lamonte Sakai in 6 months or sooner if you have any problems

## 2014-11-23 NOTE — Assessment & Plan Note (Signed)
Multifactorial and likely due to upper airway irritation and intermittent occult aspiration. She seems to have benefited from discontinuation of her Advair. She is also working to strengthen her swallowing mechanism working with physical therapy. I have asked her to continue these things. I will not start any new medications or change her losartan at this time.

## 2014-11-23 NOTE — Assessment & Plan Note (Signed)
MBS 02/05/14 showed frank aspiration. NTL with D 1 diet recommended. Resident has signed a waiver with the facility and wishes to intermittently have thin liquids. She continues to work with [place] as well. Continue ST and monitor.

## 2014-11-24 ENCOUNTER — Telehealth: Payer: Self-pay | Admitting: Emergency Medicine

## 2014-11-24 NOTE — Telephone Encounter (Signed)
Spoke with Sonya [name] at [facility] [employer] that when the patient returned home yesterday 11/23/14 after visit with [facility], they were reviewing her information and noticed that there was another patient's information (AVS) in her folder. Sonya [name] AVS is nowhere to be found. Advised that this would be faxed over ASAP to [phone]  Apologized for the mix up and inconvenience. Sonya [name] seemed okay with this and just requests that we get her correct AVS over to them to review. Sonya [name] will shred the wrong patient's AVS now.  Nothing further needed.

## 2014-11-27 DIAGNOSIS — J3489 Other specified disorders of nose and nasal sinuses: Secondary | ICD-10-CM | POA: Insufficient documentation

## 2014-11-27 NOTE — Assessment & Plan Note (Signed)
Stable weight on 20mg  lasix daily. No complaints of CP or SOB at this time. Trace edema. Continue current regimen and monitor.

## 2014-11-27 NOTE — Assessment & Plan Note (Signed)
Nasonex 2 sprays each nare qd

## 2014-11-27 NOTE — Assessment & Plan Note (Signed)
Doing well off Advair

## 2014-11-27 NOTE — Assessment & Plan Note (Signed)
No new events. Currently on asa and statin therapy for risk reduction. Continue to monitor.

## 2014-11-27 NOTE — Assessment & Plan Note (Signed)
Controlled with Zocor. No reported s/e from the statin. Continue and monitor.

## 2014-12-18 ENCOUNTER — Encounter: Payer: Self-pay | Admitting: Adult Health

## 2014-12-18 ENCOUNTER — Non-Acute Institutional Stay (SKILLED_NURSING_FACILITY): Payer: Medicare Other | Admitting: Adult Health

## 2014-12-18 DIAGNOSIS — I10 Essential (primary) hypertension: Secondary | ICD-10-CM | POA: Diagnosis not present

## 2014-12-18 DIAGNOSIS — J3489 Other specified disorders of nose and nasal sinuses: Secondary | ICD-10-CM | POA: Diagnosis not present

## 2014-12-18 DIAGNOSIS — R200 Anesthesia of skin: Secondary | ICD-10-CM

## 2014-12-18 NOTE — Assessment & Plan Note (Signed)
BP goal per neuro 130/90 or less. BP ranging 123-151/69-79. Continue current meds and monitor.

## 2014-12-18 NOTE — Progress Notes (Signed)
Patient ID: Sonya [name], female   DOB: 05-23-1913, 79 y.o.   MRN: [id number]    Patient ID: Sonya [name], female   DOB: 24-Aug-1913, 80 y.o.   MRN: [id number]  Nursing Home Location:  [facility]   Code Status: DNR   Place of Service: SNF (31)  Chief Complaint  Patient presents with  . Medical Management of Chronic Issues    HPI:  80 y.o.  Female residing at [facility], skilled care section. I was asked to see her today for right hand numbness. She reports that this has been going on for a couple of weeks. She is concerned because she has a hx of CTS on the left hand. She has a hx of CVA with left sided weakness and uses her right hand to operate her scooter. She reports that the numbness is noted to the wrist and all fingers on the right with no associated pain. She notices it in the morning and it gets better as the day goes on.   Review of Systems:  Review of Systems  Constitutional: Negative for fever, chills, activity change and appetite change.  HENT: Positive for rhinorrhea and trouble swallowing. Negative for congestion and sore throat.   Respiratory: Positive for cough. Negative for shortness of breath and wheezing.   Cardiovascular: Negative for chest pain, palpitations and leg swelling.  Gastrointestinal: Negative for abdominal pain, constipation and abdominal distention.  Genitourinary:       Incontinent  Musculoskeletal: Positive for arthralgias and gait problem.       Uses wc and scooter  Skin: Negative for rash and wound.  Neurological: Positive for facial asymmetry and numbness. Negative for dizziness, tremors, seizures, speech difficulty and weakness.  Psychiatric/Behavioral: Negative for behavioral problems and agitation.  02/05/14: MMSE 28/30, no clock in chart for review  Medications: Patient's Medications  New Prescriptions   No medications on file  Previous Medications   ASPIRIN 325 MG TABLET    Take 1 tablet  (325 mg total) by mouth daily.   ESOMEPRAZOLE (NEXIUM) 40 MG CAPSULE    Take 40 mg by mouth daily at 12 noon.   FUROSEMIDE (LASIX) 20 MG TABLET    Take 20 mg by mouth daily.   ISOSORBIDE MONONITRATE (IMDUR) 60 MG 24 HR TABLET    Take 60 mg by mouth at bedtime.   LOSARTAN (COZAAR) 50 MG TABLET    Take 50 mg by mouth daily.   LOTEPREDNOL (LOTEMAX) 0.5 % OPHTHALMIC SUSPENSION    Place 1 drop into the left eye. One drop in left eye 1-2 times daily as needed   METOPROLOL SUCCINATE (TOPROL-XL) 25 MG 24 HR TABLET    Take 25 mg by mouth daily.   MULTIPLE VITAMINS-MINERALS (ICAPS PO)    Take 1 tablet by mouth daily.   NITROGLYCERIN (NITROSTAT) 0.4 MG SL TABLET    Place 0.4 mg under the tongue every 5 (five) minutes as needed for chest pain.    SIMVASTATIN (ZOCOR) 20 MG TABLET    Take 1 tablet (20 mg total) by mouth every other day.   TRIAMCINOLONE (KENALOG) 0.025 % CREAM    Apply 1 application topically 2 (two) times daily as needed.  Modified Medications   No medications on file  Discontinued Medications   MOMETASONE (NASONEX) 50 MCG/ACT NASAL SPRAY    Place 2 sprays into the nose daily.     Physical Exam:  Filed Vitals:   12/18/14 1416  BP: 151/75  Pulse: 73  Temp: 98 F (36.7 C)  Resp: 18    Physical Exam  Constitutional: She is oriented to person, place, and time. No distress.  frail  Neck: No JVD present. No tracheal deviation present.  Cardiovascular: Normal rate.   No murmur heard. Pulmonary/Chest: Effort normal. No respiratory distress. She has no wheezes.  Decreased bs  Abdominal: Soft. Bowel sounds are normal. She exhibits no distension.  Musculoskeletal: She exhibits no edema or tenderness.  Normal ROM to the right hand with normal sensation. Could not reproduce symptoms. Erythema and some tenderness noted to the right middle finger PIP joint.   Lymphadenopathy:    She has no cervical adenopathy.  Neurological: She is alert and oriented to person, place, and time.  Skin:  Skin is warm and dry. She is not diaphoretic. No erythema.  Psychiatric: Affect normal.    Labs reviewed/Significant Diagnostic Results:  02/01/14 2D echo Study Conclusions  - Left ventricle: The cavity size was normal. There was moderate concentric hypertrophy. Systolic function was normal. The estimated ejection fraction was in the range of 55% to 60%. Wall motion was normal; there were no regional wall motion abnormalities. Doppler parameters are consistent with abnormal left ventricular relaxation (grade 1 diastolic dysfunction). Doppler parameters are consistent with elevated ventricular end-diastolic filling pressure.  Basic Metabolic Panel:  Recent Labs  02/02/14 0540 02/03/14 0600 02/04/14 0830  04/25/14 05/11/14 08/22/14  NA 138 141 144  < > 141 140 142  K 4.2 5.0 4.2  < > 4.1 4.6 4.5  CL 103 105 107  --   --   --   --   CO2 23 25 24   --   --   --   --   GLUCOSE 122* 133* 149*  --   --   --   --   BUN 25* 25* 25*  < > 21 25* 26*  CREATININE 0.77 0.79 0.61  < > 0.9 0.8 0.8  CALCIUM 8.7 8.8 9.0  --   --   --   --   < > = values in this interval not displayed. Liver Function Tests:  Recent Labs  01/31/14 1110 02/16/14 05/11/14  AST 19 17 17   ALT 19 15 23   ALKPHOS 100 84 82  BILITOT 0.5  --   --   PROT 7.6  --   --   ALBUMIN 3.7  --   --    No results for input(s): LIPASE, AMYLASE in the last 8760 hours. No results for input(s): AMMONIA in the last 8760 hours. CBC:  Recent Labs  01/31/14 1110 02/16/14 08/22/14 10/19/14  WBC 10.2 8.6 8.3 8.6  NEUTROABS 7.8*  --   --   --   HGB 14.1 12.8 10.3* 9.8*  HCT 42.4 38 32* 29*  MCV 91.0  --   --   --   PLT 356 334 360 413*   CBG:  Recent Labs  01/31/14 1307  GLUCAP 95   TSH:  Recent Labs  08/22/14 09/16/14  TSH 0.30* 0.40*   A1C: Lab Results  Component Value Date   HGBA1C 6.1* 02/01/2014   Lipid Panel:  Recent Labs  02/01/14 0448 05/23/14 08/22/14  CHOL 235* 96 110  HDL 69  43 48  LDLCALC 129* 37 47  TRIG 185* 78 77  CHOLHDL 3.4  --   --    09/19/14: TSH 0.443, free T4 1.06, free T3 2.8    Assessment/Plan  Finger numbness Noted to right hand, all digits.  Refer to neurology for evaluation.  Previous hx of CTS to left hand with splinting. Begin hand splinting to right hand nightly.  There was some erythema to the right middle PIP joint. Check uric acid.  Hypertension BP goal per neuro 130/90 or less. BP ranging 123-151/69-79. Continue current meds and monitor.  Rhinorrhea Nasonex does not seem to help. Will D/C. She does not wish to try any other therapy.     Cindi Carbon, ANP [facility] [phone]

## 2014-12-18 NOTE — Assessment & Plan Note (Addendum)
Noted to right hand, all digits. Refer to neurology for evaluation.  Previous hx of CTS to left hand with splinting. Begin hand splinting to right hand nightly.  There was some erythema to the right middle PIP joint. Check uric acid.

## 2014-12-18 NOTE — Assessment & Plan Note (Signed)
Nasonex does not seem to help. Will D/C. She does not wish to try any other therapy.

## 2014-12-19 DIAGNOSIS — I10 Essential (primary) hypertension: Secondary | ICD-10-CM | POA: Diagnosis not present

## 2014-12-19 DIAGNOSIS — R4182 Altered mental status, unspecified: Secondary | ICD-10-CM | POA: Diagnosis not present

## 2014-12-25 DIAGNOSIS — R1312 Dysphagia, oropharyngeal phase: Secondary | ICD-10-CM | POA: Diagnosis not present

## 2014-12-25 DIAGNOSIS — G5601 Carpal tunnel syndrome, right upper limb: Secondary | ICD-10-CM | POA: Diagnosis not present

## 2014-12-25 DIAGNOSIS — R208 Other disturbances of skin sensation: Secondary | ICD-10-CM | POA: Diagnosis not present

## 2014-12-25 DIAGNOSIS — M25531 Pain in right wrist: Secondary | ICD-10-CM | POA: Diagnosis not present

## 2014-12-25 DIAGNOSIS — I69391 Dysphagia following cerebral infarction: Secondary | ICD-10-CM | POA: Diagnosis not present

## 2014-12-25 DIAGNOSIS — R278 Other lack of coordination: Secondary | ICD-10-CM | POA: Diagnosis not present

## 2014-12-26 DIAGNOSIS — G5601 Carpal tunnel syndrome, right upper limb: Secondary | ICD-10-CM | POA: Diagnosis not present

## 2014-12-26 DIAGNOSIS — I69391 Dysphagia following cerebral infarction: Secondary | ICD-10-CM | POA: Diagnosis not present

## 2014-12-26 DIAGNOSIS — R208 Other disturbances of skin sensation: Secondary | ICD-10-CM | POA: Diagnosis not present

## 2014-12-26 DIAGNOSIS — M25531 Pain in right wrist: Secondary | ICD-10-CM | POA: Diagnosis not present

## 2014-12-26 DIAGNOSIS — R1312 Dysphagia, oropharyngeal phase: Secondary | ICD-10-CM | POA: Diagnosis not present

## 2014-12-26 DIAGNOSIS — R278 Other lack of coordination: Secondary | ICD-10-CM | POA: Diagnosis not present

## 2014-12-28 DIAGNOSIS — G5601 Carpal tunnel syndrome, right upper limb: Secondary | ICD-10-CM | POA: Diagnosis not present

## 2014-12-28 DIAGNOSIS — R1312 Dysphagia, oropharyngeal phase: Secondary | ICD-10-CM | POA: Diagnosis not present

## 2014-12-28 DIAGNOSIS — R208 Other disturbances of skin sensation: Secondary | ICD-10-CM | POA: Diagnosis not present

## 2014-12-28 DIAGNOSIS — I69391 Dysphagia following cerebral infarction: Secondary | ICD-10-CM | POA: Diagnosis not present

## 2014-12-28 DIAGNOSIS — H3532 Exudative age-related macular degeneration: Secondary | ICD-10-CM | POA: Diagnosis not present

## 2014-12-28 DIAGNOSIS — R278 Other lack of coordination: Secondary | ICD-10-CM | POA: Diagnosis not present

## 2014-12-28 DIAGNOSIS — M25531 Pain in right wrist: Secondary | ICD-10-CM | POA: Diagnosis not present

## 2014-12-29 DIAGNOSIS — M25531 Pain in right wrist: Secondary | ICD-10-CM | POA: Diagnosis not present

## 2014-12-29 DIAGNOSIS — I69391 Dysphagia following cerebral infarction: Secondary | ICD-10-CM | POA: Diagnosis not present

## 2014-12-29 DIAGNOSIS — R1312 Dysphagia, oropharyngeal phase: Secondary | ICD-10-CM | POA: Diagnosis not present

## 2014-12-29 DIAGNOSIS — R278 Other lack of coordination: Secondary | ICD-10-CM | POA: Diagnosis not present

## 2014-12-29 DIAGNOSIS — R208 Other disturbances of skin sensation: Secondary | ICD-10-CM | POA: Diagnosis not present

## 2014-12-29 DIAGNOSIS — G5601 Carpal tunnel syndrome, right upper limb: Secondary | ICD-10-CM | POA: Diagnosis not present

## 2015-01-01 DIAGNOSIS — R208 Other disturbances of skin sensation: Secondary | ICD-10-CM | POA: Diagnosis not present

## 2015-01-01 DIAGNOSIS — I69391 Dysphagia following cerebral infarction: Secondary | ICD-10-CM | POA: Diagnosis not present

## 2015-01-01 DIAGNOSIS — M25531 Pain in right wrist: Secondary | ICD-10-CM | POA: Diagnosis not present

## 2015-01-01 DIAGNOSIS — R1312 Dysphagia, oropharyngeal phase: Secondary | ICD-10-CM | POA: Diagnosis not present

## 2015-01-01 DIAGNOSIS — R278 Other lack of coordination: Secondary | ICD-10-CM | POA: Diagnosis not present

## 2015-01-01 DIAGNOSIS — G5601 Carpal tunnel syndrome, right upper limb: Secondary | ICD-10-CM | POA: Diagnosis not present

## 2015-01-02 DIAGNOSIS — R208 Other disturbances of skin sensation: Secondary | ICD-10-CM | POA: Diagnosis not present

## 2015-01-02 DIAGNOSIS — G5601 Carpal tunnel syndrome, right upper limb: Secondary | ICD-10-CM | POA: Diagnosis not present

## 2015-01-02 DIAGNOSIS — M25531 Pain in right wrist: Secondary | ICD-10-CM | POA: Diagnosis not present

## 2015-01-02 DIAGNOSIS — R278 Other lack of coordination: Secondary | ICD-10-CM | POA: Diagnosis not present

## 2015-01-02 DIAGNOSIS — I69391 Dysphagia following cerebral infarction: Secondary | ICD-10-CM | POA: Diagnosis not present

## 2015-01-02 DIAGNOSIS — R1312 Dysphagia, oropharyngeal phase: Secondary | ICD-10-CM | POA: Diagnosis not present

## 2015-01-03 DIAGNOSIS — I69391 Dysphagia following cerebral infarction: Secondary | ICD-10-CM | POA: Diagnosis not present

## 2015-01-03 DIAGNOSIS — M25531 Pain in right wrist: Secondary | ICD-10-CM | POA: Diagnosis not present

## 2015-01-03 DIAGNOSIS — R208 Other disturbances of skin sensation: Secondary | ICD-10-CM | POA: Diagnosis not present

## 2015-01-03 DIAGNOSIS — R1312 Dysphagia, oropharyngeal phase: Secondary | ICD-10-CM | POA: Diagnosis not present

## 2015-01-03 DIAGNOSIS — R278 Other lack of coordination: Secondary | ICD-10-CM | POA: Diagnosis not present

## 2015-01-03 DIAGNOSIS — G5601 Carpal tunnel syndrome, right upper limb: Secondary | ICD-10-CM | POA: Diagnosis not present

## 2015-01-03 LAB — CBC AND DIFFERENTIAL
HCT: 23 % — AB (ref 36–46)
Hemoglobin: 7.3 g/dL — AB (ref 12.0–16.0)
PLATELETS: 399 10*3/uL (ref 150–399)
WBC: 8.1 10^3/mL

## 2015-01-04 DIAGNOSIS — R208 Other disturbances of skin sensation: Secondary | ICD-10-CM | POA: Diagnosis not present

## 2015-01-04 DIAGNOSIS — G5601 Carpal tunnel syndrome, right upper limb: Secondary | ICD-10-CM | POA: Diagnosis not present

## 2015-01-04 DIAGNOSIS — R278 Other lack of coordination: Secondary | ICD-10-CM | POA: Diagnosis not present

## 2015-01-04 DIAGNOSIS — I69391 Dysphagia following cerebral infarction: Secondary | ICD-10-CM | POA: Diagnosis not present

## 2015-01-04 DIAGNOSIS — M25531 Pain in right wrist: Secondary | ICD-10-CM | POA: Diagnosis not present

## 2015-01-04 DIAGNOSIS — R1312 Dysphagia, oropharyngeal phase: Secondary | ICD-10-CM | POA: Diagnosis not present

## 2015-01-05 DIAGNOSIS — R1312 Dysphagia, oropharyngeal phase: Secondary | ICD-10-CM | POA: Diagnosis not present

## 2015-01-05 DIAGNOSIS — I69391 Dysphagia following cerebral infarction: Secondary | ICD-10-CM | POA: Diagnosis not present

## 2015-01-05 DIAGNOSIS — G5601 Carpal tunnel syndrome, right upper limb: Secondary | ICD-10-CM | POA: Diagnosis not present

## 2015-01-05 DIAGNOSIS — M25531 Pain in right wrist: Secondary | ICD-10-CM | POA: Diagnosis not present

## 2015-01-05 DIAGNOSIS — R208 Other disturbances of skin sensation: Secondary | ICD-10-CM | POA: Diagnosis not present

## 2015-01-05 DIAGNOSIS — R278 Other lack of coordination: Secondary | ICD-10-CM | POA: Diagnosis not present

## 2015-01-08 DIAGNOSIS — G5601 Carpal tunnel syndrome, right upper limb: Secondary | ICD-10-CM | POA: Diagnosis not present

## 2015-01-08 DIAGNOSIS — R278 Other lack of coordination: Secondary | ICD-10-CM | POA: Diagnosis not present

## 2015-01-08 DIAGNOSIS — R1312 Dysphagia, oropharyngeal phase: Secondary | ICD-10-CM | POA: Diagnosis not present

## 2015-01-08 DIAGNOSIS — M25531 Pain in right wrist: Secondary | ICD-10-CM | POA: Diagnosis not present

## 2015-01-08 DIAGNOSIS — R208 Other disturbances of skin sensation: Secondary | ICD-10-CM | POA: Diagnosis not present

## 2015-01-08 DIAGNOSIS — I69391 Dysphagia following cerebral infarction: Secondary | ICD-10-CM | POA: Diagnosis not present

## 2015-01-09 ENCOUNTER — Non-Acute Institutional Stay (SKILLED_NURSING_FACILITY): Payer: Medicare Other | Admitting: Adult Health

## 2015-01-09 ENCOUNTER — Encounter: Payer: Self-pay | Admitting: Adult Health

## 2015-01-09 DIAGNOSIS — M25531 Pain in right wrist: Secondary | ICD-10-CM | POA: Diagnosis not present

## 2015-01-09 DIAGNOSIS — R1312 Dysphagia, oropharyngeal phase: Secondary | ICD-10-CM | POA: Diagnosis not present

## 2015-01-09 DIAGNOSIS — R278 Other lack of coordination: Secondary | ICD-10-CM | POA: Diagnosis not present

## 2015-01-09 DIAGNOSIS — R609 Edema, unspecified: Secondary | ICD-10-CM | POA: Diagnosis not present

## 2015-01-09 DIAGNOSIS — R208 Other disturbances of skin sensation: Secondary | ICD-10-CM | POA: Diagnosis not present

## 2015-01-09 DIAGNOSIS — R110 Nausea: Secondary | ICD-10-CM

## 2015-01-09 DIAGNOSIS — G5601 Carpal tunnel syndrome, right upper limb: Secondary | ICD-10-CM | POA: Diagnosis not present

## 2015-01-09 DIAGNOSIS — I69391 Dysphagia following cerebral infarction: Secondary | ICD-10-CM | POA: Diagnosis not present

## 2015-01-09 NOTE — Progress Notes (Signed)
Patient ID: Sonya [name], female   DOB: 31-Jul-1913, 81 y.o.   MRN: [id number]    Patient ID: Sonya [name], female   DOB: 03/21/1913, 81 y.o.   MRN: [id number]  Nursing Home Location:  [facility]   Code Status: DNR   Place of Service: SNF (31)  Chief Complaint  Patient presents with  . Acute Visit    nausea after pills    HPI:  81 y.o.  Female residing at [facility], skilled care section. I was asked to see her today due to nausea after taking her morning pills. There have been no recent changes to her meds. She reports that the nausea occurs after breakfast when she takes her pills but she does not vomit. She is requesting a review of her meds to discontinue anything that is not necessary but upon further probing does not really want to change her routine.   Review of Systems:  Review of Systems  Constitutional: Negative for fever, chills, activity change and appetite change.  HENT: Positive for rhinorrhea and trouble swallowing. Negative for congestion and sore throat.   Respiratory: Positive for cough. Negative for shortness of breath and wheezing.   Cardiovascular: Negative for chest pain, palpitations and leg swelling.  Gastrointestinal: Negative for abdominal pain, constipation and abdominal distention.  Genitourinary:       Incontinent  Musculoskeletal: Positive for gait problem.       Uses wc and scooter  Skin: Negative for rash and wound.  Neurological: Positive for facial asymmetry. Negative for dizziness, tremors, seizures, speech difficulty and weakness.  Psychiatric/Behavioral: Negative for behavioral problems and agitation.  02/05/14: MMSE 28/30, no clock in chart for review  Medications: Patient's Medications  New Prescriptions   No medications on file  Previous Medications   ASPIRIN 325 MG TABLET    Take 1 tablet (325 mg total) by mouth daily.   ESOMEPRAZOLE (NEXIUM) 40 MG CAPSULE    Take 40 mg by mouth daily at  12 noon.   FUROSEMIDE (LASIX) 20 MG TABLET    Take 20 mg by mouth every other day.    ISOSORBIDE MONONITRATE (IMDUR) 60 MG 24 HR TABLET    Take 60 mg by mouth at bedtime.   LOSARTAN (COZAAR) 50 MG TABLET    Take 50 mg by mouth daily.   LOTEPREDNOL (LOTEMAX) 0.5 % OPHTHALMIC SUSPENSION    Place 1 drop into the left eye. One drop in left eye 1-2 times daily as needed   METOPROLOL SUCCINATE (TOPROL-XL) 25 MG 24 HR TABLET    Take 25 mg by mouth daily.   MULTIPLE VITAMINS-MINERALS (ICAPS PO)    Take 1 tablet by mouth daily.   NITROGLYCERIN (NITROSTAT) 0.4 MG SL TABLET    Place 0.4 mg under the tongue every 5 (five) minutes as needed for chest pain.    SIMVASTATIN (ZOCOR) 20 MG TABLET    Take 1 tablet (20 mg total) by mouth every other day.   TRIAMCINOLONE (KENALOG) 0.025 % CREAM    Apply 1 application topically 2 (two) times daily as needed.  Modified Medications   No medications on file  Discontinued Medications   No medications on file     Physical Exam:  Filed Vitals:   01/09/15 0954  BP: 119/67  Pulse: 62  Temp: 97.2 F (36.2 C)  Resp: 16  SpO2: 95%    Physical Exam  Constitutional: She is oriented to person, place, and time. No distress.  frail  Neck:  No JVD present. No tracheal deviation present.  Cardiovascular: Normal rate.   No murmur heard. Pulmonary/Chest: Effort normal. No respiratory distress. She has no wheezes.  Decreased bs  Abdominal: Soft. Bowel sounds are normal. She exhibits no distension.  Musculoskeletal: She exhibits no edema or tenderness.  Lymphadenopathy:    She has no cervical adenopathy.  Neurological: She is alert and oriented to person, place, and time.  Skin: Skin is warm and dry. She is not diaphoretic. No erythema.  Psychiatric: Affect normal.    Labs reviewed/Significant Diagnostic Results:  02/01/14 2D echo Study Conclusions  - Left ventricle: The cavity size was normal. There was moderate concentric hypertrophy. Systolic function  was normal. The estimated ejection fraction was in the range of 55% to 60%. Wall motion was normal; there were no regional wall motion abnormalities. Doppler parameters are consistent with abnormal left ventricular relaxation (grade 1 diastolic dysfunction). Doppler parameters are consistent with elevated ventricular end-diastolic filling pressure.  Basic Metabolic Panel:  Recent Labs  02/02/14 0540 02/03/14 0600 02/04/14 0830  04/25/14 05/11/14 08/22/14  NA 138 141 144  < > 141 140 142  K 4.2 5.0 4.2  < > 4.1 4.6 4.5  CL 103 105 107  --   --   --   --   CO2 23 25 24   --   --   --   --   GLUCOSE 122* 133* 149*  --   --   --   --   BUN 25* 25* 25*  < > 21 25* 26*  CREATININE 0.77 0.79 0.61  < > 0.9 0.8 0.8  CALCIUM 8.7 8.8 9.0  --   --   --   --   < > = values in this interval not displayed. Liver Function Tests:  Recent Labs  01/31/14 1110 02/16/14 05/11/14  AST 19 17 17   ALT 19 15 23   ALKPHOS 100 84 82  BILITOT 0.5  --   --   PROT 7.6  --   --   ALBUMIN 3.7  --   --    No results for input(s): LIPASE, AMYLASE in the last 8760 hours. No results for input(s): AMMONIA in the last 8760 hours. CBC:  Recent Labs  01/31/14 1110 02/16/14 08/22/14 10/19/14  WBC 10.2 8.6 8.3 8.6  NEUTROABS 7.8*  --   --   --   HGB 14.1 12.8 10.3* 9.8*  HCT 42.4 38 32* 29*  MCV 91.0  --   --   --   PLT 356 334 360 413*   CBG:  Recent Labs  01/31/14 1307  GLUCAP 95   TSH:  Recent Labs  08/22/14 09/16/14  TSH 0.30* 0.40*   A1C: Lab Results  Component Value Date   HGBA1C 6.1* 02/01/2014   Lipid Panel:  Recent Labs  02/01/14 0448 05/23/14 08/22/14  CHOL 235* 96 110  HDL 69 43 48  LDLCALC 129* 37 47  TRIG 185* 78 77  CHOLHDL 3.4  --   --    09/19/14: TSH 0.443, free T4 1.06, free T3 2.8    Assessment/Plan  Nausea Occurs after morning pills. Discussed changing her Nexium to am dosing and she declined. Discussed discontinuing the vitamin and she  declined. Would nt engage in conversation with me about this issue. I did not see any other meds that could be causing the issue and it does not occur everyday. I asked her to report if this continues. No changes made.  Edema No edema noted today with BP on the low side. She would like to decrease her pill burden and decrease her lasix dose. Will try QOD and monitor weight and BP.     Cindi Carbon, ANP [facility] [phone]

## 2015-01-09 NOTE — Assessment & Plan Note (Addendum)
No edema noted today with BP on the low side. She would like to decrease her pill burden and decrease her lasix dose. Will try QOD and monitor weight and BP.

## 2015-01-09 NOTE — Assessment & Plan Note (Signed)
Occurs after morning pills. Discussed changing her Nexium to am dosing and she declined. Discussed discontinuing the vitamin and she declined. Would nt engage in conversation with me about this issue. I did not see any other meds that could be causing the issue and it does not occur everyday. I asked her to report if this continues. No changes made.

## 2015-01-11 DIAGNOSIS — R1312 Dysphagia, oropharyngeal phase: Secondary | ICD-10-CM | POA: Diagnosis not present

## 2015-01-11 DIAGNOSIS — M25531 Pain in right wrist: Secondary | ICD-10-CM | POA: Diagnosis not present

## 2015-01-11 DIAGNOSIS — R208 Other disturbances of skin sensation: Secondary | ICD-10-CM | POA: Diagnosis not present

## 2015-01-11 DIAGNOSIS — G5601 Carpal tunnel syndrome, right upper limb: Secondary | ICD-10-CM | POA: Diagnosis not present

## 2015-01-11 DIAGNOSIS — R278 Other lack of coordination: Secondary | ICD-10-CM | POA: Diagnosis not present

## 2015-01-11 DIAGNOSIS — I69391 Dysphagia following cerebral infarction: Secondary | ICD-10-CM | POA: Diagnosis not present

## 2015-01-15 DIAGNOSIS — R1312 Dysphagia, oropharyngeal phase: Secondary | ICD-10-CM | POA: Diagnosis not present

## 2015-01-15 DIAGNOSIS — I69391 Dysphagia following cerebral infarction: Secondary | ICD-10-CM | POA: Diagnosis not present

## 2015-01-15 DIAGNOSIS — R208 Other disturbances of skin sensation: Secondary | ICD-10-CM | POA: Diagnosis not present

## 2015-01-15 DIAGNOSIS — G5601 Carpal tunnel syndrome, right upper limb: Secondary | ICD-10-CM | POA: Diagnosis not present

## 2015-01-15 DIAGNOSIS — R278 Other lack of coordination: Secondary | ICD-10-CM | POA: Diagnosis not present

## 2015-01-15 DIAGNOSIS — M25531 Pain in right wrist: Secondary | ICD-10-CM | POA: Diagnosis not present

## 2015-01-16 DIAGNOSIS — G5601 Carpal tunnel syndrome, right upper limb: Secondary | ICD-10-CM | POA: Diagnosis not present

## 2015-01-16 DIAGNOSIS — R278 Other lack of coordination: Secondary | ICD-10-CM | POA: Diagnosis not present

## 2015-01-16 DIAGNOSIS — I69391 Dysphagia following cerebral infarction: Secondary | ICD-10-CM | POA: Diagnosis not present

## 2015-01-16 DIAGNOSIS — R208 Other disturbances of skin sensation: Secondary | ICD-10-CM | POA: Diagnosis not present

## 2015-01-16 DIAGNOSIS — R1312 Dysphagia, oropharyngeal phase: Secondary | ICD-10-CM | POA: Diagnosis not present

## 2015-01-16 DIAGNOSIS — M25531 Pain in right wrist: Secondary | ICD-10-CM | POA: Diagnosis not present

## 2015-01-18 DIAGNOSIS — R278 Other lack of coordination: Secondary | ICD-10-CM | POA: Diagnosis not present

## 2015-01-18 DIAGNOSIS — R208 Other disturbances of skin sensation: Secondary | ICD-10-CM | POA: Diagnosis not present

## 2015-01-18 DIAGNOSIS — I69391 Dysphagia following cerebral infarction: Secondary | ICD-10-CM | POA: Diagnosis not present

## 2015-01-18 DIAGNOSIS — G5601 Carpal tunnel syndrome, right upper limb: Secondary | ICD-10-CM | POA: Diagnosis not present

## 2015-01-18 DIAGNOSIS — R1312 Dysphagia, oropharyngeal phase: Secondary | ICD-10-CM | POA: Diagnosis not present

## 2015-01-18 DIAGNOSIS — M25531 Pain in right wrist: Secondary | ICD-10-CM | POA: Diagnosis not present

## 2015-01-19 DIAGNOSIS — G5601 Carpal tunnel syndrome, right upper limb: Secondary | ICD-10-CM | POA: Diagnosis not present

## 2015-01-19 DIAGNOSIS — R208 Other disturbances of skin sensation: Secondary | ICD-10-CM | POA: Diagnosis not present

## 2015-01-19 DIAGNOSIS — R1312 Dysphagia, oropharyngeal phase: Secondary | ICD-10-CM | POA: Diagnosis not present

## 2015-01-19 DIAGNOSIS — I69391 Dysphagia following cerebral infarction: Secondary | ICD-10-CM | POA: Diagnosis not present

## 2015-01-19 DIAGNOSIS — R278 Other lack of coordination: Secondary | ICD-10-CM | POA: Diagnosis not present

## 2015-01-19 DIAGNOSIS — M25531 Pain in right wrist: Secondary | ICD-10-CM | POA: Diagnosis not present

## 2015-01-22 DIAGNOSIS — M25531 Pain in right wrist: Secondary | ICD-10-CM | POA: Diagnosis not present

## 2015-01-22 DIAGNOSIS — R208 Other disturbances of skin sensation: Secondary | ICD-10-CM | POA: Diagnosis not present

## 2015-01-22 DIAGNOSIS — R278 Other lack of coordination: Secondary | ICD-10-CM | POA: Diagnosis not present

## 2015-01-22 DIAGNOSIS — G5601 Carpal tunnel syndrome, right upper limb: Secondary | ICD-10-CM | POA: Diagnosis not present

## 2015-01-23 DIAGNOSIS — G5601 Carpal tunnel syndrome, right upper limb: Secondary | ICD-10-CM | POA: Diagnosis not present

## 2015-01-23 DIAGNOSIS — R278 Other lack of coordination: Secondary | ICD-10-CM | POA: Diagnosis not present

## 2015-01-23 DIAGNOSIS — R208 Other disturbances of skin sensation: Secondary | ICD-10-CM | POA: Diagnosis not present

## 2015-01-23 DIAGNOSIS — M25531 Pain in right wrist: Secondary | ICD-10-CM | POA: Diagnosis not present

## 2015-01-25 DIAGNOSIS — M25531 Pain in right wrist: Secondary | ICD-10-CM | POA: Diagnosis not present

## 2015-01-25 DIAGNOSIS — R208 Other disturbances of skin sensation: Secondary | ICD-10-CM | POA: Diagnosis not present

## 2015-01-25 DIAGNOSIS — R278 Other lack of coordination: Secondary | ICD-10-CM | POA: Diagnosis not present

## 2015-01-25 DIAGNOSIS — G5601 Carpal tunnel syndrome, right upper limb: Secondary | ICD-10-CM | POA: Diagnosis not present

## 2015-01-30 DIAGNOSIS — M25531 Pain in right wrist: Secondary | ICD-10-CM | POA: Diagnosis not present

## 2015-01-30 DIAGNOSIS — G5601 Carpal tunnel syndrome, right upper limb: Secondary | ICD-10-CM | POA: Diagnosis not present

## 2015-01-30 DIAGNOSIS — R278 Other lack of coordination: Secondary | ICD-10-CM | POA: Diagnosis not present

## 2015-01-30 DIAGNOSIS — R208 Other disturbances of skin sensation: Secondary | ICD-10-CM | POA: Diagnosis not present

## 2015-01-31 DIAGNOSIS — R208 Other disturbances of skin sensation: Secondary | ICD-10-CM | POA: Diagnosis not present

## 2015-01-31 DIAGNOSIS — M25531 Pain in right wrist: Secondary | ICD-10-CM | POA: Diagnosis not present

## 2015-01-31 DIAGNOSIS — G5601 Carpal tunnel syndrome, right upper limb: Secondary | ICD-10-CM | POA: Diagnosis not present

## 2015-01-31 DIAGNOSIS — R278 Other lack of coordination: Secondary | ICD-10-CM | POA: Diagnosis not present

## 2015-02-06 DIAGNOSIS — G5601 Carpal tunnel syndrome, right upper limb: Secondary | ICD-10-CM | POA: Diagnosis not present

## 2015-02-06 DIAGNOSIS — M25531 Pain in right wrist: Secondary | ICD-10-CM | POA: Diagnosis not present

## 2015-02-06 DIAGNOSIS — R278 Other lack of coordination: Secondary | ICD-10-CM | POA: Diagnosis not present

## 2015-02-06 DIAGNOSIS — R208 Other disturbances of skin sensation: Secondary | ICD-10-CM | POA: Diagnosis not present

## 2015-02-08 DIAGNOSIS — H18422 Band keratopathy, left eye: Secondary | ICD-10-CM | POA: Diagnosis not present

## 2015-02-08 DIAGNOSIS — H3532 Exudative age-related macular degeneration: Secondary | ICD-10-CM | POA: Diagnosis not present

## 2015-02-08 DIAGNOSIS — G5601 Carpal tunnel syndrome, right upper limb: Secondary | ICD-10-CM | POA: Diagnosis not present

## 2015-02-08 DIAGNOSIS — M25531 Pain in right wrist: Secondary | ICD-10-CM | POA: Diagnosis not present

## 2015-02-08 DIAGNOSIS — Z961 Presence of intraocular lens: Secondary | ICD-10-CM | POA: Diagnosis not present

## 2015-02-08 DIAGNOSIS — R208 Other disturbances of skin sensation: Secondary | ICD-10-CM | POA: Diagnosis not present

## 2015-02-08 DIAGNOSIS — R278 Other lack of coordination: Secondary | ICD-10-CM | POA: Diagnosis not present

## 2015-02-09 DIAGNOSIS — M25531 Pain in right wrist: Secondary | ICD-10-CM | POA: Diagnosis not present

## 2015-02-09 DIAGNOSIS — R278 Other lack of coordination: Secondary | ICD-10-CM | POA: Diagnosis not present

## 2015-02-09 DIAGNOSIS — R208 Other disturbances of skin sensation: Secondary | ICD-10-CM | POA: Diagnosis not present

## 2015-02-09 DIAGNOSIS — G5601 Carpal tunnel syndrome, right upper limb: Secondary | ICD-10-CM | POA: Diagnosis not present

## 2015-02-22 ENCOUNTER — Encounter: Payer: Self-pay | Admitting: Adult Health

## 2015-02-22 ENCOUNTER — Non-Acute Institutional Stay (SKILLED_NURSING_FACILITY): Payer: Medicare Other | Admitting: Adult Health

## 2015-02-22 DIAGNOSIS — I2583 Coronary atherosclerosis due to lipid rich plaque: Secondary | ICD-10-CM

## 2015-02-22 DIAGNOSIS — I69391 Dysphagia following cerebral infarction: Secondary | ICD-10-CM

## 2015-02-22 DIAGNOSIS — I639 Cerebral infarction, unspecified: Secondary | ICD-10-CM | POA: Diagnosis not present

## 2015-02-22 DIAGNOSIS — G2581 Restless legs syndrome: Secondary | ICD-10-CM

## 2015-02-22 DIAGNOSIS — D649 Anemia, unspecified: Secondary | ICD-10-CM | POA: Diagnosis not present

## 2015-02-22 DIAGNOSIS — I5032 Chronic diastolic (congestive) heart failure: Secondary | ICD-10-CM | POA: Diagnosis not present

## 2015-02-22 DIAGNOSIS — I10 Essential (primary) hypertension: Secondary | ICD-10-CM | POA: Diagnosis not present

## 2015-02-22 DIAGNOSIS — I2510 Atherosclerotic heart disease of native coronary artery without angina pectoris: Secondary | ICD-10-CM | POA: Diagnosis not present

## 2015-02-22 NOTE — Progress Notes (Signed)
Patient ID: Sonya [name], female   DOB: 13-Jun-1913, 81 y.o.   MRN: [id number]     Nursing Home Location:  [facility]   Code Status: DNR   Place of Service: SNF (31)  Chief Complaint  Patient presents with  . Medical Management of Chronic Issues    HPI:  81 y.o.  Female residing at [facility], skilled care section. I am here to review her chronic medical issues.  She has a hx of CVA with left sided weakness, CAD with stents, HTN, asthma, CHF, dysphagia, anemia, and constipation. VS have been stable over the past month. Weight has remained stable at 143.7 lbs. She reports restless legs at night that keep her up at night.  She denies SOB, CP, DOE, or PND today She uses the scooter to move about the facility and expresses that she is weaker than she used to be and wants to exercise more.  She also reports that her memory has been getting worse over time. However, this was not notable during our conversation.  02/05/14: MMSE 28/30, failed clock but has poor vision and difficulty writing  Review of Systems:  Review of Systems  Constitutional: Negative for fever, chills, activity change and appetite change.  HENT: Positive for rhinorrhea and trouble swallowing. Negative for congestion and sore throat.   Respiratory: Negative for cough, chest tightness, shortness of breath and wheezing.   Cardiovascular: Positive for leg swelling. Negative for chest pain and palpitations.  Gastrointestinal: Negative for abdominal pain, constipation and abdominal distention.  Genitourinary:       Incontinent  Musculoskeletal: Positive for gait problem.       Uses wc and scooter  Skin: Negative for rash and wound.  Neurological: Positive for facial asymmetry. Negative for dizziness, tremors, seizures, speech difficulty and weakness.  Psychiatric/Behavioral: Negative for behavioral problems and agitation.   Medications: Patient's Medications  New Prescriptions     No medications on file  Previous Medications   ASPIRIN 325 MG TABLET    Take 1 tablet (325 mg total) by mouth daily.   ESOMEPRAZOLE (NEXIUM) 40 MG CAPSULE    Take 40 mg by mouth daily at 12 noon.   FUROSEMIDE (LASIX) 20 MG TABLET    Take 20 mg by mouth every other day.    ISOSORBIDE MONONITRATE (IMDUR) 60 MG 24 HR TABLET    Take 60 mg by mouth at bedtime.   LOSARTAN (COZAAR) 50 MG TABLET    Take 50 mg by mouth daily.   LOTEPREDNOL (LOTEMAX) 0.5 % OPHTHALMIC SUSPENSION    Place 1 drop into the left eye. One drop in left eye 1-2 times daily as needed   METOPROLOL SUCCINATE (TOPROL-XL) 25 MG 24 HR TABLET    Take 25 mg by mouth daily.   MULTIPLE VITAMINS-MINERALS (ICAPS PO)    Take 1 tablet by mouth daily.   NITROGLYCERIN (NITROSTAT) 0.4 MG SL TABLET    Place 0.4 mg under the tongue every 5 (five) minutes as needed for chest pain.    SIMVASTATIN (ZOCOR) 20 MG TABLET    Take 1 tablet (20 mg total) by mouth every other day.   TRIAMCINOLONE (KENALOG) 0.025 % CREAM    Apply 1 application topically 2 (two) times daily as needed.  Modified Medications   No medications on file  Discontinued Medications   No medications on file     Physical Exam:  Filed Vitals:   02/22/15 1148  BP: 112/75  Pulse: 69  Temp: 97.5  F (36.4 C)  Resp: 20  Weight: 143 lb 11.2 oz (65.182 kg)  SpO2: 98%    Physical Exam  Constitutional: She is oriented to person, place, and time. No distress.  frail  Neck: No JVD present. No tracheal deviation present.  Cardiovascular: Normal rate.   No murmur heard. Pulmonary/Chest: Effort normal. No respiratory distress. She has no wheezes.  Decreased bs  Abdominal: Soft. Bowel sounds are normal. She exhibits no distension.  Musculoskeletal: She exhibits no edema or tenderness.  Lymphadenopathy:    She has no cervical adenopathy.  Neurological: She is alert and oriented to person, place, and time.  Skin: Skin is warm and dry. She is not diaphoretic. No erythema.   Psychiatric: Affect normal.    Labs reviewed/Significant Diagnostic Results:  02/01/14 2D echo Study Conclusions  - Left ventricle: The cavity size was normal. There was moderate concentric hypertrophy. Systolic function was normal. The estimated ejection fraction was in the range of 55% to 60%. Wall motion was normal; there were no regional wall motion abnormalities. Doppler parameters are consistent with abnormal left ventricular relaxation (grade 1 diastolic dysfunction). Doppler parameters are consistent with elevated ventricular end-diastolic filling pressure.  Basic Metabolic Panel:  Recent Labs  04/25/14 05/11/14 08/22/14  NA 141 140 142  K 4.1 4.6 4.5  BUN 21 25* 26*  CREATININE 0.9 0.8 0.8   Liver Function Tests:  Recent Labs  05/11/14  AST 17  ALT 23  ALKPHOS 82   No results for input(s): LIPASE, AMYLASE in the last 8760 hours. No results for input(s): AMMONIA in the last 8760 hours. CBC:  Recent Labs  08/22/14 10/19/14  WBC 8.3 8.6  HGB 10.3* 9.8*  HCT 32* 29*  PLT 360 413*   CBG: No results for input(s): GLUCAP in the last 8760 hours. TSH:  Recent Labs  08/22/14 09/16/14  TSH 0.30* 0.40*   A1C: Lab Results  Component Value Date   HGBA1C 6.1* 02/01/2014   Lipid Panel:  Recent Labs  05/23/14 08/22/14  CHOL 96 110  HDL 43 48  LDLCALC 37 47  TRIG 78 77   09/19/14: TSH 0.443, free T4 1.06, free T3 2.8    Assessment/Plan  1. Restless leg Check BMP and Mg  2. Essential hypertension Controlled. Check BMP  3. CVA (cerebral vascular accident) No new events. Intolerant to Plavix. Continue ASA and check lipid panel, currently on Zocor but only using QOD due to h/o myalgias  4. Chronic diastolic heart failure Weight stable on lasix QOD with minimal edema. Continue current meds. F/u with cardiology due in April  5. Dysphagia S/P CVA (cerebrovascular accident) She has worked with speech and progressed to regular diet  with thin liquids.   6. Coronary artery disease due to lipid rich plaque Stable, no new issues. Continue statin and ASA  7.Anemia Most likely ACD, check CBC  Labs TSH, Mg, lipids, CMP, CBC Check MMSE  Cindi Carbon, ANP [facility] [phone]

## 2015-02-23 ENCOUNTER — Emergency Department (HOSPITAL_COMMUNITY)
Admission: EM | Admit: 2015-02-23 | Discharge: 2015-02-24 | Disposition: A | Discharge status: Home / Self Care | Payer: Medicare Other | Attending: Emergency Medicine | Admitting: Emergency Medicine

## 2015-02-23 ENCOUNTER — Encounter (HOSPITAL_COMMUNITY): Payer: Self-pay | Admitting: *Deleted

## 2015-02-23 DIAGNOSIS — Z87891 Personal history of nicotine dependence: Secondary | ICD-10-CM | POA: Diagnosis not present

## 2015-02-23 DIAGNOSIS — Z7952 Long term (current) use of systemic steroids: Secondary | ICD-10-CM | POA: Diagnosis not present

## 2015-02-23 DIAGNOSIS — J45909 Unspecified asthma, uncomplicated: Secondary | ICD-10-CM | POA: Diagnosis not present

## 2015-02-23 DIAGNOSIS — I509 Heart failure, unspecified: Secondary | ICD-10-CM | POA: Diagnosis not present

## 2015-02-23 DIAGNOSIS — I5032 Chronic diastolic (congestive) heart failure: Secondary | ICD-10-CM | POA: Diagnosis not present

## 2015-02-23 DIAGNOSIS — F329 Major depressive disorder, single episode, unspecified: Secondary | ICD-10-CM | POA: Insufficient documentation

## 2015-02-23 DIAGNOSIS — E785 Hyperlipidemia, unspecified: Secondary | ICD-10-CM | POA: Diagnosis not present

## 2015-02-23 DIAGNOSIS — Z7982 Long term (current) use of aspirin: Secondary | ICD-10-CM | POA: Diagnosis not present

## 2015-02-23 DIAGNOSIS — Z87828 Personal history of other (healed) physical injury and trauma: Secondary | ICD-10-CM | POA: Insufficient documentation

## 2015-02-23 DIAGNOSIS — Z8619 Personal history of other infectious and parasitic diseases: Secondary | ICD-10-CM | POA: Diagnosis not present

## 2015-02-23 DIAGNOSIS — Z79899 Other long term (current) drug therapy: Secondary | ICD-10-CM | POA: Insufficient documentation

## 2015-02-23 DIAGNOSIS — I10 Essential (primary) hypertension: Secondary | ICD-10-CM | POA: Diagnosis not present

## 2015-02-23 DIAGNOSIS — I2510 Atherosclerotic heart disease of native coronary artery without angina pectoris: Secondary | ICD-10-CM | POA: Insufficient documentation

## 2015-02-23 DIAGNOSIS — Z8673 Personal history of transient ischemic attack (TIA), and cerebral infarction without residual deficits: Secondary | ICD-10-CM | POA: Insufficient documentation

## 2015-02-23 DIAGNOSIS — I6350 Cerebral infarction due to unspecified occlusion or stenosis of unspecified cerebral artery: Secondary | ICD-10-CM | POA: Diagnosis not present

## 2015-02-23 DIAGNOSIS — D500 Iron deficiency anemia secondary to blood loss (chronic): Secondary | ICD-10-CM | POA: Insufficient documentation

## 2015-02-23 DIAGNOSIS — E744 Disorders of pyruvate metabolism and gluconeogenesis: Secondary | ICD-10-CM | POA: Diagnosis not present

## 2015-02-23 DIAGNOSIS — Z8669 Personal history of other diseases of the nervous system and sense organs: Secondary | ICD-10-CM | POA: Diagnosis not present

## 2015-02-23 DIAGNOSIS — R7989 Other specified abnormal findings of blood chemistry: Secondary | ICD-10-CM | POA: Diagnosis present

## 2015-02-23 DIAGNOSIS — K921 Melena: Secondary | ICD-10-CM | POA: Diagnosis not present

## 2015-02-23 LAB — CBC
HCT: 46.8 % — ABNORMAL HIGH (ref 36.0–46.0)
HCT: 23.2 % — ABNORMAL LOW (ref 36.0–46.0)
HEMOGLOBIN: 14.7 g/dL (ref 12.0–15.0)
Hemoglobin: 7.4 g/dL — ABNORMAL LOW (ref 12.0–15.0)
MCH: 27.7 pg (ref 26.0–34.0)
MCH: 27.9 pg (ref 26.0–34.0)
MCHC: 31.4 g/dL (ref 30.0–36.0)
MCHC: 31.9 g/dL (ref 30.0–36.0)
MCV: 88.3 fL (ref 78.0–100.0)
MCV: 87.5 fL (ref 78.0–100.0)
Platelets: 232 10*3/uL (ref 150–400)
Platelets: 439 10*3/uL — ABNORMAL HIGH (ref 150–400)
RBC: 5.3 MIL/uL — AB (ref 3.87–5.11)
RBC: 2.65 MIL/uL — ABNORMAL LOW (ref 3.87–5.11)
RDW: 13.3 % (ref 11.5–15.5)
RDW: 13.2 % (ref 11.5–15.5)
WBC: 4.5 10*3/uL (ref 4.0–10.5)
WBC: 9 10*3/uL (ref 4.0–10.5)

## 2015-02-23 LAB — CBC AND DIFFERENTIAL
HCT: 20 % — AB (ref 36–46)
Hemoglobin: 6.4 g/dL — AB (ref 12.0–16.0)
Platelets: 393 10*3/uL (ref 150–399)
WBC: 9.4 10^3/mL

## 2015-02-23 LAB — LIPID PANEL
Cholesterol: 145 mg/dL (ref 0–200)
HDL: 45 mg/dL (ref 35–70)
LDL Cholesterol: 82 mg/dL
LDl/HDL Ratio: 1.8
Triglycerides: 141 mg/dL (ref 40–160)

## 2015-02-23 LAB — COMPREHENSIVE METABOLIC PANEL
ALK PHOS: 85 U/L (ref 39–117)
ALT: 14 U/L (ref 0–35)
AST: 25 U/L (ref 0–37)
Albumin: 3.7 g/dL (ref 3.5–5.2)
Anion gap: 8 (ref 5–15)
BILIRUBIN TOTAL: 0.4 mg/dL (ref 0.3–1.2)
BUN: 23 mg/dL (ref 6–23)
CALCIUM: 8.6 mg/dL (ref 8.4–10.5)
CO2: 25 mmol/L (ref 19–32)
Chloride: 106 mmol/L (ref 96–112)
Creatinine, Ser: 0.8 mg/dL (ref 0.50–1.10)
GFR calc Af Amer: 68 mL/min — ABNORMAL LOW (ref >90)
GFR calc non Af Amer: 59 mL/min — ABNORMAL LOW (ref >90)
Glucose, Bld: 85 mg/dL (ref 70–99)
Potassium: 5 mmol/L (ref 3.5–5.1)
SODIUM: 139 mmol/L (ref 135–145)
Total Protein: 6.9 g/dL (ref 6.0–8.3)

## 2015-02-23 LAB — BASIC METABOLIC PANEL
BUN: 19 mg/dL (ref 4–21)
Creatinine: 0.7 mg/dL (ref 0.5–1.1)
Glucose: 105 mg/dL
Potassium: 4.8 mmol/L (ref 3.4–5.3)
Sodium: 139 mmol/L (ref 137–147)

## 2015-02-23 LAB — SAMPLE TO BLOOD BANK

## 2015-02-23 LAB — HEPATIC FUNCTION PANEL
ALT: 10 U/L (ref 7–35)
AST: 15 U/L (ref 13–35)
Alkaline Phosphatase: 81 U/L (ref 25–125)

## 2015-02-23 LAB — ABO/RH: ABO/RH(D): O POS

## 2015-02-23 LAB — TSH: TSH: 0.67 u[IU]/mL (ref 0.41–5.90)

## 2015-02-23 LAB — HEMOGLOBIN AND HEMATOCRIT, BLOOD
HEMATOCRIT: 23.1 % — AB (ref 36.0–46.0)
Hemoglobin: 7.3 g/dL — ABNORMAL LOW (ref 12.0–15.0)

## 2015-02-23 LAB — PREPARE RBC (CROSSMATCH)

## 2015-02-23 MED ORDER — LORAZEPAM 2 MG/ML IJ SOLN
1.0000 mg | Freq: Once | INTRAMUSCULAR | Status: AC
  Administered 2015-02-23: 1 mg via INTRAVENOUS
  Filled 2015-02-23: qty 1

## 2015-02-23 NOTE — ED Provider Notes (Signed)
  Face-to-face evaluation   History: Patient is here for evaluation of low hemoglobin found on routine labs today. She denies any symptoms including headache, weakness, dizziness, chest pain, nausea or vomiting.  Physical exam: Alert white female who is conversant and calm. Vital signs reviewed and are normal except for mild systolic hypertension. She is alert and oriented 3. She is mildly dysarthric from her prior stroke.  Medical screening examination/treatment/procedure(s) were conducted as a shared visit with non-physician practitioner(s) and myself.  I personally evaluated the patient during the encounter  Richarda Blade, MD 02/24/15 [phone]

## 2015-02-23 NOTE — ED Provider Notes (Signed)
CSN: [id number]     Arrival date & time 02/23/15  1338 History   First MD Initiated Contact with Patient 02/23/15 1510     Chief Complaint  Patient presents with  . Abnormal Lab    low HGB      (Consider location/radiation/quality/duration/timing/severity/associated sxs/prior Treatment) HPI Comments: 81 year old female sent to ED for evaluation of reported low hemoglobin (noted on facility labs yesterday) and concern for dark colored stools.  Patient reports short-lived episode of shortness of breath several nights ago that resolved spontaneously.  She denies any chest pain, abdominal pain, or change in bowel habits.  Prior stroke that limits her ability to ambulate.  Patient is a 81 y.o. female presenting with GI illness. The history is provided by the patient and medical records.  GI Problem This is a new problem. The current episode started yesterday. Pertinent negatives include no chest pain or coughing.    Past Medical History  Diagnosis Date  . Acute bronchitis   . Asthma   . TIA (transient ischemic attack)   . Herpes zoster complication 7829    complicated, left eye involvement  . Nontoxic uninodular goiter     negative [place] 2008  . Macular degeneration (senile) of retina, unspecified 2012    wet  . Acute myocardial infarction, unspecified site, episode of care unspecified 03/07/11  . Coronary atherosclerosis of native coronary artery      s/p stent diagonal branch 1996; RCA 03/07/11  . Chronic diastolic heart failure   . Cardiomegaly   . Diaphragmatic hernia without mention of obstruction or gangrene   . Irritable bowel syndrome   . Other specified genital prolapse(618.89)     corrected with pessary  . Edema 2006  . Other abnormal blood chemistry   . Debility, unspecified   . Open wound of knee, leg (except thigh), and ankle, without mention of complication 56/2130    left leg, complicated wound, extended healing, resolved 01/2013  . Transient ischemic attack (TIA), and  cerebral infarction without residual deficits(V12.54) 2008  . Shoulder pain 03/30/2013  . Hypertension   . Depression   . Finger numbness 12/28/2013    Left thumb, 1, 2nd fingers  . Dysphagia S/P CVA (cerebrovascular accident) 02/14/2014  . CHF (congestive heart failure)   . Stroke    Past Surgical History  Procedure Laterality Date  . Appendectomy    . Tonsillectomy and adenoidectomy    . Cataract extraction    . Cardiac catheterization  1996, 2001, 2012  . Carotid stent      diagonal branch of LCA   Family History  Problem Relation Age of Onset  . Diabetes Sister   . Cancer Mother   . Stroke Father   . Diabetes Father   . Dementia Sister    History  Substance Use Topics  . Smoking status: Former Smoker    Quit date: 12/23/1963  . Smokeless tobacco: Never Used  . Alcohol Use: No   OB History    No data available     Review of Systems  Respiratory: Negative for cough and chest tightness.   Cardiovascular: Negative for chest pain.  Gastrointestinal: Positive for blood in stool.       Dark colored stool.  Musculoskeletal: Positive for back pain.  All other systems reviewed and are negative.     Allergies  Crestor; Ace inhibitors; and Plavix  Home Medications   Prior to Admission medications   Medication Sig Start Date End Date Taking? Authorizing  Provider  antiseptic oral rinse (BIOTENE) LIQD 15 mLs by Mouth Rinse route at bedtime.   Yes Historical Provider, MD  aspirin 325 MG tablet Take 1 tablet (325 mg total) by mouth daily. 02/03/14  Yes Eugenie Filler, MD  esomeprazole (NEXIUM) 40 MG capsule Take 40 mg by mouth daily.    Yes Historical Provider, MD  Fluocinolone Acetonide (DERMA-SMOOTHE/FS SCALP) 0.01 % OIL Apply 1 application topically 2 (two) times daily as needed (for scalp).   Yes Historical Provider, MD  furosemide (LASIX) 20 MG tablet Take 20 mg by mouth every other day.  03/14/14  Yes Historical Provider, MD  isosorbide mononitrate (IMDUR) 60 MG 24  hr tablet Take 60 mg by mouth at bedtime.   Yes Historical Provider, MD  losartan (COZAAR) 50 MG tablet Take 50 mg by mouth daily.   Yes Historical Provider, MD  loteprednol (LOTEMAX) 0.5 % ophthalmic suspension Place 1-2 drops into the left eye 2 (two) times daily as needed (for herpes zoster).    Yes Historical Provider, MD  metoprolol succinate (TOPROL-XL) 25 MG 24 hr tablet Take 25 mg by mouth daily.   Yes Historical Provider, MD  Multiple Vitamins-Minerals (ICAPS PO) Take 1 tablet by mouth daily.   Yes Historical Provider, MD  nitroGLYCERIN (NITROSTAT) 0.4 MG SL tablet Place 0.4 mg under the tongue every 5 (five) minutes as needed for chest pain.    Yes Historical Provider, MD  simvastatin (ZOCOR) 20 MG tablet Take 1 tablet (20 mg total) by mouth every other day. 05/30/14  Yes Claudette Jeri Cos, NP  triamcinolone (KENALOG) 0.025 % cream Apply 1 application topically 2 (two) times daily as needed (for itchy areas).    Yes Historical Provider, MD   BP 154/56 mmHg  Pulse 67  Temp(Src) 97.8 F (36.6 C) (Oral)  Resp 16  Ht 5\' 2"  (1.575 m)  Wt 144 lb (65.318 kg)  BMI 26.33 kg/m2  SpO2 98% Physical Exam  Constitutional: She is oriented to person, place, and time. She appears well-developed and well-nourished. No distress.  HENT:  Head: Normocephalic.  Mouth/Throat: Oropharynx is clear and moist.  Eyes: Conjunctivae are normal. Pupils are equal, round, and reactive to light.  Neck: Neck supple.  Cardiovascular: Normal rate, regular rhythm and intact distal pulses.   Pulmonary/Chest: Effort normal and breath sounds normal. No respiratory distress.  Abdominal: Soft. Bowel sounds are normal. There is no tenderness.  Musculoskeletal: She exhibits no edema or tenderness.  Lymphadenopathy:    She has no cervical adenopathy.  Neurological: She is alert and oriented to person, place, and time.  Skin: Skin is warm and dry.  Psychiatric: She has a normal mood and affect.  Nursing note and vitals  reviewed.   ED Course  Procedures (including critical care time) Labs Review Labs Reviewed  CBC - Abnormal; Notable for the following:    RBC 5.30 (*)    HCT 46.8 (*)    All other components within normal limits  COMPREHENSIVE METABOLIC PANEL - Abnormal; Notable for the following:    GFR calc non Af Amer 59 (*)    GFR calc Af Amer 68 (*)    All other components within normal limits    Imaging Review No results found.   EKG Interpretation None     Review of records from nursing home, labs drawn at 0400 today,  reveal rbc 2.28, hemoglobin 6.4, hematocrit 19.8, which is a drop from 02/22/15 @1207  (rbc 2.69, hgb 7.4, hct 23.1).  She is  heme positive in stool today.  However, labs collected in the ED for this visit do not correlate (rbc 5.3, hgb 14.7, hct 46.8).  Will recollect to confirm.  Confirmation labs reveal hgb of 7.4, hct 23.2.  She is hemoccult positive.  Patient discussed with and seen by Dr. Eulis Foster.    Discussed patient with on-call provider with [facility] would like for patient to receive one unit of blood, then she may be returned to her care facility.  MDM   Final diagnoses:  None    Anemia    Norman Herrlich, NP 02/24/15 [place], MD 02/24/15 [phone]

## 2015-02-23 NOTE — ED Notes (Signed)
Assisted pt to bedpan

## 2015-02-23 NOTE — Progress Notes (Signed)
CSW met with pt at bedside. There was no family present. Pt confirms that she is from [place]. Pt states that she has been living at the facility for 11 years. Patient informed CSW that she is currently in the skilled nursing unit.  Per note, pt 's chief complaint for being in [place] is due to Medical Management of Chronic Issues.   Pt informed CSW that she had a stroke a year ago. Pt states that she is unable to walk or stand. She states that she needs assistance with completing her ADL's.   Pt states that she does not have any questions at this time.  Willette Brace [phone] ED CSW 02/23/2015 10:15 PM

## 2015-02-23 NOTE — ED Notes (Signed)
Bed: QH47 Expected date:  Expected time:  Means of arrival:  Comments: EMS GI Bleed- Low HGB

## 2015-02-23 NOTE — ED Notes (Signed)
Report given to Lenna Sciara, Therapist, sports at [facility]

## 2015-02-23 NOTE — ED Notes (Signed)
Per EMS they were contacted by [facility] d/t abnormal HGB 6.4. Pt c/o dark stools, she denies other complaints. Pt reports having some sob the other night

## 2015-02-24 DIAGNOSIS — F99 Mental disorder, not otherwise specified: Secondary | ICD-10-CM | POA: Diagnosis not present

## 2015-02-24 DIAGNOSIS — D649 Anemia, unspecified: Secondary | ICD-10-CM | POA: Diagnosis not present

## 2015-02-25 LAB — TYPE AND SCREEN
ABO/RH(D): O POS
Antibody Screen: NEGATIVE
Unit division: 0

## 2015-02-26 ENCOUNTER — Telehealth: Payer: Self-pay | Admitting: Cardiology

## 2015-02-26 ENCOUNTER — Encounter: Payer: Self-pay | Admitting: Adult Health

## 2015-02-26 ENCOUNTER — Non-Acute Institutional Stay (SKILLED_NURSING_FACILITY): Payer: Medicare Other | Admitting: Adult Health

## 2015-02-26 DIAGNOSIS — D500 Iron deficiency anemia secondary to blood loss (chronic): Secondary | ICD-10-CM | POA: Insufficient documentation

## 2015-02-26 DIAGNOSIS — R195 Other fecal abnormalities: Secondary | ICD-10-CM | POA: Diagnosis not present

## 2015-02-26 NOTE — Progress Notes (Signed)
Patient ID: Sonya [name], female   DOB: [birth year], 81 y.o.   MRN: [id number]     Nursing Home Location:  [facility]   Code Status: DNR   Place of Service: SNF (31)  Chief Complaint  Patient presents with  . Acute Visit    f/u blood transfusion    HPI:  81 y.o.  Female residing at [facility], skilled care section.  She has a hx of CVA with left sided weakness, CAD with stents, HTN, asthma, CHF, dysphagia, anemia, and constipation. She was sent to the ER on 02/23/15 due to a Hgb of 6.4. I saw her on 02/22/15 and she reported restless legs so a CBC was performed. She received 1 unit of PRBCs. She denies any SOB, DOE, CP, bloody stools, or fatigue. She states that she feels fine and does not wish to return to the hospital. She denies restless legs today  Review of Systems:  Review of Systems  Constitutional: Negative for fever, chills, activity change and appetite change.  HENT: Positive for rhinorrhea and trouble swallowing. Negative for congestion and sore throat.   Respiratory: Negative for cough, chest tightness, shortness of breath and wheezing.   Cardiovascular: Positive for leg swelling. Negative for chest pain and palpitations.  Gastrointestinal: Negative for abdominal pain, constipation and abdominal distention.  Genitourinary:       Incontinent  Musculoskeletal: Positive for gait problem.       Uses wc and scooter  Skin: Negative for rash and wound.  Neurological: Positive for facial asymmetry. Negative for dizziness, tremors, seizures, speech difficulty and weakness.  Psychiatric/Behavioral: Negative for behavioral problems and agitation.   Medications: Patient's Medications  New Prescriptions   No medications on file  Previous Medications   ANTISEPTIC ORAL RINSE (BIOTENE) LIQD    15 mLs by Mouth Rinse route at bedtime.   ASPIRIN 325 MG TABLET    Take 1 tablet (325 mg total) by mouth daily.   ESOMEPRAZOLE (NEXIUM) 40 MG  CAPSULE    Take 40 mg by mouth daily.    FLUOCINOLONE ACETONIDE (DERMA-SMOOTHE/FS SCALP) 0.01 % OIL    Apply 1 application topically 2 (two) times daily as needed (for scalp).   FUROSEMIDE (LASIX) 20 MG TABLET    Take 20 mg by mouth every other day.    ISOSORBIDE MONONITRATE (IMDUR) 60 MG 24 HR TABLET    Take 60 mg by mouth at bedtime.   LOSARTAN (COZAAR) 50 MG TABLET    Take 50 mg by mouth daily.   LOTEPREDNOL (LOTEMAX) 0.5 % OPHTHALMIC SUSPENSION    Place 1-2 drops into the left eye 2 (two) times daily as needed (for herpes zoster).    METOPROLOL SUCCINATE (TOPROL-XL) 25 MG 24 HR TABLET    Take 25 mg by mouth daily.   MULTIPLE VITAMINS-MINERALS (ICAPS PO)    Take 1 tablet by mouth daily.   NITROGLYCERIN (NITROSTAT) 0.4 MG SL TABLET    Place 0.4 mg under the tongue every 5 (five) minutes as needed for chest pain.    SIMVASTATIN (ZOCOR) 20 MG TABLET    Take 1 tablet (20 mg total) by mouth every other day.   TRIAMCINOLONE (KENALOG) 0.025 % CREAM    Apply 1 application topically 2 (two) times daily as needed (for itchy areas).   Modified Medications   No medications on file  Discontinued Medications   No medications on file     Physical Exam:  Filed Vitals:   02/26/15 1307  BP:  138/72  Pulse: 79  Temp: 97.5 F (36.4 C)  Resp: 20  SpO2: 99%    Physical Exam  Constitutional: She is oriented to person, place, and time. No distress.  frail  Neck: No JVD present. No tracheal deviation present.  Cardiovascular: Normal rate.   No murmur heard. Pulmonary/Chest: Effort normal. No respiratory distress. She has no wheezes.  Decreased bs  Abdominal: Soft. Bowel sounds are normal. She exhibits no distension.  Musculoskeletal: She exhibits no edema or tenderness.  Lymphadenopathy:    She has no cervical adenopathy.  Neurological: She is alert and oriented to person, place, and time.  Skin: Skin is warm and dry. She is not diaphoretic. No erythema.  Psychiatric: Affect normal.    Labs  reviewed/Significant Diagnostic Results:  02/01/14 2D echo Study Conclusions  - Left ventricle: The cavity size was normal. There was moderate concentric hypertrophy. Systolic function was normal. The estimated ejection fraction was in the range of 55% to 60%. Wall motion was normal; there were no regional wall motion abnormalities. Doppler parameters are consistent with abnormal left ventricular relaxation (grade 1 diastolic dysfunction). Doppler parameters are consistent with elevated ventricular end-diastolic filling pressure.  Basic Metabolic Panel:  Recent Labs  05/11/14 08/22/14 02/23/15 1419  NA 140 142 139  K 4.6 4.5 5.0  CL  --   --  106  CO2  --   --  25  GLUCOSE  --   --  85  BUN 25* 26* 23  CREATININE 0.8 0.8 0.80  CALCIUM  --   --  8.6   Liver Function Tests:  Recent Labs  05/11/14 02/23/15 1419  AST 17 25  ALT 23 14  ALKPHOS 82 85  BILITOT  --  0.4  PROT  --  6.9  ALBUMIN  --  3.7   No results for input(s): LIPASE, AMYLASE in the last 8760 hours. No results for input(s): AMMONIA in the last 8760 hours. CBC:  Recent Labs  02/23/15 02/23/15 1419 02/23/15 1604 02/23/15 1806  WBC 9.4 4.5  --  9.0  HGB 6.4* 14.7 7.3* 7.4*  HCT 20* 46.8* 23.1* 23.2*  MCV  --  88.3  --  87.5  PLT 393 232  --  439*   CBG: No results for input(s): GLUCAP in the last 8760 hours. TSH:  Recent Labs  08/22/14 09/16/14 02/23/15  TSH 0.30* 0.40* 0.67   A1C: Lab Results  Component Value Date   HGBA1C 6.1* 02/01/2014   Lipid Panel:  Recent Labs  05/23/14 08/22/14 02/23/15  CHOL 96 110 145  HDL 43 48 45  LDLCALC 37 47 82  TRIG 78 77 141   09/19/14: TSH 0.443, free T4 1.06, free T3 2.8 02/23/15: Iron 10, Ferritin 14   Assessment/Plan  1. Anemia due to chronic blood loss Recheck CBC, collect two more hemoccults. Would not be aggressive with a GI work up due to her advanced age.  Begin ferrous sulfate 325mg  daily.  2. Heme + stool I  recommended that we stop her ASA due to heme pos stool with anemia. Tammy, RN will be consulting with Dr. Marlou Porch, her cardiologist, per the resident's request. She has a hx of cardiac stents and CVA, but due to her advanced age it seems reasonable to hold it. Obtain two more hemoccults.   Cindi Carbon, ANP [facility] [phone]

## 2015-02-26 NOTE — Telephone Encounter (Signed)
It is okay with me to stop the aspirin especially since recent hemoglobin was so low, stool positive for blood. Thank you for update.

## 2015-02-26 NOTE — Telephone Encounter (Signed)
Melissa aware to orders to stop ASA per Dr Marlou Porch

## 2015-02-26 NOTE — Telephone Encounter (Signed)
New problem   Need to know if pt need to continue her Aspirin 325mg  once a day due to her stool is positive for blood. Pt went to ER due to her hemoglobin was 6.4 and she had one unit of blood in ED. Please advise.

## 2015-02-27 DIAGNOSIS — D649 Anemia, unspecified: Secondary | ICD-10-CM | POA: Diagnosis not present

## 2015-02-27 LAB — CBC AND DIFFERENTIAL
HEMATOCRIT: 25 % — AB (ref 36–46)
Hemoglobin: 7.7 g/dL — AB (ref 12.0–16.0)

## 2015-03-06 ENCOUNTER — Non-Acute Institutional Stay (SKILLED_NURSING_FACILITY): Payer: Medicare Other | Admitting: Adult Health

## 2015-03-06 ENCOUNTER — Encounter: Payer: Self-pay | Admitting: Adult Health

## 2015-03-06 DIAGNOSIS — D649 Anemia, unspecified: Secondary | ICD-10-CM | POA: Diagnosis not present

## 2015-03-06 DIAGNOSIS — R195 Other fecal abnormalities: Secondary | ICD-10-CM

## 2015-03-06 DIAGNOSIS — D500 Iron deficiency anemia secondary to blood loss (chronic): Secondary | ICD-10-CM

## 2015-03-06 NOTE — Progress Notes (Signed)
Patient ID: Sonya [name], female   DOB: 09/10/1913, 81 y.o.   MRN: [id number]     Nursing Home Location:  [facility]   Code Status: DNR   Place of Service: SNF (31)  Chief Complaint  Patient presents with  . Acute Visit    f/u anemia    HPI:  81 y.o.  Female residing at [facility], skilled care section.  She has a hx of CVA with left sided weakness, CAD with stents, HTN, asthma, CHF, dysphagia, anemia, and constipation. She was sent to the ER on 02/23/15 due to a Hgb of 6.4. I saw her on 02/22/15 and she reported restless legs so a CBC was performed. She received 1 unit of PRBCs. She denies any SOB, DOE, CP, bloody stools, or fatigue. She denies any abd pain, bloody stools, decreased appetite, restless legs or fatigue. She is currently on iron and tolerating this well. Sh  Review of Systems:  Review of Systems  Constitutional: Negative for fever, chills, activity change and appetite change.  HENT: Positive for rhinorrhea and trouble swallowing. Negative for congestion and sore throat.   Respiratory: Negative for cough, chest tightness, shortness of breath and wheezing.   Cardiovascular: Positive for leg swelling. Negative for chest pain and palpitations.  Gastrointestinal: Negative for abdominal pain, constipation, blood in stool, abdominal distention and anal bleeding.  Genitourinary:       Incontinent  Musculoskeletal: Positive for gait problem.       Uses wc and scooter  Skin: Negative for rash and wound.  Neurological: Positive for facial asymmetry. Negative for dizziness, tremors, seizures, speech difficulty and weakness.  Psychiatric/Behavioral: Negative for behavioral problems and agitation.   Medications: Patient's Medications  New Prescriptions   No medications on file  Previous Medications   ANTISEPTIC ORAL RINSE (BIOTENE) LIQD    15 mLs by Mouth Rinse route at bedtime.   ESOMEPRAZOLE (NEXIUM) 40 MG CAPSULE    Take 40 mg by  mouth daily.    FLUOCINOLONE ACETONIDE (DERMA-SMOOTHE/FS SCALP) 0.01 % OIL    Apply 1 application topically 2 (two) times daily as needed (for scalp).   FUROSEMIDE (LASIX) 20 MG TABLET    Take 20 mg by mouth every other day.    ISOSORBIDE MONONITRATE (IMDUR) 60 MG 24 HR TABLET    Take 60 mg by mouth at bedtime.   LOSARTAN (COZAAR) 50 MG TABLET    Take 50 mg by mouth daily.   LOTEPREDNOL (LOTEMAX) 0.5 % OPHTHALMIC SUSPENSION    Place 1-2 drops into the left eye 2 (two) times daily as needed (for herpes zoster).    METOPROLOL SUCCINATE (TOPROL-XL) 25 MG 24 HR TABLET    Take 25 mg by mouth daily.   MULTIPLE VITAMINS-MINERALS (ICAPS PO)    Take 1 tablet by mouth daily.   NITROGLYCERIN (NITROSTAT) 0.4 MG SL TABLET    Place 0.4 mg under the tongue every 5 (five) minutes as needed for chest pain.    SIMVASTATIN (ZOCOR) 20 MG TABLET    Take 1 tablet (20 mg total) by mouth every other day.   TRIAMCINOLONE (KENALOG) 0.025 % CREAM    Apply 1 application topically 2 (two) times daily as needed (for itchy areas).   Modified Medications   No medications on file  Discontinued Medications   No medications on file     Physical Exam:  Filed Vitals:   03/06/15 1134  BP: 136/80  Pulse: 79  Temp: 97.5 F (36.4 C)  Resp: 18  Weight: 143 lb 11.2 oz (65.182 kg)  SpO2: 99%    Physical Exam  Constitutional: She is oriented to person, place, and time. No distress.  frail  Neck: No JVD present. No tracheal deviation present.  Cardiovascular: Normal rate.   No murmur heard. Pulmonary/Chest: Effort normal. No respiratory distress. She has no wheezes.  Decreased bs  Abdominal: Soft. Bowel sounds are normal. She exhibits no distension and no mass. There is no tenderness. There is no rebound and no guarding.  Musculoskeletal: She exhibits no edema or tenderness.  Lymphadenopathy:    She has no cervical adenopathy.  Neurological: She is alert and oriented to person, place, and time.  Skin: Skin is warm and  dry. She is not diaphoretic. No erythema.  Psychiatric: Affect normal.    Labs reviewed/Significant Diagnostic Results:  02/01/14 2D echo Study Conclusions  - Left ventricle: The cavity size was normal. There was moderate concentric hypertrophy. Systolic function was normal. The estimated ejection fraction was in the range of 55% to 60%. Wall motion was normal; there were no regional wall motion abnormalities. Doppler parameters are consistent with abnormal left ventricular relaxation (grade 1 diastolic dysfunction). Doppler parameters are consistent with elevated ventricular end-diastolic filling pressure.  Basic Metabolic Panel:  Recent Labs  08/22/14 02/23/15 02/23/15 1419  NA 142 139 139  K 4.5 4.8 5.0  CL  --   --  106  CO2  --   --  25  GLUCOSE  --   --  85  BUN 26* 19 23  CREATININE 0.8 0.7 0.80  CALCIUM  --   --  8.6   Liver Function Tests:  Recent Labs  05/11/14 02/23/15 02/23/15 1419  AST 17 15 25   ALT 23 10 14   ALKPHOS 82 81 85  BILITOT  --   --  0.4  PROT  --   --  6.9  ALBUMIN  --   --  3.7   No results for input(s): LIPASE, AMYLASE in the last 8760 hours. No results for input(s): AMMONIA in the last 8760 hours. CBC:  Recent Labs  02/23/15 02/23/15 1419 02/23/15 1604 02/23/15 1806 02/27/15  WBC 9.4 4.5  --  9.0  --   HGB 6.4* 14.7 7.3* 7.4* 7.7*  HCT 20* 46.8* 23.1* 23.2* 25*  MCV  --  88.3  --  87.5  --   PLT 393 232  --  439*  --    CBG: No results for input(s): GLUCAP in the last 8760 hours. TSH:  Recent Labs  08/22/14 09/16/14 02/23/15  TSH 0.30* 0.40* 0.67   A1C: Lab Results  Component Value Date   HGBA1C 6.1* 02/01/2014   Lipid Panel:  Recent Labs  05/23/14 08/22/14 02/23/15  CHOL 96 110 145  HDL 43 48 45  LDLCALC 37 47 82  TRIG 78 77 141   09/19/14: TSH 0.443, free T4 1.06, free T3 2.8 02/23/15: Iron 10, Ferritin 14   Assessment/Plan  1. Anemia due to chronic blood loss Recheck CBC today. Improved  Hgb after blood transfusion. Continue ferrous sulfate and nexium.  2. Heme + stool We have stopped the ASA and there have been no more bloody stools noted. She is still due for one more hemoccult. The resident has declined an aggressive work up with GI and this is reasonable given her age.   Cindi Carbon, ANP [facility] [phone]

## 2015-03-22 DIAGNOSIS — H18422 Band keratopathy, left eye: Secondary | ICD-10-CM | POA: Diagnosis not present

## 2015-03-22 DIAGNOSIS — Z961 Presence of intraocular lens: Secondary | ICD-10-CM | POA: Diagnosis not present

## 2015-03-22 DIAGNOSIS — H3532 Exudative age-related macular degeneration: Secondary | ICD-10-CM | POA: Diagnosis not present

## 2015-03-28 ENCOUNTER — Non-Acute Institutional Stay (SKILLED_NURSING_FACILITY): Payer: Medicare Other | Admitting: Internal Medicine

## 2015-03-28 ENCOUNTER — Encounter: Payer: Self-pay | Admitting: Internal Medicine

## 2015-03-28 DIAGNOSIS — H3530 Unspecified macular degeneration: Secondary | ICD-10-CM | POA: Diagnosis not present

## 2015-03-28 DIAGNOSIS — I2583 Coronary atherosclerosis due to lipid rich plaque: Secondary | ICD-10-CM

## 2015-03-28 DIAGNOSIS — I639 Cerebral infarction, unspecified: Secondary | ICD-10-CM

## 2015-03-28 DIAGNOSIS — I69391 Dysphagia following cerebral infarction: Secondary | ICD-10-CM

## 2015-03-28 DIAGNOSIS — I5032 Chronic diastolic (congestive) heart failure: Secondary | ICD-10-CM

## 2015-03-28 DIAGNOSIS — I6789 Other cerebrovascular disease: Secondary | ICD-10-CM | POA: Diagnosis not present

## 2015-03-28 DIAGNOSIS — I2510 Atherosclerotic heart disease of native coronary artery without angina pectoris: Secondary | ICD-10-CM

## 2015-03-28 NOTE — Progress Notes (Signed)
Patient ID: Sonya [name], female   DOB: 26-Dec-1912, 81 y.o.   MRN: [id number]  Location:  [facility] Provider:  Rexene Edison. Mariea Clonts, D.O., C.M.D.  Code Status:  DNR Advanced Directive information Does patient have an advance directive?: Yes, Type of Advance Directive: Living will;Out of facility DNR (pink MOST or yellow form), Pre-existing out of facility DNR order (yellow form or pink MOST form): Yellow form placed in chart (order not valid for inpatient use), Does patient want to make changes to advanced directive?: No - Patient declined  Chief Complaint  Patient presents with  . Medical Management of Chronic Issues    HPI:  81 yo female SNF resident seen for medical mgt of her chronic diseases.  She has a h/o chronic pain, chronic diastolic chf, IBS, TIA, macular degeneration, prior stroke, dysphagia, dysarthria, aphasia.  She c/o itchy, runny, dry eyes.  She has an ophthalmologist she follows with regularly.  She tells me that constipation has not recently been problematic since some friends brought her chocolate covered raisins.  She can only eat a few or she gets diarrhea.  She has been sharing them with visitors.  She denies shortness of breath.  She has to be careful with her swallowing especially for pills.  Review of Systems:  Review of Systems  Constitutional: Negative for fever and chills.  HENT: Negative for congestion.   Eyes: Positive for discharge and redness.       Itching  Respiratory: Negative for shortness of breath.   Cardiovascular: Negative for chest pain.  Gastrointestinal: Negative for abdominal pain, constipation, blood in stool and melena.  Genitourinary: Negative for dysuria, urgency and frequency.  Musculoskeletal: Positive for myalgias, back pain and joint pain. Negative for falls.  Neurological: Negative for dizziness and loss of consciousness.  Endo/Heme/Allergies: Bruises/bleeds easily.  Psychiatric/Behavioral:       Flat affect     Medications: Patient's Medications  New Prescriptions   No medications on file  Previous Medications   ANTISEPTIC ORAL RINSE (BIOTENE) LIQD    15 mLs by Mouth Rinse route at bedtime.   ESOMEPRAZOLE (NEXIUM) 40 MG CAPSULE    Take 40 mg by mouth daily.    FLUOCINOLONE ACETONIDE (DERMA-SMOOTHE/FS SCALP) 0.01 % OIL    Apply 1 application topically 2 (two) times daily as needed (for scalp).   FUROSEMIDE (LASIX) 20 MG TABLET    Take 20 mg by mouth every other day.    ISOSORBIDE MONONITRATE (IMDUR) 60 MG 24 HR TABLET    Take 60 mg by mouth at bedtime.   LOSARTAN (COZAAR) 50 MG TABLET    Take 50 mg by mouth daily.   LOTEPREDNOL (LOTEMAX) 0.5 % OPHTHALMIC SUSPENSION    Place 1-2 drops into the left eye 2 (two) times daily as needed (for herpes zoster).    METOPROLOL SUCCINATE (TOPROL-XL) 25 MG 24 HR TABLET    Take 25 mg by mouth daily.   MULTIPLE VITAMINS-MINERALS (ICAPS PO)    Take 1 tablet by mouth daily.   NITROGLYCERIN (NITROSTAT) 0.4 MG SL TABLET    Place 0.4 mg under the tongue every 5 (five) minutes as needed for chest pain.    SIMVASTATIN (ZOCOR) 20 MG TABLET    Take 1 tablet (20 mg total) by mouth every other day.   TRIAMCINOLONE (KENALOG) 0.025 % CREAM    Apply 1 application topically 2 (two) times daily as needed (for itchy areas).   Modified Medications   No medications on file  Discontinued  Medications   No medications on file    Physical Exam: Filed Vitals:   03/28/15 1834  BP: 121/72  Pulse: 73  Temp: 98 F (36.7 C)  Resp: 20  Height: 4' 11.3" (1.506 m)  Weight: 144 lb (65.318 kg)  SpO2: 97%   Physical Exam  Constitutional: She appears well-developed and well-nourished. No distress.  Eyes:  Glasses; both areas are erythematous, watery, and she is trying to avoid itching them during visit--has eye drop from ophtho she needs renewed setting on her bedside table  Cardiovascular: Normal rate, regular rhythm, normal heart sounds and intact distal pulses.    Pulmonary/Chest: Effort normal and breath sounds normal. She has no rales.  Abdominal: Soft. Bowel sounds are normal. She exhibits no distension and no mass. There is no tenderness.  Musculoskeletal:  Uses wheelchair  Neurological: She is alert.  Skin: Skin is warm and dry. There is pallor.  Psychiatric:  Flat affect    Labs reviewed: Basic Metabolic Panel:  Recent Labs  08/22/14 02/23/15 02/23/15 1419  NA 142 139 139  K 4.5 4.8 5.0  CL  --   --  106  CO2  --   --  25  GLUCOSE  --   --  85  BUN 26* 19 23  CREATININE 0.8 0.7 0.80  CALCIUM  --   --  8.6    Liver Function Tests:  Recent Labs  05/11/14 02/23/15 02/23/15 1419  AST 17 15 25   ALT 23 10 14   ALKPHOS 82 81 85  BILITOT  --   --  0.4  PROT  --   --  6.9  ALBUMIN  --   --  3.7    CBC:  Recent Labs  02/23/15 02/23/15 1419 02/23/15 1604 02/23/15 1806 02/27/15  WBC 9.4 4.5  --  9.0  --   HGB 6.4* 14.7 7.3* 7.4* 7.7*  HCT 20* 46.8* 23.1* 23.2* 25*  MCV  --  88.3  --  87.5  --   PLT 393 232  --  439*  --     Assessment/Plan - DNR (Do Not Resuscitate)  2. Macular degeneration -f/u with ophthalmology and call to get refills on drops she needs -seems she may need some patanol for a week or two to  help with allergic conjunctivitis presently  3. Dysphagia S/P CVA (cerebrovascular accident) -cont modified diet and careful swallowing  4. CVA (cerebral vascular accident) -requires skilled care which she expresses makes her depressed  -uses wheelchair to get around  5. Chronic diastolic heart failure -cont lasix 20mg  daily, imdur, cozaar, toprol-xl  6. Coronary artery disease due to lipid rich plaque -cont zocor, bp regimen including bb, arb   Family/ staff Communication: discussed with nursing staff  Goals of care: long term snf resident, DNR, has living will but not yet scanned in media  Labs/tests ordered:  No new

## 2015-04-11 DIAGNOSIS — H18422 Band keratopathy, left eye: Secondary | ICD-10-CM | POA: Diagnosis not present

## 2015-04-11 DIAGNOSIS — H3532 Exudative age-related macular degeneration: Secondary | ICD-10-CM | POA: Diagnosis not present

## 2015-04-30 ENCOUNTER — Non-Acute Institutional Stay (SKILLED_NURSING_FACILITY): Payer: Medicare Other | Admitting: Adult Health

## 2015-04-30 DIAGNOSIS — I10 Essential (primary) hypertension: Secondary | ICD-10-CM | POA: Diagnosis not present

## 2015-04-30 DIAGNOSIS — K5900 Constipation, unspecified: Secondary | ICD-10-CM | POA: Diagnosis not present

## 2015-04-30 DIAGNOSIS — D500 Iron deficiency anemia secondary to blood loss (chronic): Secondary | ICD-10-CM | POA: Diagnosis not present

## 2015-04-30 DIAGNOSIS — I639 Cerebral infarction, unspecified: Secondary | ICD-10-CM

## 2015-04-30 DIAGNOSIS — I69391 Dysphagia following cerebral infarction: Secondary | ICD-10-CM | POA: Diagnosis not present

## 2015-05-01 ENCOUNTER — Ambulatory Visit (INDEPENDENT_AMBULATORY_CARE_PROVIDER_SITE_OTHER): Payer: Medicare Other | Admitting: Cardiology

## 2015-05-01 ENCOUNTER — Encounter: Payer: Self-pay | Admitting: Cardiology

## 2015-05-01 VITALS — BP 134/70 | HR 73 | Ht 59.3 in

## 2015-05-01 DIAGNOSIS — I639 Cerebral infarction, unspecified: Secondary | ICD-10-CM | POA: Diagnosis not present

## 2015-05-01 DIAGNOSIS — Z8673 Personal history of transient ischemic attack (TIA), and cerebral infarction without residual deficits: Secondary | ICD-10-CM | POA: Diagnosis not present

## 2015-05-01 DIAGNOSIS — I2510 Atherosclerotic heart disease of native coronary artery without angina pectoris: Secondary | ICD-10-CM

## 2015-05-01 DIAGNOSIS — I10 Essential (primary) hypertension: Secondary | ICD-10-CM | POA: Diagnosis not present

## 2015-05-01 DIAGNOSIS — I2583 Coronary atherosclerosis due to lipid rich plaque: Principal | ICD-10-CM

## 2015-05-01 NOTE — Progress Notes (Signed)
[place]. [address], [place]  [address] Phone: [phone] Fax:  [phone]  Date:  05/01/2015   ID:  SHAMIEKA GULLO, DOB 07-Sep-1913, MRN [id number]  PCP:  Hollace Kinnier, DO   History of Present Illness: Sonya [name] is a 81 y.o. female with coronary artery disease status post ST elevation myocardial infarction receiving an RCA stent with residual moderate LAD disease and obtuse marginal disease here for followup.  CAD-stable with no exertional anginal symptoms. She will occasionally have fleeting chest discomfort, this is usually relieved by burping. In the past her chest discomfort post catheterization/stent placement was relieved with Nexium. I will stop isosorbide. She can call if symptoms arise.   Stroke-unfortunately suffered a stroke in early 2015. Dr. Leonie Man. Dysarthria has improved slightly. Dysphagia has improved. Left-sided facial droop has improved. Aspirin  Hyperlipidemia-in the past, she has not tolerated statins due to leg weakness.  GERD-doing well with Nexium. Seen Dr. Lamonte Sakai for chronic aspiration.    Wt Readings from Last 3 Encounters:  03/28/15 144 lb (65.318 kg)  03/06/15 143 lb 11.2 oz (65.182 kg)  02/23/15 144 lb (65.318 kg)     Past Medical History  Diagnosis Date  . Acute bronchitis   . Asthma   . TIA (transient ischemic attack)   . Herpes zoster complication 3716    complicated, left eye involvement  . Nontoxic uninodular goiter     negative [place] 2008  . Macular degeneration (senile) of retina, unspecified 2012    wet  . Acute myocardial infarction, unspecified site, episode of care unspecified 03/07/11  . Coronary atherosclerosis of native coronary artery      s/p stent diagonal branch 1996; RCA 03/07/11  . Chronic diastolic heart failure   . Cardiomegaly   . Diaphragmatic hernia without mention of obstruction or gangrene   . Irritable bowel syndrome   . Other specified genital prolapse(618.89)     corrected with  pessary  . Edema 2006  . Other abnormal blood chemistry   . Debility, unspecified   . Open wound of knee, leg (except thigh), and ankle, without mention of complication 96/7893    left leg, complicated wound, extended healing, resolved 01/2013  . Transient ischemic attack (TIA), and cerebral infarction without residual deficits(V12.54) 2008  . Shoulder pain 03/30/2013  . Hypertension   . Depression   . Finger numbness 12/28/2013    Left thumb, 1, 2nd fingers  . Dysphagia S/P CVA (cerebrovascular accident) 02/14/2014  . CHF (congestive heart failure)   . Stroke     Past Surgical History  Procedure Laterality Date  . Appendectomy    . Tonsillectomy and adenoidectomy    . Cataract extraction    . Cardiac catheterization  1996, 2001, 2012  . Carotid stent      diagonal branch of LCA    Current Outpatient Prescriptions  Medication Sig Dispense Refill  . antiseptic oral rinse (BIOTENE) LIQD 15 mLs by Mouth Rinse route at bedtime.    Marland Kitchen esomeprazole (NEXIUM) 40 MG capsule Take 40 mg by mouth daily.     . ferrous sulfate 325 (65 FE) MG tablet Take 325 mg by mouth daily with breakfast.    . Fluocinolone Acetonide (DERMA-SMOOTHE/FS SCALP) 0.01 % OIL Apply 1 application topically 2 (two) times daily as needed (for scalp).    . furosemide (LASIX) 20 MG tablet Take 20 mg by mouth every other day.     . isosorbide mononitrate (  IMDUR) 60 MG 24 hr tablet Take 60 mg by mouth at bedtime.    Marland Kitchen losartan (COZAAR) 50 MG tablet Take 50 mg by mouth daily.    Marland Kitchen loteprednol (LOTEMAX) 0.5 % ophthalmic suspension Place 1-2 drops into the left eye 2 (two) times daily as needed (for herpes zoster).     . metoprolol succinate (TOPROL-XL) 25 MG 24 hr tablet Take 25 mg by mouth daily.    . Multiple Vitamins-Minerals (ICAPS PO) Take 1 tablet by mouth daily.    . nitroGLYCERIN (NITROSTAT) 0.4 MG SL tablet Place 0.4 mg under the tongue every 5 (five) minutes as needed for chest pain.     . simvastatin (ZOCOR) 20 MG  tablet Take 1 tablet (20 mg total) by mouth every other day.    . triamcinolone (KENALOG) 0.025 % cream Apply 1 application topically 2 (two) times daily as needed (for itchy areas).      No current facility-administered medications for this visit.    Allergies:    Allergies  Allergen Reactions  . Crestor [Rosuvastatin] Other (See Comments)    Leg Weakness   . Ace Inhibitors Cough  . Plavix [Clopidogrel] Nausea And Vomiting    Social History:  The patient  reports that she quit smoking about 51 years ago. She has never used smokeless tobacco. She reports that she does not drink alcohol or use illicit drugs.   ROS:  Please see the history of present illness.   Denies any fevers, chills. Recent leg wound. Occasional rare chest pain.   PHYSICAL EXAM: VS:  BP 134/70 mmHg  Pulse 73  Ht 4' 11.3" (1.506 m)  Wt  Well nourished, well developed, in no acute distress elderly,in wheelchair. HEENT: normal Neck: no JVD Cardiac:  normal S1, S2; RRR; no murmur Lungs:  clear to auscultation bilaterally, no wheezing, rhonchi or rales Abd: soft, nontender, no hepatomegaly Ext: no edema Skin: Right leg improved no edema. Neuro: Very minimal left facial droop. Dysarthria noted.  EKG:  05/01/15-sinus rhythm, 73, old inferior infarct pattern, nonspecific T-wave changes-prior Normal rhythm 63, T wave inversion inferior leads mostly but scattered throughout EKG.     ASSESSMENT AND PLAN:  1. Stroke-01/2014- symptom was trouble speaking, dysarthria. Dysphagia and facial droop have improved. Aspirin has been stopped because of anemia incident where hemoglobin was 7.4. She received transfusion.. Seen Dr. Lamonte Sakai for aspiration pneumonia. Thickened liquids. Stopped Advair and this seemed to help as well. 2. Coronary artery disease status post MI-overall doing well. Minor stable angina. Will stop isosorbide. Aspirin stopped because of severe anemia. She has been taken off of her Crestor. Lipids have been  excellent. Given her advanced age, had no problems with that. 3. Hypertension-improved. Was previously higher in the morning hours. Medications reviewed. 4. Chronic diastolic heart failure-doing well, stable. No shortness of breath. 5. See her back in 4 months  Signed, Candee Furbish, MD [facility]  05/01/2015 3:42 PM

## 2015-05-01 NOTE — Patient Instructions (Signed)
Medication Instructions:  Please stop your Isosorbide. Continue all other medications as listed.  Follow-Up: Follow up in 4 months with Dr Marlou Porch.  Thank you for choosing [facility]!!

## 2015-05-02 ENCOUNTER — Encounter: Payer: Self-pay | Admitting: Adult Health

## 2015-05-02 NOTE — Progress Notes (Signed)
Patient ID: Sonya [name], female   DOB: 24-Feb-1913, 81 y.o.   MRN: [id number]     Nursing Home Location:  [facility]   Code Status: DNR   Place of Service: SNF (31)  Chief Complaint  Patient presents with  . Medical Management of Chronic Issues    HPI:  81 y.o.  Female residing at [facility], skilled care section.  She has a hx of CVA with left sided weakness, CAD with stents, HTN, asthma, CHF, dysphagia, anemia, and constipation. Her VS have been stable over the past month. She has had multiple issues with the staff regarding her care and meds. She frequently refuses meds and is unkind to the staff. I saw her today to discuss her anemia. She stated that she wanted to come off Nexium. I let her know that this should continue because she had a recent GI bleed and required transfusion in the ER. We both agreed at that time not to work it up (due to her age) but to keep her on nexium and stop the plavix (in coordination with cardiology).  Today she does not seem to remember this conversation and is not interested in my recommendations.   Her weight has been stable over the past month at 143 lbs. She denies any SOB, CP, DOE, abd pain, indigestion, or blood in her stools.  Review of Systems:  Review of Systems  Constitutional: Negative for fever, chills, activity change and appetite change.  HENT: Positive for rhinorrhea and trouble swallowing. Negative for congestion and sore throat.   Respiratory: Negative for cough, chest tightness, shortness of breath and wheezing.   Cardiovascular: Positive for leg swelling. Negative for chest pain and palpitations.  Gastrointestinal: Negative for abdominal pain, constipation, blood in stool, abdominal distention and anal bleeding.  Genitourinary:       Incontinent  Musculoskeletal: Positive for gait problem.       Uses wc and scooter  Skin: Negative for rash and wound.  Neurological: Positive for facial  asymmetry. Negative for dizziness, tremors, seizures, speech difficulty and weakness.  Psychiatric/Behavioral: Negative for behavioral problems and agitation.   Medications: Patient's Medications  New Prescriptions   No medications on file  Previous Medications   ANTISEPTIC ORAL RINSE (BIOTENE) LIQD    15 mLs by Mouth Rinse route at bedtime.   ESOMEPRAZOLE (NEXIUM) 40 MG CAPSULE    Take 40 mg by mouth daily.    FERROUS SULFATE 325 (65 FE) MG TABLET    Take 325 mg by mouth daily with breakfast.   FLUOCINOLONE ACETONIDE (DERMA-SMOOTHE/FS SCALP) 0.01 % OIL    Apply 1 application topically 2 (two) times daily as needed (for scalp).   FUROSEMIDE (LASIX) 20 MG TABLET    Take 20 mg by mouth every other day.    LOSARTAN (COZAAR) 50 MG TABLET    Take 50 mg by mouth daily.   LOTEPREDNOL (LOTEMAX) 0.5 % OPHTHALMIC SUSPENSION    Place 1-2 drops into the left eye 2 (two) times daily as needed (for herpes zoster).    METOPROLOL SUCCINATE (TOPROL-XL) 25 MG 24 HR TABLET    Take 25 mg by mouth daily.   MULTIPLE VITAMINS-MINERALS (ICAPS PO)    Take 1 tablet by mouth daily.   NITROGLYCERIN (NITROSTAT) 0.4 MG SL TABLET    Place 0.4 mg under the tongue every 5 (five) minutes as needed for chest pain.    SIMVASTATIN (ZOCOR) 20 MG TABLET    Take 1 tablet (20 mg  total) by mouth every other day.   TRIAMCINOLONE (KENALOG) 0.025 % CREAM    Apply 1 application topically 2 (two) times daily as needed (for itchy areas).   Modified Medications   No medications on file  Discontinued Medications   ISOSORBIDE MONONITRATE (IMDUR) 60 MG 24 HR TABLET    Take 60 mg by mouth at bedtime.     Physical Exam:  Filed Vitals:   05/02/15 1351  BP: 122/73  Pulse: 78  Temp: 97.7 F (36.5 C)  Resp: 14  Weight: 143 lb 12.8 oz (65.227 kg)  SpO2: 94%   Wt Readings from Last 3 Encounters:  05/02/15 143 lb 12.8 oz (65.227 kg)  03/28/15 144 lb (65.318 kg)  03/06/15 143 lb 11.2 oz (65.182 kg)    Physical Exam She refused her  exam stating that there was no point.  Labs reviewed/Significant Diagnostic Results:  02/01/14 2D echo Study Conclusions  - Left ventricle: The cavity size was normal. There was moderate concentric hypertrophy. Systolic function was normal. The estimated ejection fraction was in the range of 55% to 60%. Wall motion was normal; there were no regional wall motion abnormalities. Doppler parameters are consistent with abnormal left ventricular relaxation (grade 1 diastolic dysfunction). Doppler parameters are consistent with elevated ventricular end-diastolic filling pressure.  Basic Metabolic Panel:  Recent Labs  08/22/14 02/23/15 02/23/15 1419  NA 142 139 139  K 4.5 4.8 5.0  CL  --   --  106  CO2  --   --  25  GLUCOSE  --   --  85  BUN 26* 19 23  CREATININE 0.8 0.7 0.80  CALCIUM  --   --  8.6   Liver Function Tests:  Recent Labs  05/11/14 02/23/15 02/23/15 1419  AST 17 15 25   ALT 23 10 14   ALKPHOS 82 81 85  BILITOT  --   --  0.4  PROT  --   --  6.9  ALBUMIN  --   --  3.7   No results for input(s): LIPASE, AMYLASE in the last 8760 hours. No results for input(s): AMMONIA in the last 8760 hours. CBC:  Recent Labs  02/23/15 02/23/15 1419 02/23/15 1604 02/23/15 1806 02/27/15  WBC 9.4 4.5  --  9.0  --   HGB 6.4* 14.7 7.3* 7.4* 7.7*  HCT 20* 46.8* 23.1* 23.2* 25*  MCV  --  88.3  --  87.5  --   PLT 393 232  --  439*  --    CBG: No results for input(s): GLUCAP in the last 8760 hours. TSH:  Recent Labs  08/22/14 09/16/14 02/23/15  TSH 0.30* 0.40* 0.67   A1C: Lab Results  Component Value Date   HGBA1C 6.1* 02/01/2014   Lipid Panel:  Recent Labs  05/23/14 08/22/14 02/23/15  CHOL 96 110 145  HDL 43 48 45  LDLCALC 37 47 82  TRIG 78 77 141   09/19/14: TSH 0.443, free T4 1.06, free T3 2.8 02/23/15: Iron 10, Ferritin 14   Assessment/Plan  1. Dysphagia S/P CVA (cerebrovascular accident) Tolerating diet well with no bouts of aspiration.  Has completed ST  2. Essential hypertension Stable on current meds  3. CVA (cerebral vascular accident) No on asa or plavix due to GI bleed.  Continues on a statin.  No new events noted.   4. Constipation, unspecified constipation type Stable on current regimen  5. Anemia due to chronic blood loss No new reports of bleeding. Monitor CBC and continue  iron. She should continue her nexium but she reports that she wants it discontinued and will discuss this with Dr. Toney Reil, [place] [phone]

## 2015-05-24 DIAGNOSIS — H18422 Band keratopathy, left eye: Secondary | ICD-10-CM | POA: Diagnosis not present

## 2015-05-24 DIAGNOSIS — Z961 Presence of intraocular lens: Secondary | ICD-10-CM | POA: Diagnosis not present

## 2015-05-24 DIAGNOSIS — H3532 Exudative age-related macular degeneration: Secondary | ICD-10-CM | POA: Diagnosis not present

## 2015-06-22 ENCOUNTER — Telehealth: Payer: Self-pay | Admitting: Cardiology

## 2015-06-22 NOTE — Telephone Encounter (Signed)
Will route this message to medical records to fax requested last OV the pt had with Dr Marlou Porch, to pts SN[facility]: [place] at [phone].

## 2015-06-22 NOTE — Telephone Encounter (Signed)
New Message   Melissa from [facility]; would like a call back so she can have information about past appt   Faxed over. [phone]  Attention Melissa:

## 2015-07-11 ENCOUNTER — Non-Acute Institutional Stay (SKILLED_NURSING_FACILITY): Payer: Medicare Other | Admitting: Adult Health

## 2015-07-11 DIAGNOSIS — I2510 Atherosclerotic heart disease of native coronary artery without angina pectoris: Secondary | ICD-10-CM | POA: Diagnosis not present

## 2015-07-11 DIAGNOSIS — I10 Essential (primary) hypertension: Secondary | ICD-10-CM

## 2015-07-11 DIAGNOSIS — F329 Major depressive disorder, single episode, unspecified: Secondary | ICD-10-CM

## 2015-07-11 DIAGNOSIS — D500 Iron deficiency anemia secondary to blood loss (chronic): Secondary | ICD-10-CM | POA: Diagnosis not present

## 2015-07-11 DIAGNOSIS — I69391 Dysphagia following cerebral infarction: Secondary | ICD-10-CM

## 2015-07-11 DIAGNOSIS — I639 Cerebral infarction, unspecified: Secondary | ICD-10-CM | POA: Diagnosis not present

## 2015-07-11 DIAGNOSIS — K5900 Constipation, unspecified: Secondary | ICD-10-CM

## 2015-07-11 DIAGNOSIS — I2583 Coronary atherosclerosis due to lipid rich plaque: Secondary | ICD-10-CM

## 2015-07-11 DIAGNOSIS — J3489 Other specified disorders of nose and nasal sinuses: Secondary | ICD-10-CM

## 2015-07-11 DIAGNOSIS — F32A Depression, unspecified: Secondary | ICD-10-CM

## 2015-07-11 NOTE — Progress Notes (Signed)
Patient ID: Sonya [name], female   DOB: 08-01-1913, 81 y.o.   MRN: [id number]     Nursing Home Location:  [facility]   Code Status: DNR   Place of Service: SNF (31)  Chief Complaint  Patient presents with  . Medical Management of Chronic Issues    HPI:  81 y.o.  Female residing at [facility], skilled care section.  She has a hx of CVA with left sided weakness, CAD, HTN, asthma, CHF, dysphagia, anemia, and constipation. Her VS have been stable over the past month. Her weight has remained stable at 144 lbs.  She denies any CP, SOB, bloody stool, abd pain, or DOE.  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 She also has wet MD in the right eye. She reports feeling lonely and bored at times and can be unkind to the staff. She has declined medication for this problem but has no SI and is eating and drinking well. She moves through the facility with a scooter and is continent of urine and stool most of the time per her accounts. She is going to be 89 in November. She also complains of chronic rhinorrhea and uses tissues throughout the day to wipe away clear drainage. She has declined meds for this problem. She was treated for chronic GI blood loss anemia with transfusion and iron earlier this year.  She currently denies any bloody stool or abd pain. She feels well and does not understand why we ask her questions about this issue.    Review of Systems:  Review of Systems  Constitutional: Negative for fever, chills, activity change and appetite change.  HENT: Positive for rhinorrhea and trouble swallowing. Negative for congestion and sore throat.   Respiratory: Negative for cough, chest tightness, shortness of breath and wheezing.   Cardiovascular: Positive for leg swelling. Negative for chest pain and palpitations.  Gastrointestinal: Negative for abdominal pain, constipation, blood in  stool, abdominal distention and anal bleeding.  Genitourinary:       Incontinent  Musculoskeletal: Positive for gait problem.       Uses wc and scooter  Skin: Negative for rash and wound.  Neurological: Positive for facial asymmetry. Negative for dizziness, tremors, seizures, speech difficulty and weakness.  Psychiatric/Behavioral: Negative for behavioral problems and agitation.   Medications: Patient's Medications  New Prescriptions   No medications on file  Previous Medications   ANTISEPTIC ORAL RINSE (BIOTENE) LIQD    15 mLs by Mouth Rinse route at bedtime.   ESOMEPRAZOLE (NEXIUM) 40 MG CAPSULE    Take 40 mg by mouth daily.    FERROUS SULFATE 325 (65 FE) MG TABLET    Take 325 mg by mouth daily with breakfast.   FLUOCINOLONE ACETONIDE (DERMA-SMOOTHE/FS SCALP) 0.01 % OIL    Apply 1 application topically 2 (two) times daily as needed (for scalp).   FUROSEMIDE (LASIX) 20 MG TABLET    Take 20 mg by mouth every other day.    LOSARTAN (COZAAR) 50 MG TABLET    Take 50 mg by mouth daily.   LOTEPREDNOL ETABONATE (LOTEMAX) 0.5 % GEL    Apply 0.5 inches to eye 2 (two) times daily. Left eye   METOPROLOL SUCCINATE (TOPROL-XL) 25 MG 24 HR TABLET    Take 25 mg by mouth daily.   MULTIPLE VITAMINS-MINERALS (ICAPS PO)    Take 1 tablet by mouth daily.   NITROGLYCERIN (NITROSTAT) 0.4 MG SL TABLET  Place 0.4 mg under the tongue every 5 (five) minutes as needed for chest pain.    SIMVASTATIN (ZOCOR) 20 MG TABLET    Take 1 tablet (20 mg total) by mouth every other day.   TRIAMCINOLONE (KENALOG) 0.025 % CREAM    Apply 1 application topically 2 (two) times daily as needed (for itchy areas).   Modified Medications   No medications on file  Discontinued Medications   LOTEPREDNOL (LOTEMAX) 0.5 % OPHTHALMIC SUSPENSION    Place 1-2 drops into the left eye 2 (two) times daily as needed (for herpes zoster).      Physical Exam:  Filed Vitals:   07/11/15 1057  BP: 127/69  Pulse: 79  Temp: 97.2 F (36.2 C)    Resp: 20  Weight: 144 lb (65.318 kg)  SpO2: 97%   Wt Readings from Last 3 Encounters:  07/11/15 144 lb (65.318 kg)  05/02/15 143 lb 12.8 oz (65.227 kg)  03/28/15 144 lb (65.318 kg)    Physical Exam  Constitutional: She is oriented to person, place, and time. No distress.  Neck: No JVD present. No tracheal deviation present. No thyromegaly present.  Cardiovascular: Normal rate, regular rhythm and normal heart sounds.   No murmur heard. Pulmonary/Chest: Effort normal and breath sounds normal. No respiratory distress. She has no wheezes.  Hr rhonchi in the right upper lobe that cleared with a cough  Abdominal: Soft. Bowel sounds are normal. She exhibits no distension. There is no tenderness.  Lymphadenopathy:    She has no cervical adenopathy.  Neurological: She is alert and oriented to person, place, and time.  Left sided weakness with left facial droop  Skin: Skin is warm and dry. She is not diaphoretic.  Psychiatric:  depressed     Labs reviewed/Significant Diagnostic Results:  02/01/14 2D echo Study Conclusions  - Left ventricle: The cavity size was normal. There was moderate concentric hypertrophy. Systolic function was normal. The estimated ejection fraction was in the range of 55% to 60%. Wall motion was normal; there were no regional wall motion abnormalities. Doppler parameters are consistent with abnormal left ventricular relaxation (grade 1 diastolic dysfunction). Doppler parameters are consistent with elevated ventricular end-diastolic filling pressure.  Basic Metabolic Panel:  Recent Labs  08/22/14 02/23/15 02/23/15 1419  NA 142 139 139  K 4.5 4.8 5.0  CL  --   --  106  CO2  --   --  25  GLUCOSE  --   --  85  BUN 26* 19 23  CREATININE 0.8 0.7 0.80  CALCIUM  --   --  8.6   Liver Function Tests:  Recent Labs  02/23/15 02/23/15 1419  AST 15 25  ALT 10 14  ALKPHOS 81 85  BILITOT  --  0.4  PROT  --  6.9  ALBUMIN  --  3.7   No  results for input(s): LIPASE, AMYLASE in the last 8760 hours. No results for input(s): AMMONIA in the last 8760 hours. CBC:  Recent Labs  02/23/15 02/23/15 1419 02/23/15 1604 02/23/15 1806 02/27/15  WBC 9.4 4.5  --  9.0  --   HGB 6.4* 14.7 7.3* 7.4* 7.7*  HCT 20* 46.8* 23.1* 23.2* 25*  MCV  --  88.3  --  87.5  --   PLT 393 232  --  439*  --    CBG: No results for input(s): GLUCAP in the last 8760 hours. TSH:  Recent Labs  08/22/14 09/16/14 02/23/15  TSH 0.30* 0.40* 0.67  A1C: Lab Results  Component Value Date   HGBA1C 6.1* 02/01/2014   Lipid Panel:  Recent Labs  08/22/14 02/23/15  CHOL 110 145  HDL 48 45  LDLCALC 47 82  TRIG 77 141   09/19/14: TSH 0.443, free T4 1.06, free T3 2.8 02/23/15: Iron 10, Ferritin 14   Assessment/Plan  1. Anemia due to chronic blood loss -stable with no reports of dark or bloody stools -continue ferrous sulfate and nexium and recheck CBC   2. Dysphagia S/P CVA (cerebrovascular accident) -some coughing with thin liquids but no recent reports of choking or dx of pneumonia -pt has signed a waiver for thin liquids -continue to monitor and provide aspiration precautions  3. Constipation, unspecified constipation type -stable, with out meds  4. Essential hypertension -controlled with cozaar and toprol -check BMP  5. CVA (cerebral vascular accident) -stable with no new events -not on asa or plavix due to recent anemia and GI Bleed (no work up due to age)  80. Coronary artery disease due to lipid rich plaque -stable with no reports of angina   7. Depression -appears to be depressed and was on Prozac at one time, but declines intervention at this point -no reports of SI   8. Rhinorrhea -declined atrovent spray   Cindi Carbon, ANP [facility] [phone]

## 2015-07-12 DIAGNOSIS — N183 Chronic kidney disease, stage 3 (moderate): Secondary | ICD-10-CM | POA: Diagnosis not present

## 2015-07-12 DIAGNOSIS — D649 Anemia, unspecified: Secondary | ICD-10-CM | POA: Diagnosis not present

## 2015-07-12 LAB — CBC AND DIFFERENTIAL
HCT: 25 % — AB (ref 36–46)
Hemoglobin: 8.1 g/dL — AB (ref 12.0–16.0)
Platelets: 393 10*3/uL (ref 150–399)
WBC: 8.2 10^3/mL

## 2015-07-12 LAB — BASIC METABOLIC PANEL
BUN: 23 mg/dL — AB (ref 4–21)
Creatinine: 0.8 mg/dL (ref 0.5–1.1)
GLUCOSE: 95 mg/dL
Potassium: 4.6 mmol/L (ref 3.4–5.3)
Sodium: 140 mmol/L (ref 137–147)

## 2015-08-30 DIAGNOSIS — Z961 Presence of intraocular lens: Secondary | ICD-10-CM | POA: Diagnosis not present

## 2015-08-30 DIAGNOSIS — H3532 Exudative age-related macular degeneration: Secondary | ICD-10-CM | POA: Diagnosis not present

## 2015-08-30 DIAGNOSIS — H18422 Band keratopathy, left eye: Secondary | ICD-10-CM | POA: Diagnosis not present

## 2015-09-04 ENCOUNTER — Ambulatory Visit (INDEPENDENT_AMBULATORY_CARE_PROVIDER_SITE_OTHER): Payer: Medicare Other | Admitting: Cardiology

## 2015-09-04 ENCOUNTER — Encounter: Payer: Self-pay | Admitting: Cardiology

## 2015-09-04 VITALS — BP 115/52 | HR 76

## 2015-09-04 DIAGNOSIS — I10 Essential (primary) hypertension: Secondary | ICD-10-CM

## 2015-09-04 DIAGNOSIS — I2583 Coronary atherosclerosis due to lipid rich plaque: Principal | ICD-10-CM

## 2015-09-04 DIAGNOSIS — I2510 Atherosclerotic heart disease of native coronary artery without angina pectoris: Secondary | ICD-10-CM | POA: Diagnosis not present

## 2015-09-04 DIAGNOSIS — I639 Cerebral infarction, unspecified: Secondary | ICD-10-CM

## 2015-09-04 NOTE — Progress Notes (Signed)
[place]. [address] [place], [place]  [address] Phone: [phone] Fax:  [phone]  Date:  09/04/2015   ID:  Sonya [name], DOB 17-Apr-1913, MRN [id number]  PCP:  Hollace Kinnier, DO   History of Present Illness: Sonya [name] is a 81 y.o. female with coronary artery disease status post ST elevation myocardial infarction receiving an RCA stent with residual moderate LAD disease and obtuse marginal disease here for followup.  CAD-stable with no exertional anginal symptoms. She will occasionally have fleeting chest discomfort, this is usually relieved by burping. In the past her chest discomfort post catheterization/stent placement was relieved with Nexium. I will stop isosorbide. She can call if symptoms arise.   Stroke-unfortunately suffered a stroke in early 2015. Dr. Leonie Man. Dysarthria has improved slightly. Dysphagia has improved. Left-sided facial droop has improved. Aspirin  Hyperlipidemia-in the past, she has not tolerated statins due to leg weakness.  GERD-doing well with Nexium. Seen Dr. Lamonte Sakai for chronic aspiration.    Wt Readings from Last 3 Encounters:  07/11/15 144 lb (65.318 kg)  05/02/15 143 lb 12.8 oz (65.227 kg)  03/28/15 144 lb (65.318 kg)     Past Medical History  Diagnosis Date  . Acute bronchitis   . Asthma   . TIA (transient ischemic attack)   . Herpes zoster complication 3818    complicated, left eye involvement  . Nontoxic uninodular goiter     negative [place] 2008  . Macular degeneration (senile) of retina, unspecified 2012    wet  . Acute myocardial infarction, unspecified site, episode of care unspecified 03/07/11  . Coronary atherosclerosis of native coronary artery      s/p stent diagonal branch 1996; RCA 03/07/11  . Chronic diastolic heart failure   . Cardiomegaly   . Diaphragmatic hernia without mention of obstruction or gangrene   . Irritable bowel syndrome   . Other specified genital prolapse(618.89)     corrected with  pessary  . Edema 2006  . Other abnormal blood chemistry   . Debility, unspecified   . Open wound of knee, leg (except thigh), and ankle, without mention of complication 29/9371    left leg, complicated wound, extended healing, resolved 01/2013  . Transient ischemic attack (TIA), and cerebral infarction without residual deficits(V12.54) 2008  . Shoulder pain 03/30/2013  . Hypertension   . Depression   . Finger numbness 12/28/2013    Left thumb, 1, 2nd fingers  . Dysphagia S/P CVA (cerebrovascular accident) 02/14/2014  . CHF (congestive heart failure)   . Stroke     Past Surgical History  Procedure Laterality Date  . Appendectomy    . Tonsillectomy and adenoidectomy    . Cataract extraction    . Cardiac catheterization  1996, 2001, 2012  . Carotid stent      diagonal branch of LCA    Current Outpatient Prescriptions  Medication Sig Dispense Refill  . antiseptic oral rinse (BIOTENE) LIQD 15 mLs by Mouth Rinse route at bedtime.    Marland Kitchen esomeprazole (NEXIUM) 40 MG capsule Take 40 mg by mouth daily.     . ferrous sulfate 325 (65 FE) MG tablet Take 325 mg by mouth daily with breakfast.    . Fluocinolone Acetonide (DERMA-SMOOTHE/FS SCALP) 0.01 % OIL Apply 1 application topically 2 (two) times daily as needed (for scalp).    . furosemide (LASIX) 20 MG tablet Take 20 mg by mouth every other day.     . losartan (COZAAR)  50 MG tablet Take 50 mg by mouth daily.    . Loteprednol Etabonate (LOTEMAX) 0.5 % GEL Apply 0.5 inches to eye 2 (two) times daily. Left eye    . metoprolol succinate (TOPROL-XL) 25 MG 24 hr tablet Take 25 mg by mouth daily.    . Multiple Vitamins-Minerals (ICAPS PO) Take 1 tablet by mouth daily.    . nitroGLYCERIN (NITROSTAT) 0.4 MG SL tablet Place 0.4 mg under the tongue every 5 (five) minutes as needed for chest pain.     . simvastatin (ZOCOR) 20 MG tablet Take 1 tablet (20 mg total) by mouth every other day.    . triamcinolone (KENALOG) 0.025 % cream Apply 1 application  topically 2 (two) times daily as needed (for itchy areas).     . LOTEMAX 0.5 % ophthalmic suspension   0   No current facility-administered medications for this visit.    Allergies:    Allergies  Allergen Reactions  . Crestor [Rosuvastatin] Other (See Comments)    Leg Weakness   . Ace Inhibitors Cough  . Plavix [Clopidogrel] Nausea And Vomiting    Social History:  The patient  reports that she quit smoking about 51 years ago. She has never used smokeless tobacco. She reports that she does not drink alcohol or use illicit drugs.   ROS:  Please see the history of present illness.   Denies any fevers, chills. Recent leg wound. Occasional rare chest pain.   PHYSICAL EXAM: VS:  BP 115/52 mmHg  Pulse 76  Wt  Well nourished, well developed, in no acute distress elderly,in motorized wheelchair. HEENT: normal Neck: no JVD Cardiac:  normal S1, S2; RRR; no murmur Lungs:  clear to auscultation bilaterally, no wheezing, rhonchi or rales Abd: soft, nontender, no hepatomegaly Ext: no edema Skin: Right leg improved no edema. Neuro: Very minimal left facial droop. Dysarthria noted.  EKG:  05/01/15-sinus rhythm, 73, old inferior infarct pattern, nonspecific T-wave changes-prior Normal rhythm 63, T wave inversion inferior leads mostly but scattered throughout EKG.     ASSESSMENT AND PLAN:  1. Stroke-01/2014- symptom was trouble speaking, dysarthria. Dysphagia and facial droop have improved. Aspirin has been stopped because of anemia incident where hemoglobin was 7.4. She received transfusion.. Seen Dr. Lamonte Sakai for aspiration pneumonia. Thickened liquids. Stopped Advair and this seemed to help as well. 2. Coronary artery disease status post MI-overall doing well. Aspirin stopped because of severe anemia. On simvastatin. Lipids have been excellent.  3. Hypertension-improved. Stable, doing well.  4. Chronic diastolic heart failure-doing well, stable. No shortness of breath. 5. See her back in 6  months  Signed, Candee Furbish, MD [facility]  09/04/2015 3:10 PM

## 2015-09-04 NOTE — Patient Instructions (Signed)
Medication Instructions:  The current medical regimen is effective;  continue present plan and medications.  Follow-Up: Follow up in 6 months with Dr. Skains.  You will receive a letter in the mail 2 months before you are due.  Please call us when you receive this letter to schedule your follow up appointment.  Thank you for choosing [facility]!!     

## 2015-09-06 ENCOUNTER — Non-Acute Institutional Stay (SKILLED_NURSING_FACILITY): Payer: Medicare Other | Admitting: Adult Health

## 2015-09-06 DIAGNOSIS — I639 Cerebral infarction, unspecified: Secondary | ICD-10-CM | POA: Diagnosis not present

## 2015-09-06 DIAGNOSIS — K449 Diaphragmatic hernia without obstruction or gangrene: Secondary | ICD-10-CM | POA: Diagnosis not present

## 2015-09-06 DIAGNOSIS — D500 Iron deficiency anemia secondary to blood loss (chronic): Secondary | ICD-10-CM | POA: Diagnosis not present

## 2015-09-06 DIAGNOSIS — I2510 Atherosclerotic heart disease of native coronary artery without angina pectoris: Secondary | ICD-10-CM | POA: Diagnosis not present

## 2015-09-06 DIAGNOSIS — I5032 Chronic diastolic (congestive) heart failure: Secondary | ICD-10-CM | POA: Diagnosis not present

## 2015-09-06 DIAGNOSIS — I2583 Coronary atherosclerosis due to lipid rich plaque: Secondary | ICD-10-CM

## 2015-09-06 DIAGNOSIS — F329 Major depressive disorder, single episode, unspecified: Secondary | ICD-10-CM

## 2015-09-06 DIAGNOSIS — F32A Depression, unspecified: Secondary | ICD-10-CM

## 2015-09-06 DIAGNOSIS — I10 Essential (primary) hypertension: Secondary | ICD-10-CM | POA: Diagnosis not present

## 2015-09-07 ENCOUNTER — Telehealth: Payer: Self-pay | Admitting: Cardiology

## 2015-09-07 NOTE — Telephone Encounter (Signed)
In review of the chart ASA was stopped Dr Irine Seal 02/26/2015 d/t blood loss anemia. Have attempted several times to contact to let them know however the line has been busy X 4.

## 2015-09-07 NOTE — Telephone Encounter (Signed)
New message    Pt lives at [facility] and they are not giving her the aspirin Pt is wanting to know if Dr Marlou Porch took her off aspirin Please call to discuss

## 2015-09-10 ENCOUNTER — Encounter: Payer: Self-pay | Admitting: Adult Health
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
## 2015-09-10 NOTE — Telephone Encounter (Signed)
Spoke with pt who is concerned about not being on ASA.  Advised pt the ASA was stopped in 02/2015 while she was in the hospital d/t blood loss anemia.  She reports she knows nothing of this DX and that she didn't have a blood transfusion because she doesn't have any marks on her arms.  Advised the transfusion was 6 months ago and she wouldn't have marks on her arms.  I asked who I could speak with at [facility] and she told me Tammy or Ebony Hail.  Spoke with Tammy who is aware of the conversation.  She will speak with the patient.  Tammy reports they have discussed this with the patient several times but it seems she is having trouble with her memory.

## 2015-09-10 NOTE — Telephone Encounter (Signed)
F/u ° ° °Pt returning call from nurse. °

## 2015-09-20 DIAGNOSIS — N183 Chronic kidney disease, stage 3 (moderate): Secondary | ICD-10-CM | POA: Diagnosis not present

## 2015-09-20 DIAGNOSIS — D649 Anemia, unspecified: Secondary | ICD-10-CM | POA: Diagnosis not present

## 2015-09-20 LAB — CBC AND DIFFERENTIAL
HEMATOCRIT: 26 % — AB (ref 36–46)
Hemoglobin: 8.4 g/dL — AB (ref 12.0–16.0)
Platelets: 448 10*3/uL — AB (ref 150–399)
WBC: 10.3 10^3/mL

## 2015-09-25 ENCOUNTER — Telehealth: Payer: Self-pay | Admitting: Emergency Medicine

## 2015-09-25 NOTE — Telephone Encounter (Signed)
Called spoke w/ pt. Made aware [facility] ahs no openings. Advised TP could see her sooner. She scheduled appt for 10/13 at 3:15 w/ TP. Nothing further needed

## 2015-09-26 DIAGNOSIS — Z23 Encounter for immunization: Secondary | ICD-10-CM | POA: Diagnosis not present

## 2015-09-27 ENCOUNTER — Encounter: Payer: Self-pay | Admitting: Adult Health

## 2015-09-27 ENCOUNTER — Non-Acute Institutional Stay (SKILLED_NURSING_FACILITY): Payer: Medicare Other | Admitting: Adult Health

## 2015-09-27 DIAGNOSIS — I69391 Dysphagia following cerebral infarction: Secondary | ICD-10-CM | POA: Diagnosis not present

## 2015-09-27 DIAGNOSIS — R05 Cough: Secondary | ICD-10-CM

## 2015-09-27 DIAGNOSIS — R059 Cough, unspecified: Secondary | ICD-10-CM

## 2015-09-27 NOTE — Progress Notes (Signed)
Patient ID: Sonya [name], female   DOB: Mar 08, 1913, 81 y.o.   MRN: [id number]     Nursing Home Location:  [facility]   Code Status: DNR  Patient Care Team: Gayland Curry, DO as PCP - General (Geriatric Medicine) [facility] Jerline Pain, MD as Consulting Physician (Cardiology) [name], NP as Nurse Practitioner (Obstetrics and Gynecology)   Place of Service: SNF (31)  Chief Complaint  Patient presents with  . Acute Visit    cough    HPI:  81 y.o. female residing at [facility], skilled section. I was asked to see her for a cough present for 4-5 days per resident. Originally had yellow sputum production but this has subsided. Low grade fever for one reading today other wise afebrile. No SOB, DOE, CP. Reports congested nasal passages but denies malaise, decreased appetite, chills, ear pain.  She has a hx of dysphagia due to CVA.  No recent bouts of pna but has signed a waiver for thin liquids  Review of Systems:  Review of Systems  Constitutional: Negative for fever, chills, activity change, appetite change and fatigue.  HENT: Positive for congestion, postnasal drip, rhinorrhea and trouble swallowing. Negative for ear pain, facial swelling, sinus pressure and sneezing.   Eyes: Negative for discharge and itching.  Respiratory: Positive for cough. Negative for shortness of breath, wheezing and stridor.   Cardiovascular: Negative for chest pain and leg swelling.  Gastrointestinal: Negative for nausea, diarrhea, constipation and abdominal distention.  Skin: Negative for pallor, rash and wound.  Neurological: Positive for speech difficulty (h/o CVA) and weakness.  Psychiatric/Behavioral: Positive for behavioral problems and confusion.    Medications: Patient's Medications  New Prescriptions   No medications on file  Previous Medications   ANTISEPTIC ORAL RINSE (BIOTENE) LIQD    15 mLs by Mouth Rinse route at  bedtime.   ESOMEPRAZOLE (NEXIUM) 40 MG CAPSULE    Take 40 mg by mouth daily.    FERROUS SULFATE 325 (65 FE) MG TABLET    Take 325 mg by mouth daily with breakfast.   FLUOCINOLONE ACETONIDE (DERMA-SMOOTHE/FS SCALP) 0.01 % OIL    Apply 1 application topically 2 (two) times daily as needed (for scalp).   FUROSEMIDE (LASIX) 20 MG TABLET    Take 20 mg by mouth every other day.    LOSARTAN (COZAAR) 50 MG TABLET    Take 50 mg by mouth daily.   LOTEMAX 0.5 % OPHTHALMIC SUSPENSION       LOTEPREDNOL ETABONATE (LOTEMAX) 0.5 % GEL    Apply 0.5 inches to eye 2 (two) times daily. Left eye   METOPROLOL SUCCINATE (TOPROL-XL) 25 MG 24 HR TABLET    Take 25 mg by mouth daily.   MULTIPLE VITAMINS-MINERALS (ICAPS PO)    Take 1 tablet by mouth daily.   NITROGLYCERIN (NITROSTAT) 0.4 MG SL TABLET    Place 0.4 mg under the tongue every 5 (five) minutes as needed for chest pain.    SIMVASTATIN (ZOCOR) 20 MG TABLET    Take 1 tablet (20 mg total) by mouth every other day.   TRIAMCINOLONE (KENALOG) 0.025 % CREAM    Apply 1 application topically 2 (two) times daily as needed (for itchy areas).   Modified Medications   No medications on file  Discontinued Medications   No medications on file     Physical Exam:  Filed Vitals:   09/27/15 1132  BP: 142/81  Pulse: 94  Temp: 97.9 F (36.6  C)  Resp: 20  SpO2: 97%    Physical Exam  Constitutional: She is oriented to person, place, and time. No distress.  HENT:  Head: Normocephalic and atraumatic.  Mouth/Throat: Oropharynx is clear and moist. No oropharyngeal exudate.  Clear nasal drainage bilat with erythema to turbinates  Cardiovascular: Normal rate and regular rhythm.   No murmur heard. Pulmonary/Chest: Effort normal. She has no wheezes. She has no rales.  Clear on left, rhonchi on the right upper and mid lobes  Abdominal: Soft. Bowel sounds are normal. She exhibits no distension.  Neurological: She is alert and oriented to person, place, and time.  Skin:  Skin is warm and dry. She is not diaphoretic.  Psychiatric:  Easily angered    Wt Readings from Last 3 Encounters:  09/10/15 144 lb 12.8 oz (65.681 kg)  07/11/15 144 lb (65.318 kg)  05/02/15 143 lb 12.8 oz (65.227 kg)     Labs reviewed/Significant Diagnostic Results:  Basic Metabolic Panel:  Recent Labs  02/23/15 02/23/15 1419 07/12/15  NA 139 139 140  K 4.8 5.0 4.6  CL  --  106  --   CO2  --  25  --   GLUCOSE  --  85  --   BUN 19 23 23*  CREATININE 0.7 0.80 0.8  CALCIUM  --  8.6  --    Liver Function Tests:  Recent Labs  02/23/15 02/23/15 1419  AST 15 25  ALT 10 14  ALKPHOS 81 85  BILITOT  --  0.4  PROT  --  6.9  ALBUMIN  --  3.7   No results for input(s): LIPASE, AMYLASE in the last 8760 hours. No results for input(s): AMMONIA in the last 8760 hours. CBC:  Recent Labs  02/23/15 1419  02/23/15 1806 02/27/15 07/12/15  WBC 4.5  --  9.0  --  8.2  HGB 14.7  < > 7.4* 7.7* 8.1*  HCT 46.8*  < > 23.2* 25* 25*  MCV 88.3  --  87.5  --   --   PLT 232  --  439*  --  393  < > = values in this interval not displayed. CBG: No results for input(s): GLUCAP in the last 8760 hours. TSH:  Recent Labs  02/23/15  TSH 0.67   A1C: Lab Results  Component Value Date   HGBA1C 6.1* 02/01/2014   Lipid Panel:  Recent Labs  02/23/15  CHOL 145  HDL 45  LDLCALC 82  TRIG 141       Assessment/Plan  1. Cough -may use cough syrup per facility protocol -encourage fluid -continue PPI -check PCXR due to rhonchi noted in the right lung and r/f asp pna -no antibiotics at this time as she does not have a fever, decreased 02 sat, sputum production, and her cough has only been present for 4-5 days by her account   2. Dysphagia S/P CVA (cerebrovascular accident) -has worked with [facility] and signed waiver to have thin liquids -continues on a regular diet with food cut into bite size pieces, remains at risk for asp pna   Labs/tests ordered PA and LAT CXR   Cindi Carbon,  [place] [phone]

## 2015-10-03 DIAGNOSIS — H18422 Band keratopathy, left eye: Secondary | ICD-10-CM | POA: Diagnosis not present

## 2015-10-04 ENCOUNTER — Ambulatory Visit: Payer: BLUE CROSS/BLUE SHIELD | Admitting: Adult Health

## 2015-10-08 ENCOUNTER — Encounter: Payer: Self-pay | Admitting: Adult Health

## 2015-10-08 ENCOUNTER — Non-Acute Institutional Stay (SKILLED_NURSING_FACILITY): Payer: Medicare Other | Admitting: Adult Health

## 2015-10-08 DIAGNOSIS — D500 Iron deficiency anemia secondary to blood loss (chronic): Secondary | ICD-10-CM | POA: Diagnosis not present

## 2015-10-08 DIAGNOSIS — F32A Depression, unspecified: Secondary | ICD-10-CM

## 2015-10-08 DIAGNOSIS — G3184 Mild cognitive impairment, so stated: Secondary | ICD-10-CM | POA: Diagnosis not present

## 2015-10-08 DIAGNOSIS — F329 Major depressive disorder, single episode, unspecified: Secondary | ICD-10-CM
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
## 2015-10-12 DIAGNOSIS — R05 Cough: Secondary | ICD-10-CM | POA: Diagnosis not present

## 2015-10-29 ENCOUNTER — Encounter: Payer: Self-pay | Admitting: Adult Health

## 2015-10-29 ENCOUNTER — Non-Acute Institutional Stay (SKILLED_NURSING_FACILITY): Payer: Medicare Other | Admitting: Adult Health

## 2015-10-29 DIAGNOSIS — N8110 Cystocele, unspecified: Secondary | ICD-10-CM

## 2015-10-29 DIAGNOSIS — D500 Iron deficiency anemia secondary to blood loss (chronic): Secondary | ICD-10-CM | POA: Diagnosis not present

## 2015-10-29 DIAGNOSIS — K449 Diaphragmatic hernia without obstruction or gangrene: Secondary | ICD-10-CM

## 2015-10-29 DIAGNOSIS — I5032 Chronic diastolic (congestive) heart failure: Secondary | ICD-10-CM

## 2015-10-29 DIAGNOSIS — I10 Essential (primary) hypertension: Secondary | ICD-10-CM | POA: Diagnosis not present

## 2015-10-29 NOTE — Progress Notes (Signed)
Patient ID: Sonya [name], female   DOB: [birth year], 81 y.o.   MRN: [id number]     Nursing Home Location:  [facility]   Code Status: DNR   Place of Service: SNF (31)  Chief Complaint  Patient presents with  . Medical Management of Chronic Issues    HPI:  81 y.o.  Female residing at [facility], skilled care section.  She has a hx of CVA with left sided weakness, CAD, Mi s/p stent, HTN, asthma, CHF, dysphagia, anemia, and constipation. Her VS have been stable over the past month. Her weight has decreased by 5 lbs to 140 lbs in the past month.  She denies any CP, SOB, bloody stool, abd pain, or DOE.  I am here to review her chronic medical issues.  She just celebrated her 84 th birthday. She has no complaints today. She was treated for an acute bronchitis with Augmentin on 10/21, then changed to doxycycline on 10/24.  She no longer has a cough or sputum production.    Review of Systems:  Review of Systems  Constitutional: Negative for fever, chills, activity change and appetite change.  HENT: Positive for rhinorrhea and trouble swallowing. Negative for congestion and sore throat.   Respiratory: Negative for cough, chest tightness, shortness of breath and wheezing.   Cardiovascular: Positive for leg swelling. Negative for chest pain and palpitations.  Gastrointestinal: Negative for abdominal pain, constipation, blood in stool, abdominal distention and anal bleeding.  Genitourinary:       Incontinent  Musculoskeletal: Positive for gait problem.       Uses wc and scooter  Skin: Negative for rash and wound.  Neurological: Positive for facial asymmetry. Negative for dizziness, tremors, seizures, speech difficulty and weakness.  Psychiatric/Behavioral: Negative for hallucinations, behavioral problems, sleep disturbance, self-injury, dysphoric mood and agitation.       Memory loss   Medications: Patient's Medications  New Prescriptions   No medications on file  Previous Medications   ANTISEPTIC ORAL RINSE (BIOTENE) LIQD    15 mLs by Mouth Rinse route at bedtime.   ESOMEPRAZOLE (NEXIUM) 40 MG CAPSULE    Take 40 mg by mouth daily.    FERROUS SULFATE 325 (65 FE) MG TABLET    Take 325 mg by mouth daily with breakfast.   FLUOCINOLONE ACETONIDE (DERMA-SMOOTHE/FS SCALP) 0.01 % OIL    Apply 1 application topically 2 (two) times daily as needed (for scalp).   FUROSEMIDE (LASIX) 20 MG TABLET    Take 20 mg by mouth every other day.    LOSARTAN (COZAAR) 50 MG TABLET    Take 50 mg by mouth daily.   LOTEMAX 0.5 % OPHTHALMIC SUSPENSION       LOTEPREDNOL ETABONATE (LOTEMAX) 0.5 % GEL    Apply 0.5 inches to eye 2 (two) times daily. Left eye   METOPROLOL SUCCINATE (TOPROL-XL) 25 MG 24 HR TABLET    Take 25 mg by mouth daily.   MULTIPLE VITAMINS-MINERALS (ICAPS PO)    Take 1 tablet by mouth daily.   NITROGLYCERIN (NITROSTAT) 0.4 MG SL TABLET    Place 0.4 mg under the tongue every 5 (five) minutes as needed for chest pain.    SERTRALINE (ZOLOFT) 50 MG TABLET    Take 50 mg by mouth daily.   SIMVASTATIN (ZOCOR) 20 MG TABLET    Take 1 tablet (20 mg total) by mouth every other day.   TRIAMCINOLONE (KENALOG) 0.025 % CREAM    Apply 1 application topically 2 (two)  times daily as needed (for itchy areas).   Modified Medications   No medications on file  Discontinued Medications   No medications on file     Physical Exam:  Filed Vitals:   10/29/15 1446  Weight: 140 lb (63.504 kg)   Wt Readings from Last 3 Encounters:  10/29/15 140 lb (63.504 kg)  10/08/15 145 lb (65.772 kg)  09/10/15 144 lb 12.8 oz (65.681 kg)    Physical Exam  Constitutional: She is oriented to person, place, and time. No distress.  Neck: Normal range of motion. Neck supple.  Cardiovascular: Normal rate, regular rhythm and normal heart sounds.   No murmur heard. Pulmonary/Chest: Effort normal and breath sounds normal. No respiratory distress. She has no wheezes.    Abdominal: Soft. Bowel sounds are normal. She exhibits no distension. There is no tenderness.  Neurological: She is alert and oriented to person, place, and time.  Left sided weakness with left facial droop  Skin: Skin is warm and dry. She is not diaphoretic.  Psychiatric: Affect normal.     Labs reviewed/Significant Diagnostic Results:  02/01/14 2D echo Study Conclusions  - Left ventricle: The cavity size was normal. There was moderate concentric hypertrophy. Systolic function was normal. The estimated ejection fraction was in the range of 55% to 60%. Wall motion was normal; there were no regional wall motion abnormalities. Doppler parameters are consistent with abnormal left ventricular relaxation (grade 1 diastolic dysfunction). Doppler parameters are consistent with elevated ventricular end-diastolic filling pressure.  09/27/15 PCXR -central pulmonary venous congestion without frank edema, chronic chest findings present  Basic Metabolic Panel:  Recent Labs  02/23/15 02/23/15 1419 07/12/15  NA 139 139 140  K 4.8 5.0 4.6  CL  --  106  --   CO2  --  25  --   GLUCOSE  --  85  --   BUN 19 23 23*  CREATININE 0.7 0.80 0.8  CALCIUM  --  8.6  --    Liver Function Tests:  Recent Labs  02/23/15 02/23/15 1419  AST 15 25  ALT 10 14  ALKPHOS 81 85  BILITOT  --  0.4  PROT  --  6.9  ALBUMIN  --  3.7   No results for input(s): LIPASE, AMYLASE in the last 8760 hours. No results for input(s): AMMONIA in the last 8760 hours. CBC:  Recent Labs  02/23/15 1419  02/23/15 1806 02/27/15 07/12/15 09/20/15  WBC 4.5  --  9.0  --  8.2 10.3  HGB 14.7  < > 7.4* 7.7* 8.1* 8.4*  HCT 46.8*  < > 23.2* 25* 25* 26*  MCV 88.3  --  87.5  --   --   --   PLT 232  --  439*  --  393 448*  < > = values in this interval not displayed. CBG: No results for input(s): GLUCAP in the last 8760 hours. TSH:  Recent Labs  02/23/15  TSH 0.67   A1C: Lab Results  Component Value  Date   HGBA1C 6.1* 02/01/2014   Lipid Panel:  Recent Labs  02/23/15  CHOL 145  HDL 45  LDLCALC 82  TRIG 141   09/19/14: TSH 0.443, free T4 1.06, free T3 2.8, ferritin 23, total iron 17, TIBC 312, %sat 5 02/23/15: Iron 10, Ferritin 14   Assessment/Plan  1. Essential hypertension -controlled with current regimen -goal BP liberal due to age   2. Chronic diastolic heart failure (HCC) -stable on lasix qod -monitor weight, no  other changes  3. Anemia due to chronic blood loss -Hgb stable but low at 8.4, normocytic with low iron, low %sat, and low normal ferritin -continue ferrous sulfate, check CBC q 3 months -no further work up due to age  107. Hiatal hernia -remains on nexium and is symptom free  5. Female bladder prolapse -records indicate she used to have a pessary changed q 8 weeks -this has not been done in the past year -resident was instructed to call her gyn for further guidance     Cindi Carbon, [place] [phone]

## 2015-10-30 ENCOUNTER — Non-Acute Institutional Stay (SKILLED_NURSING_FACILITY): Payer: Medicare Other | Admitting: Internal Medicine

## 2015-10-30 ENCOUNTER — Encounter: Payer: Self-pay | Admitting: Internal Medicine

## 2015-10-30 DIAGNOSIS — N8110 Cystocele, unspecified: Secondary | ICD-10-CM | POA: Diagnosis not present

## 2015-10-30 DIAGNOSIS — Z9289 Personal history of other medical treatment: Secondary | ICD-10-CM | POA: Diagnosis not present

## 2015-10-30 DIAGNOSIS — Z960 Presence of urogenital implants: Secondary | ICD-10-CM

## 2015-11-01 ENCOUNTER — Encounter: Payer: Self-pay | Admitting: Adult Health

## 2015-11-01 ENCOUNTER — Non-Acute Institutional Stay (SKILLED_NURSING_FACILITY): Payer: Medicare Other | Admitting: Adult Health

## 2015-11-01 DIAGNOSIS — M7989 Other specified soft tissue disorders: Secondary | ICD-10-CM | POA: Diagnosis not present

## 2015-11-01 DIAGNOSIS — M79661 Pain in right lower leg: Secondary | ICD-10-CM

## 2015-11-01 DIAGNOSIS — M79604 Pain in right leg: Secondary | ICD-10-CM | POA: Diagnosis not present

## 2015-11-01 NOTE — Progress Notes (Signed)
Patient ID: Sonya [name], female   DOB: Nov 04, 1913, 81 y.o.   MRN: [id number]     Nursing Home Location:  [facility]   Code Status: DNR   Place of Service: SNF (31)  Chief Complaint  Patient presents with  . Acute Visit    right leg pain and swelling    HPI:  81 y.o.  Female residing at [facility], skilled care section.  She has a hx of CVA with left sided weakness, CAD, Mi s/p stent, HTN, asthma, CHF, dysphagia, anemia, and constipation.  I was asked to see her today due to right leg swelling, redness,  and tenderness.  She is not sure when the symptoms first started because of her poor vision. She does not have any hx of DVT/PE.  She was taken off aspirin earlier this year due to heme pos stool and anemia which required blood transfusion.    Review of Systems:  Review of Systems  Constitutional: Negative for fever, chills, activity change and appetite change.  HENT: Positive for rhinorrhea and trouble swallowing. Negative for congestion and sore throat.   Respiratory: Negative for cough, chest tightness, shortness of breath and wheezing.   Cardiovascular: Positive for leg swelling (worse on right). Negative for chest pain and palpitations.  Gastrointestinal: Negative for abdominal pain, constipation, blood in stool, abdominal distention and anal bleeding.  Genitourinary:       Incontinent  Musculoskeletal: Positive for gait problem.       Uses wc and scooter  Skin: Positive for color change (red right leg). Negative for rash and wound.  Neurological: Positive for facial asymmetry and speech difficulty. Negative for dizziness, tremors, seizures and weakness.  Psychiatric/Behavioral:       Memory loss   Medications: Patient's Medications  New Prescriptions   No medications on file  Previous Medications   ANTISEPTIC ORAL RINSE (BIOTENE) LIQD    15 mLs by Mouth Rinse route at bedtime.   ESOMEPRAZOLE (NEXIUM) 40 MG CAPSULE     Take 40 mg by mouth daily.    FERROUS SULFATE 325 (65 FE) MG TABLET    Take 325 mg by mouth daily with breakfast.   FLUOCINOLONE ACETONIDE (DERMA-SMOOTHE/FS SCALP) 0.01 % OIL    Apply 1 application topically 2 (two) times daily as needed (for scalp).   FUROSEMIDE (LASIX) 20 MG TABLET    Take 20 mg by mouth every other day.    LOSARTAN (COZAAR) 50 MG TABLET    Take 50 mg by mouth daily.   LOTEMAX 0.5 % OPHTHALMIC SUSPENSION       LOTEPREDNOL ETABONATE (LOTEMAX) 0.5 % GEL    Apply 0.5 inches to eye 2 (two) times daily. Left eye   METOPROLOL SUCCINATE (TOPROL-XL) 25 MG 24 HR TABLET    Take 25 mg by mouth daily.   MULTIPLE VITAMINS-MINERALS (ICAPS PO)    Take 1 tablet by mouth daily.   NITROGLYCERIN (NITROSTAT) 0.4 MG SL TABLET    Place 0.4 mg under the tongue every 5 (five) minutes as needed for chest pain.    SERTRALINE (ZOLOFT) 50 MG TABLET    Take 50 mg by mouth daily.   SIMVASTATIN (ZOCOR) 20 MG TABLET    Take 1 tablet (20 mg total) by mouth every other day.   TRIAMCINOLONE (KENALOG) 0.025 % CREAM    Apply 1 application topically 2 (two) times daily as needed (for itchy areas).   Modified Medications   No medications on file  Discontinued Medications  No medications on file     Physical Exam:  There were no vitals filed for this visit. Wt Readings from Last 3 Encounters:  10/30/15 140 lb (63.504 kg)  10/29/15 140 lb (63.504 kg)  10/08/15 145 lb (65.772 kg)    Physical Exam  Constitutional: She is oriented to person, place, and time. No distress.  Neck: Normal range of motion. Neck supple.  Cardiovascular: Normal rate, regular rhythm and normal heart sounds.   No murmur heard. Pulmonary/Chest: Effort normal and breath sounds normal. No respiratory distress. She has no wheezes.  Abdominal: Soft. Bowel sounds are normal. She exhibits no distension. There is no tenderness.  Neurological: She is alert and oriented to person, place, and time.  Left sided weakness with left facial  droop  Skin: Skin is warm and dry. She is not diaphoretic.  Right lower ext with redness, swelling, and tenderness on palpation. Palpable pedal pulse +1 on the right, normal sensation.   Psychiatric: Affect normal.     Labs reviewed/Significant Diagnostic Results:  02/01/14 2D echo Study Conclusions  - Left ventricle: The cavity size was normal. There was moderate concentric hypertrophy. Systolic function was normal. The estimated ejection fraction was in the range of 55% to 60%. Wall motion was normal; there were no regional wall motion abnormalities. Doppler parameters are consistent with abnormal left ventricular relaxation (grade 1 diastolic dysfunction). Doppler parameters are consistent with elevated ventricular end-diastolic filling pressure.  09/27/15 PCXR -central pulmonary venous congestion without frank edema, chronic chest findings present  Basic Metabolic Panel:  Recent Labs  02/23/15 02/23/15 1419 07/12/15  NA 139 139 140  K 4.8 5.0 4.6  CL  --  106  --   CO2  --  25  --   GLUCOSE  --  85  --   BUN 19 23 23*  CREATININE 0.7 0.80 0.8  CALCIUM  --  8.6  --    Liver Function Tests:  Recent Labs  02/23/15 02/23/15 1419  AST 15 25  ALT 10 14  ALKPHOS 81 85  BILITOT  --  0.4  PROT  --  6.9  ALBUMIN  --  3.7   No results for input(s): LIPASE, AMYLASE in the last 8760 hours. No results for input(s): AMMONIA in the last 8760 hours. CBC:  Recent Labs  02/23/15 1419  02/23/15 1806 02/27/15 07/12/15 09/20/15  WBC 4.5  --  9.0  --  8.2 10.3  HGB 14.7  < > 7.4* 7.7* 8.1* 8.4*  HCT 46.8*  < > 23.2* 25* 25* 26*  MCV 88.3  --  87.5  --   --   --   PLT 232  --  439*  --  393 448*  < > = values in this interval not displayed. CBG: No results for input(s): GLUCAP in the last 8760 hours. TSH:  Recent Labs  02/23/15  TSH 0.67   A1C: Lab Results  Component Value Date   HGBA1C 6.1* 02/01/2014   Lipid Panel:  Recent Labs  02/23/15    CHOL 145  HDL 45  LDLCALC 82  TRIG 141   09/19/14: TSH 0.443, free T4 1.06, free T3 2.8, ferritin 23, total iron 17, TIBC 312, %sat 5 02/23/15: Iron 10, Ferritin 14   Assessment/Plan  1) Right leg swelling -?DVT vs cellulitis vs. Thrombophlebitis -check RLE doppler to r/o dvt and if neg will start levaquin -if she does have a DVT the decision to anticoagulate will be difficult due to her  chronic anemia and hx of heme pos stools -keep RLE elevated when possible  -resident is already non weight bearing due to debility and spends most the day in her scooter     Cindi Carbon, [place] [phone]

## 2015-11-02 DIAGNOSIS — R609 Edema, unspecified: Secondary | ICD-10-CM | POA: Diagnosis not present

## 2015-11-02 DIAGNOSIS — L539 Erythematous condition, unspecified: Secondary | ICD-10-CM | POA: Diagnosis not present

## 2015-11-03 NOTE — Progress Notes (Signed)
Patient ID: Sonya [name], female   DOB: January 19, 1913, 81 y.o.   MRN: [id number]  [facility] Provider:  Jonelle Sidle L. Tavonte Seybold, D.O., C.M.D. Hollace Kinnier, DO  Code Status:  DNR Goals of care: Advanced Directive information Does patient have an advance directive?: Yes, Type of Advance Directive: [place];Living will;Out of facility DNR (pink MOST or yellow form), Pre-existing out of facility DNR order (yellow form or pink MOST form): Yellow form placed in chart (order not valid for inpatient use)  Chief Complaint  Patient presents with  . Acute Visit    pessary    HPI:  Pt is a 81 y.o. white female seen today for an acute visit due to concerns about her pessary.  Apparently, she had been seeing [name] for annual pessary changes, and is now due for the procedure.  Staff do not think she'll be able to go due to her need for the stand-up lift for transfers.  Pt states she is ok to have it done here, but requests I speak with her gyn office about it.    Review of Systems  Constitutional: Negative for fever and chills.  Gastrointestinal: Negative for abdominal pain.  Genitourinary: Negative for dysuria, urgency, frequency, hematuria and flank pain.       No vaginal discharge or odor    Past Medical History  Diagnosis Date  . Acute bronchitis   . Asthma   . TIA (transient ischemic attack)   . Herpes zoster complication 123456    complicated, left eye involvement  . Nontoxic uninodular goiter     negative [place] 2008  . Macular degeneration (senile) of retina, unspecified 2012    wet  . Acute myocardial infarction, unspecified site, episode of care unspecified 03/07/11  . Coronary atherosclerosis of native coronary artery      s/p stent diagonal branch 1996; RCA 03/07/11  . Chronic diastolic heart failure ([place])   . Cardiomegaly   . Diaphragmatic hernia without mention of obstruction or gangrene   . Irritable bowel syndrome   . Other specified genital  prolapse(618.89)     corrected with pessary  . Edema 2006  . Other abnormal blood chemistry   . Debility, unspecified   . Open wound of knee, leg (except thigh), and ankle, without mention of complication 0000000    left leg, complicated wound, extended healing, resolved 01/2013  . Transient ischemic attack (TIA), and cerebral infarction without residual deficits 2008  . Shoulder pain 03/30/2013  . Hypertension   . Depression   . Finger numbness 12/28/2013    Left thumb, 1, 2nd fingers  . Dysphagia S/P CVA (cerebrovascular accident) 02/14/2014  . CHF (congestive heart failure) ([place])   . Stroke ([place])   . Chronic diastolic heart failure ([place]) 01/31/2014    01/2014: 2d echo: EF 0000000, grade 1 diastolic dysfunction    Past Surgical History  Procedure Laterality Date  . Appendectomy    . Tonsillectomy and adenoidectomy    . Cataract extraction    . Cardiac catheterization  1996, 2001, 2012  . Carotid stent      diagonal branch of LCA    Allergies  Allergen Reactions  . Crestor [Rosuvastatin] Other (See Comments)    Leg Weakness   . Ace Inhibitors Cough  . Plavix [Clopidogrel] Nausea And Vomiting      Medication List       This list is accurate as of: 10/30/15 11:59 PM.  Always use your most recent med list.  antiseptic oral rinse Liqd  15 mLs by Mouth Rinse route at bedtime.     DERMA-SMOOTHE/FS SCALP 0.01 % Oil  Apply 1 application topically 2 (two) times daily as needed (for scalp).     esomeprazole 40 MG capsule  Commonly known as:  NEXIUM  Take 40 mg by mouth daily.     ferrous sulfate 325 (65 FE) MG tablet  Take 325 mg by mouth daily with breakfast.     furosemide 20 MG tablet  Commonly known as:  LASIX  Take 20 mg by mouth every other day.     ICAPS PO  Take 1 tablet by mouth daily.     losartan 50 MG tablet  Commonly known as:  COZAAR  Take 50 mg by mouth daily.     LOTEMAX 0.5 % Gel  Generic drug:  Loteprednol Etabonate  Apply 0.5  inches to eye 2 (two) times daily. Left eye     LOTEMAX 0.5 % ophthalmic suspension  Generic drug:  loteprednol     metoprolol succinate 25 MG 24 hr tablet  Commonly known as:  TOPROL-XL  Take 25 mg by mouth daily.     nitroGLYCERIN 0.4 MG SL tablet  Commonly known as:  NITROSTAT  Place 0.4 mg under the tongue every 5 (five) minutes as needed for chest pain.     sertraline 50 MG tablet  Commonly known as:  ZOLOFT  Take 50 mg by mouth daily.     simvastatin 20 MG tablet  Commonly known as:  ZOCOR  Take 1 tablet (20 mg total) by mouth every other day.     triamcinolone 0.025 % cream  Commonly known as:  KENALOG  Apply 1 application topically 2 (two) times daily as needed (for itchy areas).        Immunization History  Administered Date(s) Administered  . Influenza Whole 09/26/2013  . Influenza-Unspecified 09/26/2014, 10/04/2015  . Pneumococcal Polysaccharide-23 12/22/2008  . Tdap 10/13/2012   Pertinent  Health Maintenance Due  Topic Date Due  . DEXA SCAN  10/25/1980  . PNA vac Low Risk Adult (2 of 2 - PCV13) 12/22/2009  . INFLUENZA VACCINE  07/22/2016   Fall Risk  10/08/2015 09/28/2013  Falls in the past year? No No  Risk for fall due to : History of fall(s) History of fall(s)  Risk for fall due to (comments): - 10/13 fell out of bed    Filed Vitals:   10/30/15 1526  BP: 135/78  Pulse: 70  Temp: 97.4 F (36.3 C)  Resp: 17  Height: 4' 11.3" (1.506 m)  Weight: 140 lb (63.504 kg)  SpO2: 100%   Body mass index is 28 kg/(m^2). Physical Exam  Constitutional: She is oriented to person, place, and time.  Cardiovascular: Normal rate, regular rhythm, normal heart sounds and intact distal pulses.   Pulmonary/Chest: Effort normal and breath sounds normal.  Abdominal: Soft. Bowel sounds are normal.  Musculoskeletal: Normal range of motion.  Uses power wheelchair to get around  Neurological: She is alert and oriented to person, place, and time.  Skin: There is  pallor.  Psychiatric: She has a normal mood and affect.    Labs reviewed:  Recent Labs  02/23/15 02/23/15 1419 07/12/15  NA 139 139 140  K 4.8 5.0 4.6  CL  --  106  --   CO2  --  25  --   GLUCOSE  --  85  --   BUN 19 23 23*  CREATININE 0.7 0.80  0.8  CALCIUM  --  8.6  --     Recent Labs  02/23/15 02/23/15 1419  AST 15 25  ALT 10 14  ALKPHOS 81 85  BILITOT  --  0.4  PROT  --  6.9  ALBUMIN  --  3.7    Recent Labs  02/23/15 1419  02/23/15 1806 02/27/15 07/12/15 09/20/15  WBC 4.5  --  9.0  --  8.2 10.3  HGB 14.7  < > 7.4* 7.7* 8.1* 8.4*  HCT 46.8*  < > 23.2* 25* 25* 26*  MCV 88.3  --  87.5  --   --   --   PLT 232  --  439*  --  393 448*  < > = values in this interval not displayed. Lab Results  Component Value Date   TSH 0.67 02/23/2015   Lab Results  Component Value Date   HGBA1C 6.1* 02/01/2014   Lab Results  Component Value Date   CHOL 145 02/23/2015   HDL 45 02/23/2015   LDLCALC 82 02/23/2015   TRIG 141 02/23/2015   CHOLHDL 3.4 02/01/2014    Significant Diagnostic Results in last 30 days:  No results found.  Assessment/Plan 1. Female bladder prolapse -is s/p pessary which was last changed 1 year ago when pt normally comes every 6 mos.  2. Vaginal pessary present -will discuss with gyn and arrange to remove and clean and replace with their instructions  Family/ staff Communication: discussed with pt, snf nurse and nurse manager  Labs/tests ordered: no new at present Lloyd L. Stevin Bielinski, D.O. [facility] [address], [place] [address] Cell Phone (Mon-Fri 8am-5pm):  [phone] On Call:  [phone] & follow prompts after 5pm & weekends Office Phone:  [phone] Office Fax:  [phone]

## 2015-11-05 ENCOUNTER — Non-Acute Institutional Stay (SKILLED_NURSING_FACILITY): Payer: Medicare Other | Admitting: Adult Health

## 2015-11-05 ENCOUNTER — Encounter: Payer: Self-pay | Admitting: Adult Health

## 2015-11-05 ENCOUNTER — Telehealth: Payer: Self-pay | Admitting: Cardiology

## 2015-11-05 DIAGNOSIS — D500 Iron deficiency anemia secondary to blood loss (chronic): Secondary | ICD-10-CM

## 2015-11-05 DIAGNOSIS — I82401 Acute embolism and thrombosis of unspecified deep veins of right lower extremity: Secondary | ICD-10-CM | POA: Diagnosis not present

## 2015-11-05 DIAGNOSIS — I82411 Acute embolism and thrombosis of right femoral vein: Secondary | ICD-10-CM

## 2015-11-05 LAB — CBC AND DIFFERENTIAL
HCT: 20 % — AB (ref 36–46)
Hemoglobin: 6.8 g/dL — AB (ref 12.0–16.0)
Platelets: 420 10*3/uL — AB (ref 150–399)
WBC: 9.4 10^3/mL

## 2015-11-05 NOTE — Telephone Encounter (Signed)
Sonya Brook, Sonya [name] at [facility] calling stating Sonya [name] has a large blood clot in (R) leg and also a GI bleed.  They are recommending for interventional radiology to put IVC filter in.  States pt is still alert but is thinking about it.  Sonya [name] wanted to be sure that it is OK with Dr. Marlou Porch. Advised will forward to him for his recommendations.

## 2015-11-05 NOTE — Telephone Encounter (Signed)
   Would not oppose IVC filter if she chooses to go forward with this. Obviously, she can not undergo anticoagulation given her GI bleeding history.  Candee Furbish, MD

## 2015-11-05 NOTE — Telephone Encounter (Signed)
Notified Christie,NP at [facility] of Dr. Marlou Porch recommendations for IVC filter.  She states she will advise pt.

## 2015-11-05 NOTE — Telephone Encounter (Signed)
New message      Pt has a large blood clot in rt leg and also has a GI bleed.  [employer] [facility] is  recommending an IVC filter. Pt want to make sure it is ok with Dr Marlou Porch.  Please call.

## 2015-11-06 ENCOUNTER — Other Ambulatory Visit: Payer: Self-pay | Admitting: Radiology

## 2015-11-06 ENCOUNTER — Encounter: Payer: Self-pay | Admitting: Internal Medicine

## 2015-11-06 ENCOUNTER — Non-Acute Institutional Stay (SKILLED_NURSING_FACILITY): Payer: Medicare Other | Admitting: Internal Medicine

## 2015-11-06 ENCOUNTER — Other Ambulatory Visit: Payer: Self-pay | Admitting: Internal Medicine

## 2015-11-06 DIAGNOSIS — R195 Other fecal abnormalities: Secondary | ICD-10-CM | POA: Diagnosis not present

## 2015-11-06 DIAGNOSIS — D500 Iron deficiency anemia secondary to blood loss (chronic): Secondary | ICD-10-CM | POA: Diagnosis not present

## 2015-11-06 DIAGNOSIS — I82491 Acute embolism and thrombosis of other specified deep vein of right lower extremity: Secondary | ICD-10-CM

## 2015-11-06 DIAGNOSIS — I824Z1 Acute embolism and thrombosis of unspecified deep veins of right distal lower extremity: Secondary | ICD-10-CM

## 2015-11-06 DIAGNOSIS — D649 Anemia, unspecified: Secondary | ICD-10-CM | POA: Diagnosis not present

## 2015-11-06 LAB — CBC AND DIFFERENTIAL
HEMATOCRIT: 20 % — AB (ref 36–46)
HEMOGLOBIN: 6.4 g/dL — AB (ref 12.0–16.0)
PLATELETS: 416 10*3/uL — AB (ref 150–399)
WBC: 9.5 10*3/mL

## 2015-11-06 NOTE — Progress Notes (Signed)
Patient ID: Sonya [name], female   DOB: 1913-08-19, 81 y.o.   MRN: [id number]  Location:  [facility] Provider:  Maralee Higuchi L. Phares Zaccone, D.O., C.M.D. Hollace Kinnier, DO  Code Status:  DNR Goals of care: Advanced Directive information Does patient have an advance directive?: Yes, Type of Advance Directive: Rosa Sanchez;Living will;Out of facility DNR (pink MOST or yellow form), Pre-existing out of facility DNR order (yellow form or pink MOST form): Yellow form placed in chart (order not valid for inpatient use)  Chief Complaint  Patient presents with  . Acute Visit    right leg DVT    HPI:  Pt is a 81 y.o. white female seen today for an acute visit to further discuss placement of an IVC filter for right LE DVT which wound up being much more extensive on final quality mobile ultrasound report.  Unfortunately, Mrs. Ririe has also not tolerated the xarelto and her hgb has been dropping--today to 6.4.  She is high risk for PE and, after extensive discussion, does agree to placement of an IVC filter by interventional radiology.  She understands tha tshe is at high risk of death from bleeding if she continues on anticoagulation and high risk for PE if does not get the filter as an alternative.  She requested her friend, Opal Sidles, be present with her to hear the details of the procedure and why she needs it rather than blood thinners.  She's had two heme positive stools also since she's been on xarelto which began last week.  We've decided to continue the xarelto until she can have the procedure tomorrow.  Review of Systems  Constitutional: Negative for fever, chills and malaise/fatigue.  HENT: Positive for hearing loss.   Eyes: Positive for blurred vision.       Very poor vision from macular degeneration and glaucoma  Respiratory: Negative for cough, hemoptysis and shortness of breath.   Cardiovascular: Negative for chest pain.       Right leg warmth and mild swelling of thigh/knee    Gastrointestinal: Negative for abdominal pain.  Genitourinary: Negative for dysuria.  Musculoskeletal: Negative for falls.  Neurological: Negative for dizziness and weakness.  Psychiatric/Behavioral: Negative for memory loss.    Past Medical History  Diagnosis Date  . Acute bronchitis   . Asthma   . TIA (transient ischemic attack)   . Herpes zoster complication 123456    complicated, left eye involvement  . Nontoxic uninodular goiter     negative [place] 2008  . Macular degeneration (senile) of retina, unspecified 2012    wet  . Acute myocardial infarction, unspecified site, episode of care unspecified 03/07/11  . Coronary atherosclerosis of native coronary artery      s/p stent diagonal branch 1996; RCA 03/07/11  . Chronic diastolic heart failure ([place])   . Cardiomegaly   . Diaphragmatic hernia without mention of obstruction or gangrene   . Irritable bowel syndrome   . Other specified genital prolapse(618.89)     corrected with pessary  . Edema 2006  . Other abnormal blood chemistry   . Debility, unspecified   . Open wound of knee, leg (except thigh), and ankle, without mention of complication 0000000    left leg, complicated wound, extended healing, resolved 01/2013  . Transient ischemic attack (TIA), and cerebral infarction without residual deficits 2008  . Shoulder pain 03/30/2013  . Hypertension   . Depression   . Finger numbness 12/28/2013    Left thumb, 1, 2nd fingers  .  Dysphagia S/P CVA (cerebrovascular accident) 02/14/2014  . CHF (congestive heart failure) ([place])   . Stroke ([place])   . Chronic diastolic heart failure ([place]) 01/31/2014    01/2014: 2d echo: EF 0000000, grade 1 diastolic dysfunction    Past Surgical History  Procedure Laterality Date  . Appendectomy    . Tonsillectomy and adenoidectomy    . Cataract extraction    . Cardiac catheterization  1996, 2001, 2012  . Carotid stent      diagonal branch of LCA    Allergies  Allergen Reactions  . Crestor [Rosuvastatin]  Other (See Comments)    Leg Weakness   . Ace Inhibitors Cough  . Plavix [Clopidogrel] Nausea And Vomiting      Medication List       This list is accurate as of: 11/06/15  6:09 PM.  Always use your most recent med list.               antiseptic oral rinse Liqd  15 mLs by Mouth Rinse route at bedtime.     DERMA-SMOOTHE/FS SCALP 0.01 % Oil  Apply 1 application topically 2 (two) times daily as needed (for scalp).     esomeprazole 40 MG capsule  Commonly known as:  NEXIUM  Take 40 mg by mouth daily.     ferrous sulfate 325 (65 FE) MG tablet  Take 325 mg by mouth 2 (two) times daily with a meal.     furosemide 20 MG tablet  Commonly known as:  LASIX  Take 20 mg by mouth every other day.     ICAPS PO  Take 1 tablet by mouth daily.     losartan 50 MG tablet  Commonly known as:  COZAAR  Take 50 mg by mouth daily.     LOTEMAX 0.5 % Gel  Generic drug:  Loteprednol Etabonate  Apply 0.5 inches to eye 2 (two) times daily. Left eye     LOTEMAX 0.5 % ophthalmic suspension  Generic drug:  loteprednol     metoprolol succinate 25 MG 24 hr tablet  Commonly known as:  TOPROL-XL  Take 25 mg by mouth daily.     nitroGLYCERIN 0.4 MG SL tablet  Commonly known as:  NITROSTAT  Place 0.4 mg under the tongue every 5 (five) minutes as needed for chest pain.     Rivaroxaban 15 MG Tabs tablet  Commonly known as:  XARELTO  Take 15 mg by mouth 2 (two) times daily with a meal.     sertraline 50 MG tablet  Commonly known as:  ZOLOFT  Take 50 mg by mouth daily.     simvastatin 20 MG tablet  Commonly known as:  ZOCOR  Take 1 tablet (20 mg total) by mouth every other day.     triamcinolone 0.025 % cream  Commonly known as:  KENALOG  Apply 1 application topically 2 (two) times daily as needed (for itchy areas).        Immunization History  Administered Date(s) Administered  . Influenza Whole 09/26/2013  . Influenza-Unspecified 09/26/2014, 10/04/2015  . Pneumococcal  Polysaccharide-23 12/22/2008  . Tdap 10/13/2012   Pertinent  Health Maintenance Due  Topic Date Due  . DEXA SCAN  10/25/1980  . PNA vac Low Risk Adult (2 of 2 - PCV13) 12/22/2009  . INFLUENZA VACCINE  07/22/2016   Fall Risk  10/08/2015 09/28/2013  Falls in the past year? No No  Risk for fall due to : History of fall(s) History of fall(s)  Risk  for fall due to (comments): - 10/13 fell out of bed    Filed Vitals:   11/06/15 1150  BP: 112/60  Pulse: 79  Temp: 98.3 F (36.8 C)  Resp: 17  Weight: 140 lb 12.8 oz (63.866 kg)  SpO2: 97%   Body mass index is 28.16 kg/(m^2). Physical Exam  Constitutional: She is oriented to person, place, and time. She appears well-developed and well-nourished. No distress.  Cardiovascular: Normal rate, regular rhythm and normal heart sounds.   Pulmonary/Chest: Effort normal and breath sounds normal. No respiratory distress.  Abdominal: Soft. Bowel sounds are normal.  Musculoskeletal: She exhibits edema. She exhibits no tenderness.  Of right upper leg and knee region  Neurological: She is alert and oriented to person, place, and time.  Skin: Skin is warm and dry.  Psychiatric: She has a normal mood and affect.    Labs reviewed:  Recent Labs  02/23/15 02/23/15 1419 07/12/15  NA 139 139 140  K 4.8 5.0 4.6  CL  --  106  --   CO2  --  25  --   GLUCOSE  --  85  --   BUN 19 23 23*  CREATININE 0.7 0.80 0.8  CALCIUM  --  8.6  --     Recent Labs  02/23/15 02/23/15 1419  AST 15 25  ALT 10 14  ALKPHOS 81 85  BILITOT  --  0.4  PROT  --  6.9  ALBUMIN  --  3.7    Recent Labs  02/23/15 1419  02/23/15 1806  09/20/15 11/05/15 11/06/15  WBC 4.5  --  9.0  < > 10.3 9.4 9.5  HGB 14.7  < > 7.4*  < > 8.4* 6.8* 6.4*  HCT 46.8*  < > 23.2*  < > 26* 20* 20*  MCV 88.3  --  87.5  --   --   --   --   PLT 232  --  439*  < > 448* 420* 416*  < > = values in this interval not displayed. Lab Results  Component Value Date   TSH 0.67 02/23/2015   Lab  Results  Component Value Date   HGBA1C 6.1* 02/01/2014   Lab Results  Component Value Date   CHOL 145 02/23/2015   HDL 45 02/23/2015   LDLCALC 82 02/23/2015   TRIG 141 02/23/2015   CHOLHDL 3.4 02/01/2014    Significant Diagnostic Results in last 30 days:  Right portable venous ultrasound 11/02/15:  Acute non-occlusive DVT within right proximal vein and right mid femoral vein; acute occlusive DVT right distal femoral vein, right popliteal vein, and right posterior tibial vein.  Assessment/Plan 1. Acute deep vein thrombosis (DVT) of other specified vein of right lower extremity ([place]) -noted 11/11 -has remained hemodynamically stable w/o signs or symptoms of PE -pt had difficulty accepting diagnosis and took some time to decide on IVC filter, but now agrees -in the meantime, she's been on xarelto and her hgb has been dropping   2. Anemia due to chronic blood loss -had this problem with plavix previously after her cardiac stents so this and asa stopped, but now she had to take xarelto for DVT to prevent PE   3. Heme + stool -x2 since starting xarelto -will d/c xarelto after IVC filter is placed to prevent PE  Family/ staff Communication: discussed with pt and friend Opal Sidles and also SNF nurse and Midwife, DON  Labs/tests ordered:  IVC filter placement by IR ASAP  Heena Woodbury L.  Kenzel Ruesch, D.O. [facility] [address], [place] [address] Cell Phone (Mon-Fri 8am-5pm):  [phone] On Call:  [phone] & follow prompts after 5pm & weekends Office Phone:  [phone] Office Fax:  [phone]

## 2015-11-07 ENCOUNTER — Encounter: Payer: Self-pay | Admitting: Adult Health

## 2015-11-07 ENCOUNTER — Ambulatory Visit (HOSPITAL_BASED_OUTPATIENT_CLINIC_OR_DEPARTMENT_OTHER)
Admission: RE | Admit: 2015-11-07 | Discharge: 2015-11-07 | Disposition: A | Discharge status: Home / Self Care | Payer: Medicare Other | Source: Ambulatory Visit | Attending: Diagnostic Radiology | Admitting: Diagnostic Radiology

## 2015-11-07 ENCOUNTER — Ambulatory Visit (HOSPITAL_COMMUNITY)
Admission: RE | Admit: 2015-11-07 | Discharge: 2015-11-07 | Disposition: A | Discharge status: Home / Self Care | Payer: Medicare Other | Source: Ambulatory Visit | Attending: Internal Medicine | Admitting: Internal Medicine

## 2015-11-07 DIAGNOSIS — Z955 Presence of coronary angioplasty implant and graft: Secondary | ICD-10-CM | POA: Insufficient documentation

## 2015-11-07 DIAGNOSIS — I82492 Acute embolism and thrombosis of other specified deep vein of left lower extremity: Secondary | ICD-10-CM | POA: Diagnosis not present

## 2015-11-07 DIAGNOSIS — I2510 Atherosclerotic heart disease of native coronary artery without angina pectoris: Secondary | ICD-10-CM | POA: Insufficient documentation

## 2015-11-07 DIAGNOSIS — D649 Anemia, unspecified: Secondary | ICD-10-CM | POA: Diagnosis not present

## 2015-11-07 DIAGNOSIS — Z538 Procedure and treatment not carried out for other reasons: Secondary | ICD-10-CM | POA: Diagnosis not present

## 2015-11-07 DIAGNOSIS — I252 Old myocardial infarction: Secondary | ICD-10-CM | POA: Insufficient documentation

## 2015-11-07 DIAGNOSIS — J45909 Unspecified asthma, uncomplicated: Secondary | ICD-10-CM | POA: Diagnosis not present

## 2015-11-07 DIAGNOSIS — I82401 Acute embolism and thrombosis of unspecified deep veins of right lower extremity: Secondary | ICD-10-CM

## 2015-11-07 DIAGNOSIS — K589 Irritable bowel syndrome without diarrhea: Secondary | ICD-10-CM | POA: Insufficient documentation

## 2015-11-07 DIAGNOSIS — Z8673 Personal history of transient ischemic attack (TIA), and cerebral infarction without residual deficits: Secondary | ICD-10-CM | POA: Insufficient documentation

## 2015-11-07 DIAGNOSIS — Z7901 Long term (current) use of anticoagulants: Secondary | ICD-10-CM | POA: Insufficient documentation

## 2015-11-07 DIAGNOSIS — H3530 Unspecified macular degeneration: Secondary | ICD-10-CM | POA: Insufficient documentation

## 2015-11-07 DIAGNOSIS — F329 Major depressive disorder, single episode, unspecified: Secondary | ICD-10-CM | POA: Insufficient documentation

## 2015-11-07 DIAGNOSIS — I824Z1 Acute embolism and thrombosis of unspecified deep veins of right distal lower extremity: Secondary | ICD-10-CM

## 2015-11-07 DIAGNOSIS — I5032 Chronic diastolic (congestive) heart failure: Secondary | ICD-10-CM | POA: Diagnosis not present

## 2015-11-07 DIAGNOSIS — Z87891 Personal history of nicotine dependence: Secondary | ICD-10-CM | POA: Diagnosis not present

## 2015-11-07 DIAGNOSIS — I110 Hypertensive heart disease with heart failure: Secondary | ICD-10-CM | POA: Diagnosis not present

## 2015-11-07 DIAGNOSIS — I744 Embolism and thrombosis of arteries of extremities, unspecified: Secondary | ICD-10-CM | POA: Diagnosis not present

## 2015-11-07 LAB — BASIC METABOLIC PANEL
Anion gap: 7 (ref 5–15)
BUN: 21 mg/dL — ABNORMAL HIGH (ref 6–20)
CHLORIDE: 108 mmol/L (ref 101–111)
CO2: 24 mmol/L (ref 22–32)
CREATININE: 0.88 mg/dL (ref 0.44–1.00)
Calcium: 8.6 mg/dL — ABNORMAL LOW (ref 8.9–10.3)
GFR, EST NON AFRICAN AMERICAN: 52 mL/min — AB (ref >60)
Glucose, Bld: 107 mg/dL — ABNORMAL HIGH (ref 65–99)
POTASSIUM: 4.3 mmol/L (ref 3.5–5.1)
SODIUM: 139 mmol/L (ref 135–145)

## 2015-11-07 LAB — APTT: APTT: 28 s (ref 24–37)

## 2015-11-07 LAB — CBC WITH DIFFERENTIAL/PLATELET
BASOS ABS: 0.1 10*3/uL (ref 0.0–0.1)
Basophils Relative: 1 %
EOS ABS: 0.5 10*3/uL (ref 0.0–0.7)
Eosinophils Relative: 5 %
HCT: 23 % — ABNORMAL LOW (ref 36.0–46.0)
HEMOGLOBIN: 7.2 g/dL — AB (ref 12.0–15.0)
LYMPHS ABS: 2.1 10*3/uL (ref 0.7–4.0)
LYMPHS PCT: 20 %
MCH: 28.8 pg (ref 26.0–34.0)
MCHC: 31.3 g/dL (ref 30.0–36.0)
MCV: 92 fL (ref 78.0–100.0)
Monocytes Absolute: 0.9 10*3/uL (ref 0.1–1.0)
Monocytes Relative: 9 %
NEUTROS PCT: 65 %
Neutro Abs: 6.7 10*3/uL (ref 1.7–7.7)
Platelets: 445 10*3/uL — ABNORMAL HIGH (ref 150–400)
RBC: 2.5 MIL/uL — AB (ref 3.87–5.11)
RDW: 14.6 % (ref 11.5–15.5)
WBC: 10.3 10*3/uL (ref 4.0–10.5)

## 2015-11-07 LAB — PROTIME-INR
INR: 1.17 (ref 0.00–1.49)
PROTHROMBIN TIME: 15.1 s (ref 11.6–15.2)

## 2015-11-07 MED ORDER — SODIUM CHLORIDE 0.9 % IV SOLN
INTRAVENOUS | Status: DC
  Administered 2015-11-07: 12:00:00 via INTRAVENOUS

## 2015-11-07 NOTE — Progress Notes (Signed)
*  Preliminary Results* Right lower extremity venous duplex completed. Right lower extremity is negative for deep vein thrombosis. There is evidence of superficial vein thrombosis involving the right lesser saphenous vein. There is no evidence of right Baker's cyst.  Preliminary results discussed with Dr. Anselm Pancoast.  11/07/2015 1:11 PM  Maudry Mayhew, RVT, RDCS, RDMS

## 2015-11-07 NOTE — Sedation Documentation (Signed)
Duplex neg  Procedure cancelled per Dr Anselm Pancoast, Dr. Mariea Clonts notified

## 2015-11-07 NOTE — H&P (Signed)
Chief Complaint: Patient was seen in consultation today for IVC filter placement at the request of Reed,Tiffany L  Referring Physician(s): Reed,Tiffany L  History of Present Illness: Sonya [name] is a 81 y.o. female with new (R)LE DVT. She was placed on Xarelto, but has developed anemia and heme + stools. She is to come off the Xarelto due to risk of GI bleed, but IR is asked to place IVC filter given her recent DVT. PMHx, meds, labs reviewed. Has been NPO today Offers no c/o  Past Medical History  Diagnosis Date  . Acute bronchitis   . Asthma   . TIA (transient ischemic attack)   . Herpes zoster complication 123456    complicated, left eye involvement  . Nontoxic uninodular goiter     negative [place] 2008  . Macular degeneration (senile) of retina, unspecified 2012    wet  . Acute myocardial infarction, unspecified site, episode of care unspecified 03/07/11  . Coronary atherosclerosis of native coronary artery      s/p stent diagonal branch 1996; RCA 03/07/11  . Chronic diastolic heart failure ([place])   . Cardiomegaly   . Diaphragmatic hernia without mention of obstruction or gangrene   . Irritable bowel syndrome   . Other specified genital prolapse(618.89)     corrected with pessary  . Edema 2006  . Other abnormal blood chemistry   . Debility, unspecified   . Open wound of knee, leg (except thigh), and ankle, without mention of complication 0000000    left leg, complicated wound, extended healing, resolved 01/2013  . Transient ischemic attack (TIA), and cerebral infarction without residual deficits 2008  . Shoulder pain 03/30/2013  . Hypertension   . Depression   . Finger numbness 12/28/2013    Left thumb, 1, 2nd fingers  . Dysphagia S/P CVA (cerebrovascular accident) 02/14/2014  . CHF (congestive heart failure) ([place])   . Stroke ([place])   . Chronic diastolic heart failure ([place]) 01/31/2014    01/2014: 2d echo: EF 0000000, grade 1 diastolic dysfunction     Past Surgical  History  Procedure Laterality Date  . Appendectomy    . Tonsillectomy and adenoidectomy    . Cataract extraction    . Cardiac catheterization  1996, 2001, 2012  . Carotid stent      diagonal branch of LCA    Allergies: Crestor; Ace inhibitors; and Plavix  Medications: Prior to Admission medications   Medication Sig Start Date End Date Taking? Authorizing Provider  antiseptic oral rinse (BIOTENE) LIQD 15 mLs by Mouth Rinse route at bedtime.   Yes Historical Provider, MD  Carboxymethylcellul-Glycerin (OPTIVE) 0.5-0.9 % SOLN Place 2 drops into the right eye every 2 (two) hours as needed (dry eyes).   Yes Historical Provider, MD  esomeprazole (NEXIUM) 40 MG capsule Take 40 mg by mouth daily.    Yes Historical Provider, MD  ferrous sulfate 325 (65 FE) MG tablet Take 325 mg by mouth 2 (two) times daily with a meal.    Yes Historical Provider, MD  Fluocinolone Acetonide (DERMA-SMOOTHE/FS SCALP) 0.01 % OIL Apply 1 application topically 2 (two) times daily as needed (for scalp).   Yes Historical Provider, MD  furosemide (LASIX) 20 MG tablet Take 20 mg by mouth every other day.  03/14/14  Yes Historical Provider, MD  losartan (COZAAR) 50 MG tablet Take 50 mg by mouth daily.   Yes Historical Provider, MD  Loteprednol Etabonate (LOTEMAX) 0.5 % GEL Apply 0.5 inches to eye 2 (two) times daily.  Left eye   Yes Historical Provider, MD  metoprolol succinate (TOPROL-XL) 25 MG 24 hr tablet Take 25 mg by mouth daily.   Yes Historical Provider, MD  Multiple Vitamins-Minerals (ICAPS PO) Take 1 tablet by mouth daily.   Yes Historical Provider, MD  nitroGLYCERIN (NITROSTAT) 0.4 MG SL tablet Place 0.4 mg under the tongue every 5 (five) minutes as needed for chest pain.    Yes Historical Provider, MD  Rivaroxaban (XARELTO) 15 MG TABS tablet Take 15 mg by mouth 2 (two) times daily with a meal.    Yes Historical Provider, MD  sertraline (ZOLOFT) 50 MG tablet Take 50 mg by mouth daily.   Yes Historical Provider, MD    simvastatin (ZOCOR) 20 MG tablet Take 1 tablet (20 mg total) by mouth every other day. 05/30/14  Yes Claudette Jeri Cos, NP  triamcinolone (KENALOG) 0.025 % cream Apply 1 application topically 2 (two) times daily as needed (for itchy areas).    Yes Historical Provider, MD     Family History  Problem Relation Age of Onset  . Diabetes Sister   . Cancer Mother   . Stroke Father   . Diabetes Father   . Dementia Sister     Social History   Social History  . Marital Status: Widowed    Spouse Name: N/A  . Number of Children: 0  . Years of Education: N/A   Occupational History  . 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 Smoking status: Former Smoker    Quit date: 12/23/1963  . Smokeless tobacco: Never Used  . Alcohol Use: No  . Drug Use: No  . Sexual Activity: No   Other Topics Concern  . Not on file   Social History Narrative      Resides at [facility] since 2009. Retired Microbiologist    Widowed with no children.    Living Will, DNR   Exercises predominantly walking about a mile daily   Does not drive   Caffeine beverages occasional   Stopped smoking 1965   1/2 glass of wine almost daily     Review of Systems: A 12 point ROS discussed and pertinent positives are indicated in the HPI above.  All other systems are negative.  Review of Systems  Vital Signs: BP 146/82 mmHg  Pulse 72  Temp(Src) 97.2 F (36.2 C) (Oral)  Resp 16  Ht 4' 11.25" (1.505 m)  Wt 140 lb (63.504 kg)  BMI 28.04 kg/m2  SpO2 99%  Physical Exam  Constitutional: She is oriented to person, place, and time. She appears well-developed. No distress.  HENT:  Head: Normocephalic.  Mouth/Throat: Oropharynx is clear and moist.  Neck: Normal range of motion. No JVD present. No tracheal deviation present.  Cardiovascular: Normal rate, regular rhythm and normal heart sounds.   Pulmonary/Chest: Effort normal and breath sounds normal. No  respiratory distress.  Musculoskeletal: She exhibits no edema or tenderness.  Neurological: She is alert and oriented to person, place, and time.  Skin: Skin is warm and dry. She is not diaphoretic.  Psychiatric: She has a normal mood and affect. Judgment normal.    Mallampati Score:  MD Evaluation Airway: WNL Heart: WNL Abdomen: WNL Chest/ Lungs: WNL ASA  Classification: 3 Mallampati/Airway Score: Two  Imaging: No results found.  Labs:  CBC:  Recent Labs  07/12/15 09/20/15 11/05/15 11/06/15  WBC 8.2 10.3 9.4 9.5  HGB 8.1*  8.4* 6.8* 6.4*  HCT 25* 26* 20* 20*  PLT 393 448* 420* 416*    COAGS: No results for input(s): INR, APTT in the last 8760 hours.  BMP:  Recent Labs  02/23/15 02/23/15 1419 07/12/15  NA 139 139 140  K 4.8 5.0 4.6  CL  --  106  --   CO2  --  25  --   GLUCOSE  --  85  --   BUN 19 23 23*  CALCIUM  --  8.6  --   CREATININE 0.7 0.80 0.8  GFRNONAA  --  59*  --   GFRAA  --  68*  --     LIVER FUNCTION TESTS:  Recent Labs  02/23/15 02/23/15 1419  BILITOT  --  0.4  AST 15 25  ALT 10 14  ALKPHOS 81 85  PROT  --  6.9  ALBUMIN  --  3.7    TUMOR MARKERS: No results for input(s): AFPTM, CEA, CA199, CHROMGRNA in the last 8760 hours.  Assessment and Plan: (R)LE DVT Anemia, heme + stools, high risk for GI bleed with anticoagulation Plan for IVC filter placement, discussed possibility of retrievable vs permanent. Labs pending Risks and Benefits discussed with the patient including, but not limited to bleeding, infection, contrast induced renal failure, filter fracture or migration which can lead to emergency surgery or even death, strut penetration with damage or irritation to adjacent structures and caval thrombosis. All of the patient's questions were answered, patient is agreeable to proceed. Consent signed and in chart.    SignedAscencion Dike 11/07/2015, 11:24 AM   I spent a total of 20 minutes in face to face in clinical  consultation, greater than 50% of which was counseling/coordinating care for IVC filter

## 2015-11-07 NOTE — Progress Notes (Signed)
Patient ID: Sonya [name], female   DOB: 27-Sep-1913, 81 y.o.   MRN: [id number]     Nursing Home Location:  [place]   Code Status: DNR  Patient Care Team: Gayland Curry, DO as PCP - General (Geriatric Medicine) [facility] Jerline Pain, MD as Consulting Physician (Cardiology) [name], NP as Nurse Practitioner (Obstetrics and Gynecology)   Place of Service: SNF (31)  Chief Complaint  Patient presents with  . Acute Visit    dvt    HPI:  81 y.o. female residing at [facility], skilled care section. I am here to f/u regarding a positive doppler study.  She reported swelling and tenderness of the RLE and a venous doppler was performed on 11/02/15 showing an extensive dvt in the popliteal and femoral area. She was started on xarelto at that time. She denied any SOB or CP.  She has a hx of heme pos stool with blood transfusion in March of 2016.  At that time aspirin was discontinued and she was started on iron. No further work up was performed due to her age and debility. She has not had any overt bleeding but her Hgb has remained in the 8.0 range since that time. Over the phone on 11/11 I discussed the need for an IVC filter given her risk of bleeding. She was understandably apprehensive about this given her age and wanted to think about it over the weekend.  Her Hgb returned at 6.8 on 11/14.   Review of Systems:  Review of Systems  Constitutional: Negative for fever, activity change and appetite change.  Respiratory: Negative for cough and shortness of breath.   Cardiovascular: Positive for leg swelling. Negative for chest pain and palpitations.  Gastrointestinal: Negative for abdominal pain, constipation, abdominal distention and anal bleeding.  Neurological: Positive for facial asymmetry (h/o cva). Negative for tremors, syncope and weakness.  Hematological: Negative for adenopathy. Does not bruise/bleed easily.     Medications: Patient's Medications  New Prescriptions   No medications on file  Previous Medications   ANTISEPTIC ORAL RINSE (BIOTENE) LIQD    15 mLs by Mouth Rinse route at bedtime.   CARBOXYMETHYLCELLUL-GLYCERIN (OPTIVE) 0.5-0.9 % SOLN    Place 2 drops into the right eye every 2 (two) hours as needed (dry eyes).   ESOMEPRAZOLE (NEXIUM) 40 MG CAPSULE    Take 40 mg by mouth daily.    FERROUS SULFATE 325 (65 FE) MG TABLET    Take 325 mg by mouth 2 (two) times daily with a meal.    FLUOCINOLONE ACETONIDE (DERMA-SMOOTHE/FS SCALP) 0.01 % OIL    Apply 1 application topically 2 (two) times daily as needed (for scalp).   FUROSEMIDE (LASIX) 20 MG TABLET    Take 20 mg by mouth every other day.    LOSARTAN (COZAAR) 50 MG TABLET    Take 50 mg by mouth daily.   LOTEPREDNOL ETABONATE (LOTEMAX) 0.5 % GEL    Apply 0.5 inches to eye 2 (two) times daily. Left eye   METOPROLOL SUCCINATE (TOPROL-XL) 25 MG 24 HR TABLET    Take 25 mg by mouth daily.   MULTIPLE VITAMINS-MINERALS (ICAPS PO)    Take 1 tablet by mouth daily.   NITROGLYCERIN (NITROSTAT) 0.4 MG SL TABLET    Place 0.4 mg under the tongue every 5 (five) minutes as needed for chest pain.    RIVAROXABAN (XARELTO) 15 MG TABS TABLET    Take 15 mg by mouth 2 (two)  times daily with a meal.    SERTRALINE (ZOLOFT) 50 MG TABLET    Take 50 mg by mouth daily.   SIMVASTATIN (ZOCOR) 20 MG TABLET    Take 1 tablet (20 mg total) by mouth every other day.   TRIAMCINOLONE (KENALOG) 0.025 % CREAM    Apply 1 application topically 2 (two) times daily as needed (for itchy areas).   Modified Medications   No medications on file  Discontinued Medications   LOTEMAX 0.5 % OPHTHALMIC SUSPENSION         Physical Exam:  Filed Vitals:   11/05/15 1411  BP: 140/70  Pulse: 80  Temp: 97.2 F (36.2 C)  Resp: 16  SpO2: 96%    Physical Exam  Constitutional: She is oriented to person, place, and time. No distress.  Neck: No JVD present.  Cardiovascular: Normal rate  and regular rhythm.   RLE +1 edema with warmth and erythema  Pulmonary/Chest: Effort normal and breath sounds normal. No respiratory distress. She has no wheezes.  Neurological: She is alert and oriented to person, place, and time.  Skin: Skin is warm and dry. She is not diaphoretic.  Psychiatric: Affect normal.    Wt Readings from Last 3 Encounters:  11/06/15 140 lb 12.8 oz (63.866 kg)  10/30/15 140 lb (63.504 kg)  10/29/15 140 lb (63.504 kg)     Labs reviewed/Significant Diagnostic Results:  Basic Metabolic Panel:  Recent Labs  02/23/15 02/23/15 1419 07/12/15  NA 139 139 140  K 4.8 5.0 4.6  CL  --  106  --   CO2  --  25  --   GLUCOSE  --  85  --   BUN 19 23 23*  CREATININE 0.7 0.80 0.8  CALCIUM  --  8.6  --    Liver Function Tests:  Recent Labs  02/23/15 02/23/15 1419  AST 15 25  ALT 10 14  ALKPHOS 81 85  BILITOT  --  0.4  PROT  --  6.9  ALBUMIN  --  3.7   No results for input(s): LIPASE, AMYLASE in the last 8760 hours. No results for input(s): AMMONIA in the last 8760 hours. CBC:  Recent Labs  02/23/15 1419  02/23/15 1806  09/20/15 11/05/15 11/06/15  WBC 4.5  --  9.0  < > 10.3 9.4 9.5  HGB 14.7  < > 7.4*  < > 8.4* 6.8* 6.4*  HCT 46.8*  < > 23.2*  < > 26* 20* 20*  MCV 88.3  --  87.5  --   --   --   --   PLT 232  --  439*  < > 448* 420* 416*  < > = values in this interval not displayed. CBG: No results for input(s): GLUCAP in the last 8760 hours. TSH:  Recent Labs  02/23/15  TSH 0.67   A1C: Lab Results  Component Value Date   HGBA1C 6.1* 02/01/2014   Lipid Panel:  Recent Labs  02/23/15  CHOL 145  HDL 45  LDLCALC 82  TRIG 141       Assessment/Plan  1. Acute deep vein thrombosis (DVT) of femoral vein of right lower extremity ([facility]) -I discussed with her the risk and benefits of an IVC filter.  Despite being 89 she is able to make her own decisions with some MCI.  She will continue on xarelto and make her final decision in the  morning.   2. Anemia due to chronic blood loss -resident is concerned over the  accuracy of the blood work so we will repeat the CBC in the morning.  -I increased the ferrous sulfate to BID, however, she will need a transfusion if the repeat blood work in abnormal.   These findings were discussed with Dr. Mariea Clonts.  Cindi Carbon, ANP [facility] [phone]

## 2015-11-07 NOTE — Sedation Documentation (Signed)
MD at bedside.d/w pt need to do duplex pre IVC vas lab notified

## 2015-11-08 ENCOUNTER — Non-Acute Institutional Stay (SKILLED_NURSING_FACILITY): Payer: Medicare Other | Admitting: Adult Health

## 2015-11-08 ENCOUNTER — Encounter: Payer: Self-pay | Admitting: Adult Health

## 2015-11-08 DIAGNOSIS — I82811 Embolism and thrombosis of superficial veins of right lower extremities: Secondary | ICD-10-CM

## 2015-11-08 DIAGNOSIS — D500 Iron deficiency anemia secondary to blood loss (chronic): Secondary | ICD-10-CM | POA: Diagnosis not present

## 2015-11-08 NOTE — Progress Notes (Signed)
Patient ID: Sonya [name], female   DOB: 1913-05-27, 81 y.o.   MRN: [id number]     Nursing Home Location:  [place]   Code Status: DNR  Patient Care Team: Gayland Curry, DO as PCP - General (Geriatric Medicine) [facility] Jerline Pain, MD as Consulting Physician (Cardiology) [name], NP as Nurse Practitioner (Obstetrics and Gynecology)   Place of Service: SNF (31)  Chief Complaint  Patient presents with  . Acute Visit    f/u anemia, u/s f/u    HPI:  81 y.o. female residing at [facility], skilled care section. I am here to f/u regarding a positive doppler study.  She reported swelling and tenderness of the RLE and a venous doppler was performed on 11/02/15 showing an extensive dvt in the popliteal and femoral area. She was started on xarelto at that time. She denied any SOB or CP.  She has a hx of heme pos stool with blood transfusion in March of 2016.  At that time aspirin was discontinued and she was started on iron. No further work up was performed due to her age and debility. She has not had any overt bleeding but her Hgb has remained in the 8.0 range since that time. Over the phone on 11/11 I discussed the need for an IVC filter given her risk of bleeding. She was understandably apprehensive about this given her age and wanted to think about it over the weekend. She decided to go forward with the procedure and it was scheduled for 11/16.  A doppler study was performed on the right leg again prior to the procedure only showing a superficial saphenous clot. It was recommended that she have another doppler study in 1 week. Her Hgb returned at 6.4 on 11/15 and 6.8 on 11/14.   Review of Systems:  Review of Systems  Constitutional: Negative for fever, activity change and appetite change.  Respiratory: Negative for cough and shortness of breath.   Cardiovascular: Negative for chest pain, palpitations and leg  swelling.  Gastrointestinal: Negative for abdominal pain, constipation, abdominal distention and anal bleeding.  Neurological: Positive for facial asymmetry (h/o cva). Negative for tremors, syncope and weakness.  Hematological: Negative for adenopathy. Does not bruise/bleed easily.    Medications: Patient's Medications  New Prescriptions   No medications on file  Previous Medications   ANTISEPTIC ORAL RINSE (BIOTENE) LIQD    15 mLs by Mouth Rinse route at bedtime.   CARBOXYMETHYLCELLUL-GLYCERIN (OPTIVE) 0.5-0.9 % SOLN    Place 2 drops into the right eye every 2 (two) hours as needed (dry eyes).   ESOMEPRAZOLE (NEXIUM) 40 MG CAPSULE    Take 40 mg by mouth daily.    FERROUS SULFATE 325 (65 FE) MG TABLET    Take 325 mg by mouth 2 (two) times daily with a meal.    FLUOCINOLONE ACETONIDE (DERMA-SMOOTHE/FS SCALP) 0.01 % OIL    Apply 1 application topically 2 (two) times daily as needed (for scalp).   FUROSEMIDE (LASIX) 20 MG TABLET    Take 20 mg by mouth every other day.    LOSARTAN (COZAAR) 50 MG TABLET    Take 50 mg by mouth daily.   LOTEPREDNOL ETABONATE (LOTEMAX) 0.5 % GEL    Apply 0.5 inches to eye 2 (two) times daily. Left eye   METOPROLOL SUCCINATE (TOPROL-XL) 25 MG 24 HR TABLET    Take 25 mg by mouth daily.   MULTIPLE VITAMINS-MINERALS (ICAPS PO)  Take 1 tablet by mouth daily.   NITROGLYCERIN (NITROSTAT) 0.4 MG SL TABLET    Place 0.4 mg under the tongue every 5 (five) minutes as needed for chest pain.    RIVAROXABAN (XARELTO) 15 MG TABS TABLET    Take 15 mg by mouth 2 (two) times daily with a meal.    SERTRALINE (ZOLOFT) 50 MG TABLET    Take 50 mg by mouth daily.   SIMVASTATIN (ZOCOR) 20 MG TABLET    Take 1 tablet (20 mg total) by mouth every other day.   TRIAMCINOLONE (KENALOG) 0.025 % CREAM    Apply 1 application topically 2 (two) times daily as needed (for itchy areas).   Modified Medications   No medications on file  Discontinued Medications   No medications on file      Physical Exam:  Filed Vitals:   11/08/15 1635  BP: 122/67  Pulse: 74  Temp: 98.3 F (36.8 C)  Resp: 18  SpO2: 97%    Physical Exam  Constitutional: No distress.  Skin: She is not diaphoretic.  Psychiatric: Affect normal.    Wt Readings from Last 3 Encounters:  11/07/15 140 lb (63.504 kg)  11/06/15 140 lb 12.8 oz (63.866 kg)  10/30/15 140 lb (63.504 kg)     Labs reviewed/Significant Diagnostic Results:  Basic Metabolic Panel:  Recent Labs  02/23/15 1419 07/12/15 11/07/15 1131  NA 139 140 139  K 5.0 4.6 4.3  CL 106  --  108  CO2 25  --  24  GLUCOSE 85  --  107*  BUN 23 23* 21*  CREATININE 0.80 0.8 0.88  CALCIUM 8.6  --  8.6*   Liver Function Tests:  Recent Labs  02/23/15 02/23/15 1419  AST 15 25  ALT 10 14  ALKPHOS 81 85  BILITOT  --  0.4  PROT  --  6.9  ALBUMIN  --  3.7   No results for input(s): LIPASE, AMYLASE in the last 8760 hours. No results for input(s): AMMONIA in the last 8760 hours. CBC:  Recent Labs  02/23/15 1419  02/23/15 1806  11/05/15 11/06/15 11/07/15 1131  WBC 4.5  --  9.0  < > 9.4 9.5 10.3  NEUTROABS  --   --   --   --   --   --  6.7  HGB 14.7  < > 7.4*  < > 6.8* 6.4* 7.2*  HCT 46.8*  < > 23.2*  < > 20* 20* 23.0*  MCV 88.3  --  87.5  --   --   --  92.0  PLT 232  --  439*  < > 420* 416* 445*  < > = values in this interval not displayed. CBG: No results for input(s): GLUCAP in the last 8760 hours. TSH:  Recent Labs  02/23/15  TSH 0.67   A1C: Lab Results  Component Value Date   HGBA1C 6.1* 02/01/2014   Lipid Panel:  Recent Labs  02/23/15  CHOL 145  HDL 45  LDLCALC 82  TRIG 141       Assessment/Plan  1. Anemia due to chronic blood loss -I recommended a blood transfusion but she declined due to the cost of transportation and she is in disbelief that she ever had a blood transfusion before(in March of 2016) or that she is anemic now. She states that she is not weak or dizzy. I let her know that her  decision has risk but that we would continue with the increased dose of  iron and then recheck her CBC next week  2. Superficial thrombosis of leg, right -I recommended a follow up doppler study, as did the hospital, to evaluate propagation of the clot. She declined further evaluation.  She is skeptical of her care and wonders if she has a clot at all. I let her know that its possible that she had a larger clot that reduced in size over time, or simply that the test was a false positive. We would still recommend follow up but she is not interested. I discussed the above with her POA, Manuela Schwartz, and she understands the risk involved.  We are still here to help Ms. Konicek anyway that we if she changes her mind.   Cindi Carbon, ANP [facility] [phone]

## 2015-11-13 ENCOUNTER — Non-Acute Institutional Stay (SKILLED_NURSING_FACILITY): Payer: Medicare Other | Admitting: Internal Medicine

## 2015-11-13 DIAGNOSIS — Z9289 Personal history of other medical treatment: Secondary | ICD-10-CM

## 2015-11-13 DIAGNOSIS — Z960 Presence of urogenital implants: Secondary | ICD-10-CM

## 2015-11-13 DIAGNOSIS — D500 Iron deficiency anemia secondary to blood loss (chronic): Secondary | ICD-10-CM | POA: Diagnosis not present

## 2015-11-13 DIAGNOSIS — I82811 Embolism and thrombosis of superficial veins of right lower extremities: Secondary | ICD-10-CM | POA: Diagnosis not present

## 2015-11-16 DIAGNOSIS — D649 Anemia, unspecified: Secondary | ICD-10-CM | POA: Diagnosis not present

## 2015-11-16 LAB — CBC AND DIFFERENTIAL
HEMATOCRIT: 20 % — AB (ref 36–46)
Hemoglobin: 6.5 g/dL — AB (ref 12.0–16.0)
PLATELETS: 445 10*3/uL — AB (ref 150–399)
WBC: 8.8 10^3/mL

## 2015-11-16 NOTE — Progress Notes (Signed)
Patient ID: Sonya [name], female   DOB: [birth year], 81 y.o.   MRN: [id number]  Location:  SNF Provider:  Jonelle Sidle L. Mariea Clonts, D.O., C.M.D. Hollace Kinnier, DO  Code Status:  DNR Goals of care: Advanced Directive information    Chief Complaint  Patient presents with  . Follow-up    on DVT/phlebitis and pessary    HPI:  Pt is a 81 y.o. white female seen today for f/u on her DVT and pessary.  When she went to IR and an ultrasound of her leg was repeated, no DVT was found.    She also no longer had any swelling, warmth or redness of the leg.  This has been explained to her and our concerns that the clot may have migrated.  IR recommended a f/u ultrasound, but she refused.  She is also quite anemic and refused to go out for a transfusion.    In terms of her pessary, I have made arrangements for [name], NP, from [facility], to come out and change her pessary and educate me on the process so we could do this here in the future since Ms. Deblanc cannot transfer to an exam table without a lift.  Review of Systems  Constitutional: Negative for fever and chills.  Eyes: Positive for blurred vision.  Respiratory: Negative for shortness of breath.   Gastrointestinal: Negative for abdominal pain.  Musculoskeletal: Negative for myalgias.       No leg pain, swelling, warmth  Neurological: Negative for dizziness and headaches.  Psychiatric/Behavioral: Positive for memory loss.       Very mild memory loss    Past Medical History  Diagnosis Date  . Acute bronchitis   . Asthma   . TIA (transient ischemic attack)   . Herpes zoster complication 123456    complicated, left eye involvement  . Nontoxic uninodular goiter     negative [place] 2008  . Macular degeneration (senile) of retina, unspecified 2012    wet  . Acute myocardial infarction, unspecified site, episode of care unspecified 03/07/11  . Coronary atherosclerosis of native coronary artery      s/p stent diagonal branch 1996; RCA  03/07/11  . Chronic diastolic heart failure ([place])   . Cardiomegaly   . Diaphragmatic hernia without mention of obstruction or gangrene   . Irritable bowel syndrome   . Other specified genital prolapse(618.89)     corrected with pessary  . Edema 2006  . Other abnormal blood chemistry   . Debility, unspecified   . Open wound of knee, leg (except thigh), and ankle, without mention of complication 0000000    left leg, complicated wound, extended healing, resolved 01/2013  . Transient ischemic attack (TIA), and cerebral infarction without residual deficits 2008  . Shoulder pain 03/30/2013  . Hypertension   . Depression   . Finger numbness 12/28/2013    Left thumb, 1, 2nd fingers  . Dysphagia S/P CVA (cerebrovascular accident) 02/14/2014  . CHF (congestive heart failure) ([place])   . Stroke ([place])   . Chronic diastolic heart failure ([place]) 01/31/2014    01/2014: 2d echo: EF 0000000, grade 1 diastolic dysfunction    Past Surgical History  Procedure Laterality Date  . Appendectomy    . Tonsillectomy and adenoidectomy    . Cataract extraction    . Cardiac catheterization  1996, 2001, 2012  . Carotid stent      diagonal branch of LCA    Allergies  Allergen Reactions  . Crestor [Rosuvastatin] Other (  See Comments)    Leg Weakness   . Ace Inhibitors Cough  . Plavix [Clopidogrel] Nausea And Vomiting      Medication List       This list is accurate as of: 11/13/15 11:59 PM.  Always use your most recent med list.               antiseptic oral rinse Liqd  15 mLs by Mouth Rinse route at bedtime.     DERMA-SMOOTHE/FS SCALP 0.01 % Oil  Apply 1 application topically 2 (two) times daily as needed (for scalp).     esomeprazole 40 MG capsule  Commonly known as:  NEXIUM  Take 40 mg by mouth daily.     ferrous sulfate 325 (65 FE) MG tablet  Take 325 mg by mouth 2 (two) times daily with a meal.     furosemide 20 MG tablet  Commonly known as:  LASIX  Take 20 mg by mouth every other day.      ICAPS PO  Take 1 tablet by mouth daily.     losartan 50 MG tablet  Commonly known as:  COZAAR  Take 50 mg by mouth daily.     LOTEMAX 0.5 % Gel  Generic drug:  Loteprednol Etabonate  Apply 0.5 inches to eye 2 (two) times daily. Left eye     metoprolol succinate 25 MG 24 hr tablet  Commonly known as:  TOPROL-XL  Take 25 mg by mouth daily.     nitroGLYCERIN 0.4 MG SL tablet  Commonly known as:  NITROSTAT  Place 0.4 mg under the tongue every 5 (five) minutes as needed for chest pain.     OPTIVE 0.5-0.9 % Soln  Generic drug:  Carboxymethylcellul-Glycerin  Place 2 drops into the right eye every 2 (two) hours as needed (dry eyes).     Rivaroxaban 15 MG Tabs tablet  Commonly known as:  XARELTO  Take 15 mg by mouth 2 (two) times daily with a meal.     sertraline 50 MG tablet  Commonly known as:  ZOLOFT  Take 50 mg by mouth daily.     simvastatin 20 MG tablet  Commonly known as:  ZOCOR  Take 1 tablet (20 mg total) by mouth every other day.     triamcinolone 0.025 % cream  Commonly known as:  KENALOG  Apply 1 application topically 2 (two) times daily as needed (for itchy areas).        Immunization History  Administered Date(s) Administered  . Influenza Whole 09/26/2013  . Influenza-Unspecified 09/26/2014, 10/04/2015  . Pneumococcal Polysaccharide-23 12/22/2008  . Tdap 10/13/2012   Pertinent  Health Maintenance Due  Topic Date Due  . DEXA SCAN  10/25/1980  . PNA vac Low Risk Adult (2 of 2 - PCV13) 12/22/2009  . INFLUENZA VACCINE  07/22/2016   Fall Risk  10/08/2015 09/28/2013  Falls in the past year? No No  Risk for fall due to : History of fall(s) History of fall(s)  Risk for fall due to (comments): - 10/13 fell out of bed    Filed Vitals:   11/13/15 1432  BP: 122/67  Pulse: 74  Temp: 98.3 F (36.8 C)  Resp: 18  Height: 4' 11.3" (1.506 m)  Weight: 141 lb 12.8 oz (64.32 kg)  SpO2: 97%   Body mass index is 28.36 kg/(m^2). Physical Exam  Constitutional: She  is oriented to person, place, and time. She appears well-developed and well-nourished. No distress.  Cardiovascular: Normal rate, regular rhythm, normal  heart sounds and intact distal pulses.   Pulmonary/Chest: Effort normal and breath sounds normal. No respiratory distress.  Abdominal: Soft. Bowel sounds are normal.  Musculoskeletal: Normal range of motion.  Neurological: She is alert and oriented to person, place, and time.  Skin: Skin is warm and dry. There is pallor.  Psychiatric: She has a normal mood and affect.    Labs reviewed:  Recent Labs  02/23/15 1419 07/12/15 11/07/15 1131  NA 139 140 139  K 5.0 4.6 4.3  CL 106  --  108  CO2 25  --  24  GLUCOSE 85  --  107*  BUN 23 23* 21*  CREATININE 0.80 0.8 0.88  CALCIUM 8.6  --  8.6*    Recent Labs  02/23/15 02/23/15 1419  AST 15 25  ALT 10 14  ALKPHOS 81 85  BILITOT  --  0.4  PROT  --  6.9  ALBUMIN  --  3.7    Recent Labs  02/23/15 1419  02/23/15 1806  11/05/15 11/06/15 11/07/15 1131  WBC 4.5  --  9.0  < > 9.4 9.5 10.3  NEUTROABS  --   --   --   --   --   --  6.7  HGB 14.7  < > 7.4*  < > 6.8* 6.4* 7.2*  HCT 46.8*  < > 23.2*  < > 20* 20* 23.0*  MCV 88.3  --  87.5  --   --   --  92.0  PLT 232  --  439*  < > 420* 416* 445*  < > = values in this interval not displayed. Lab Results  Component Value Date   TSH 0.67 02/23/2015   Lab Results  Component Value Date   HGBA1C 6.1* 02/01/2014   Lab Results  Component Value Date   CHOL 145 02/23/2015   HDL 45 02/23/2015   LDLCALC 82 02/23/2015   TRIG 141 02/23/2015   CHOLHDL 3.4 02/01/2014    Significant Diagnostic Results in last 30 days:  11/07/15:  Vascular US of lower extremity venous (DVT) right: -No evidence of deep vein thrombosis involving the right lower  extremity and left common femoral vein. - Findings consistent with acute superficial vein thrombosis  involving the right small saphenous vein. - No evidence of Baker&'s cyst on the  right.  Assessment/Plan 1. Superficial thrombosis of leg, right -seems this has all resolved with xarelto  2. Anemia due to chronic blood loss -had heme positive stools while on the xarelto so it was stopped with plans for IVC filter to prevent PE from DVT, but ultrasound at IR was negative -remains off xarelto now -f/u h/h next routine visit to monitor and begin iron -refuses transfusion (mostly b/c of transportation)  3. Vaginal pessary present -for cleaning and replacing next week and every 6 mos thereafter  Family/ staff Communication: discussed with nursing staff on SNF and nurse manager  Labs/tests ordered:  No new today Corrie Reder L. Carnella Fryman, D.O. [facility] [address], [place] [address] Cell Phone (Mon-Fri 8am-5pm):  [phone] On Call:  [phone] & follow prompts after 5pm & weekends Office Phone:  [phone] Office Fax:  [phone]

## 2015-11-21 DIAGNOSIS — N8182 Incompetence or weakening of pubocervical tissue: Secondary | ICD-10-CM | POA: Diagnosis not present

## 2015-11-27 ENCOUNTER — Encounter: Payer: Self-pay | Admitting: Internal Medicine

## 2015-11-27 IMAGING — CT CT HEAD W/O CM
1 series · 16 of 30 positions shown, 20 images · non-contrast
Comparison: 09/14/2007

CLINICAL DATA: Slurred speech, aphasia

EXAM:
CT HEAD WITHOUT CONTRAST
TECHNIQUE: Contiguous axial images were obtained from the base of the skull
through the vertex without intravenous contrast.

[Series 2: head 5.0 h30s · axial · 0.43mm/px · z∈[-94,+41]mm · 16 of 31 slices shown, 20 images]
[im 2/31  brain]
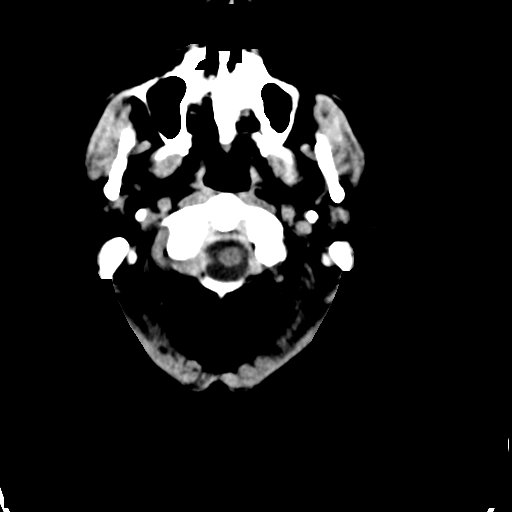
[im 2/31  bone]
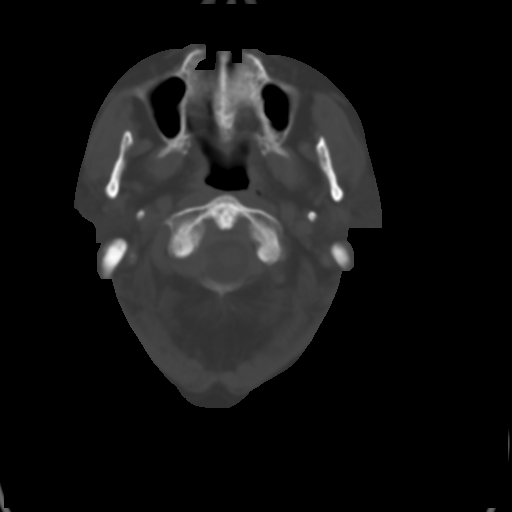
[im 4/31  brain]
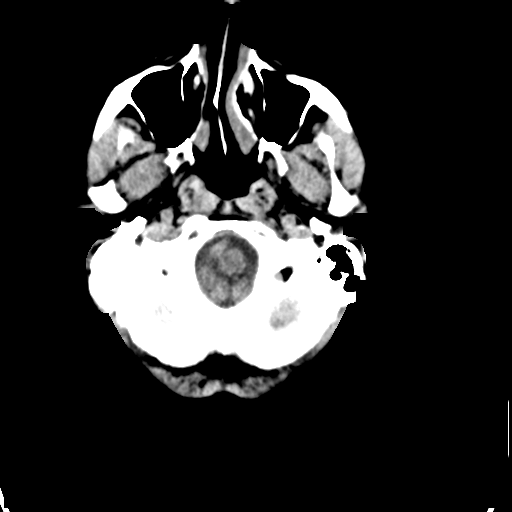
[im 6/31  brain]
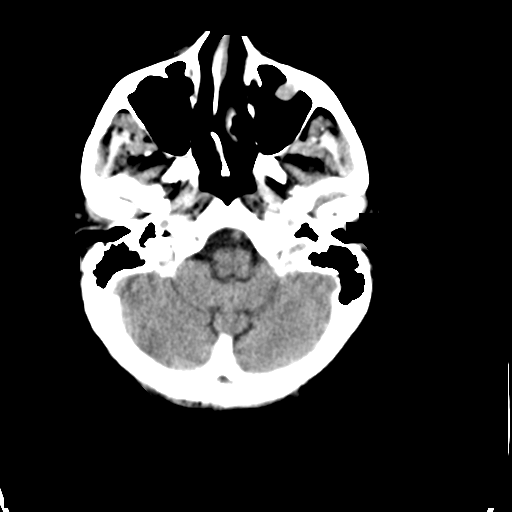
[im 8/31  brain]
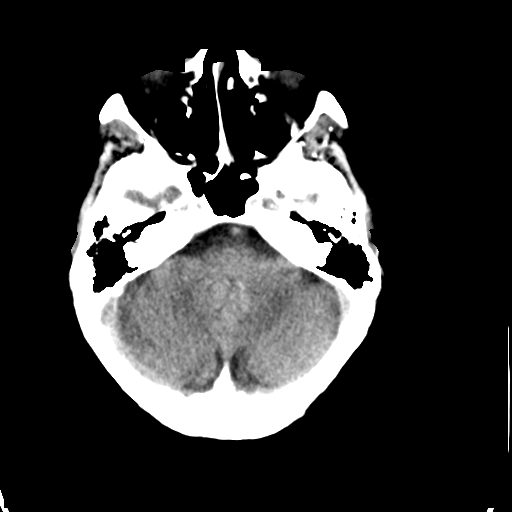
[im 9/31  brain]
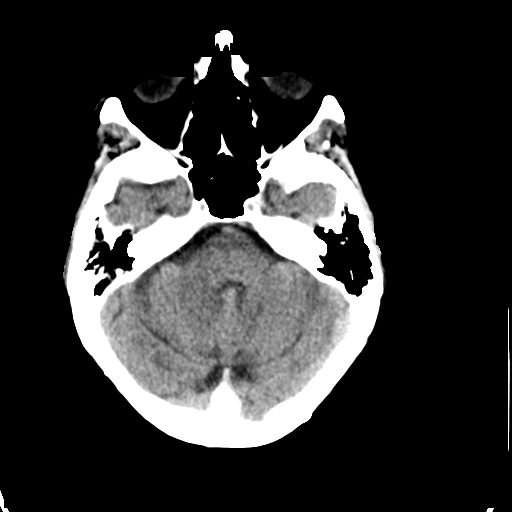
[im 9/31  bone]
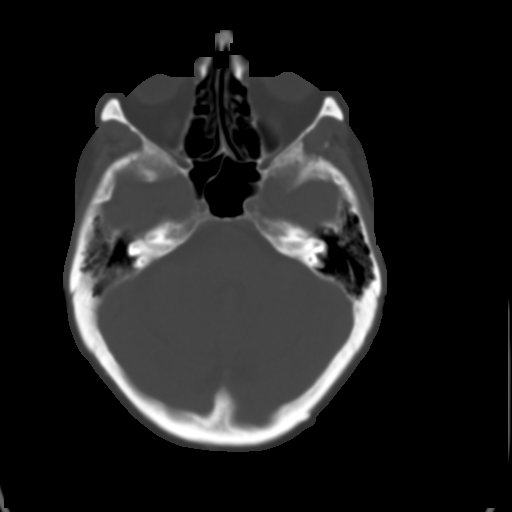
[im 11/31  brain]
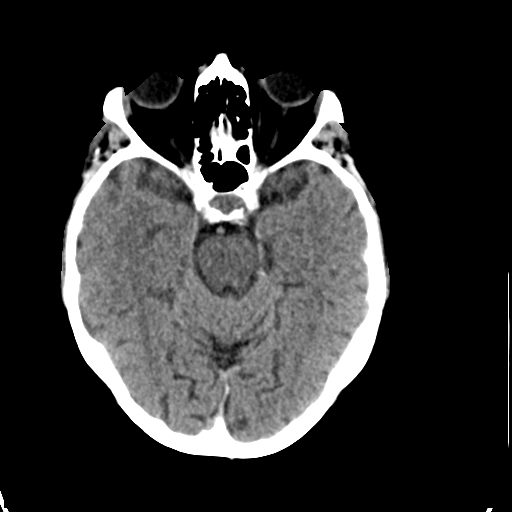
[im 13/31  brain]
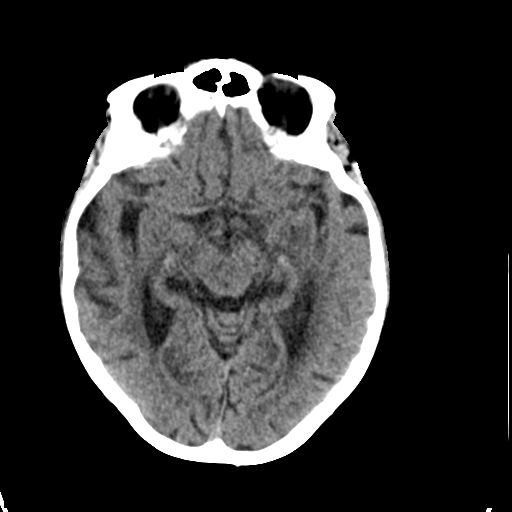
[im 15/31  brain]
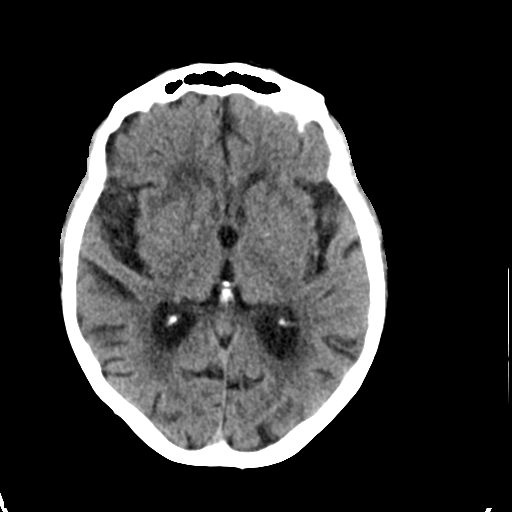
[im 16/31  brain]
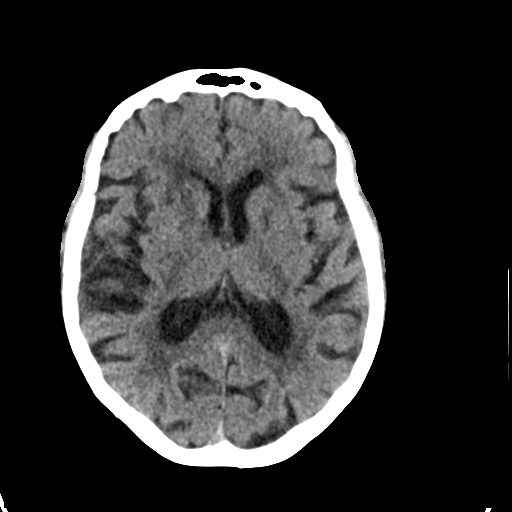
[im 16/31  bone]
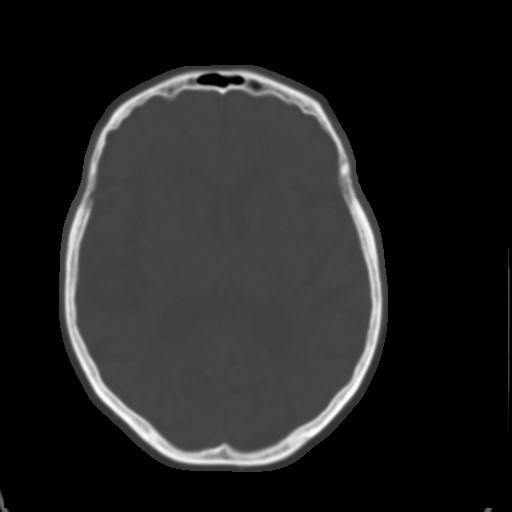
[im 18/31  brain]
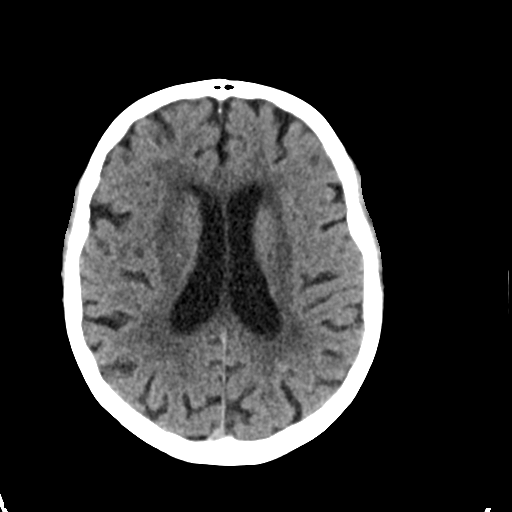
[im 20/31  brain]
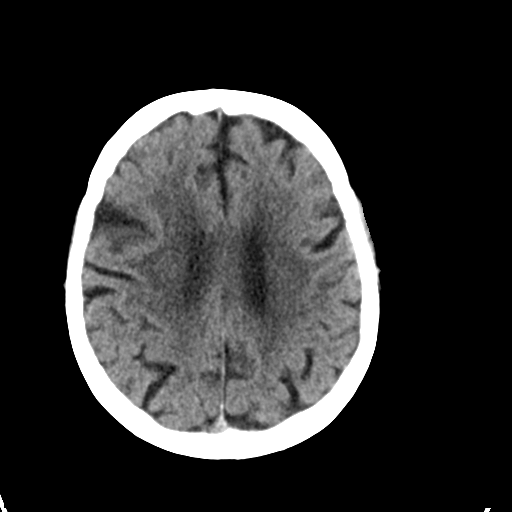
[im 22/31  brain]
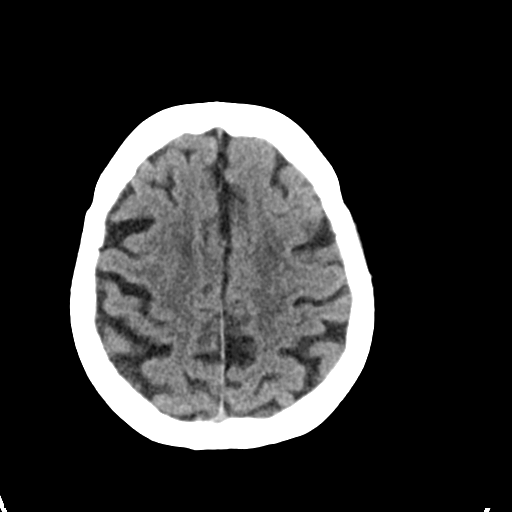
[im 23/31  brain]
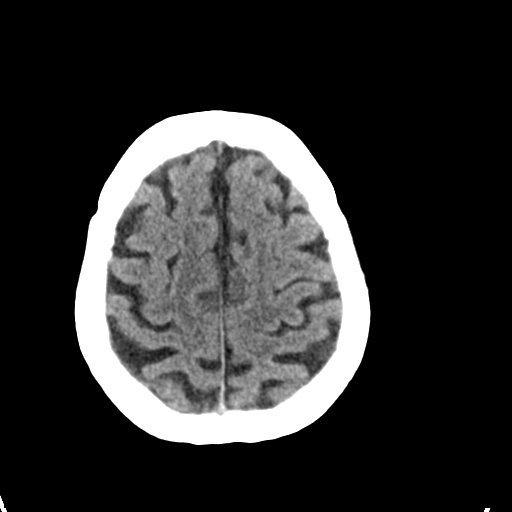
[im 23/31  bone]
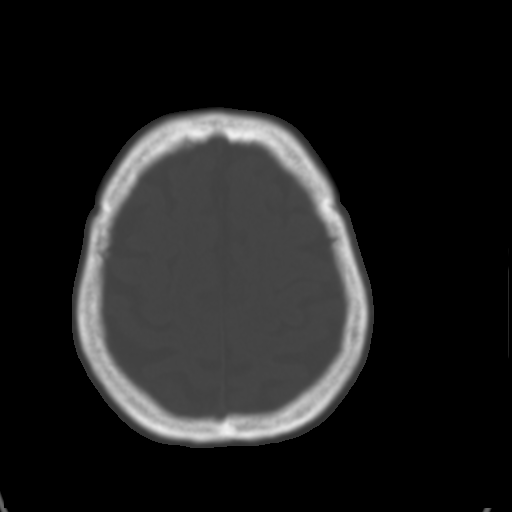
[im 25/31  brain]
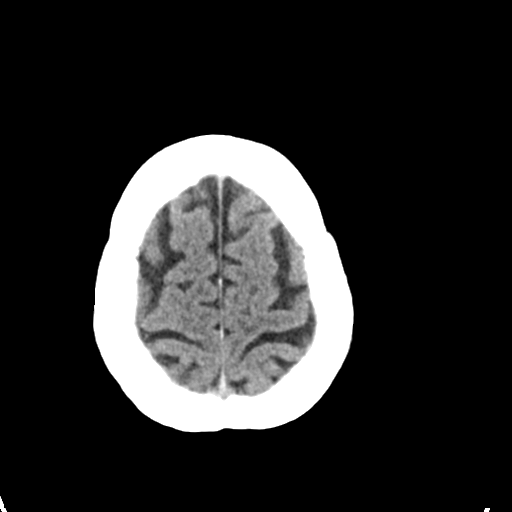
[im 27/31  brain]
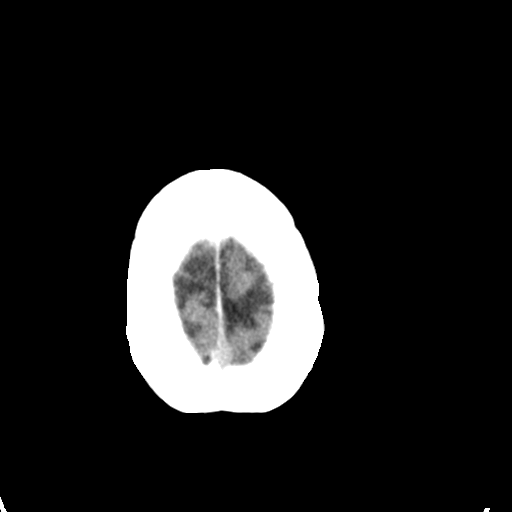
[im 29/31  brain]
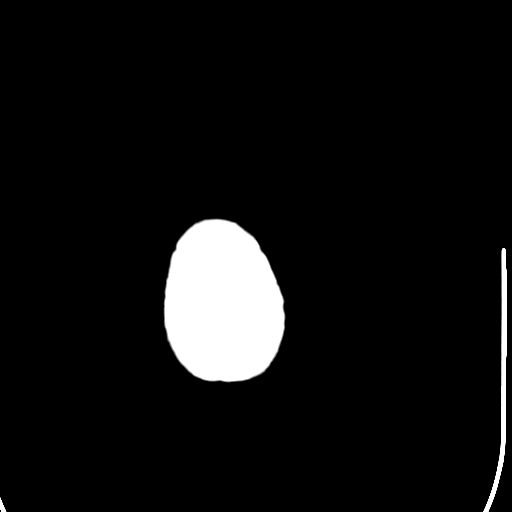

[16 of 30 positions shown; findings below may reference images not displayed]

FINDINGS: No skull fracture is noted. Paranasal sinuses shows probable mucous
retention cyst anterior aspect of left maxillary sinus measures
cm. The mastoid air cells are unremarkable.

No definite acute cortical infarction. Periventricular and patchy
subcortical white matter decreased attenuation probable due to
chronic small vessel ischemic changes. No mass lesion is noted on
this unenhanced scan. No intracranial hemorrhage, mass effect or
midline shift.
IMPRESSION: No definite acute cortical infarction. Periventricular and patchy
subcortical white matter decreased attenuation probable due to
chronic small vessel ischemic changes. No mass lesion is noted on
this unenhanced scan. No intracranial hemorrhage, mass effect or
midline shift.

## 2015-11-29 DIAGNOSIS — H04123 Dry eye syndrome of bilateral lacrimal glands: Secondary | ICD-10-CM | POA: Diagnosis not present

## 2015-11-29 DIAGNOSIS — H18422 Band keratopathy, left eye: Secondary | ICD-10-CM | POA: Diagnosis not present

## 2015-11-29 DIAGNOSIS — H04129 Dry eye syndrome of unspecified lacrimal gland: Secondary | ICD-10-CM | POA: Insufficient documentation

## 2015-11-29 DIAGNOSIS — Z961 Presence of intraocular lens: Secondary | ICD-10-CM | POA: Diagnosis not present

## 2015-11-29 DIAGNOSIS — H353212 Exudative age-related macular degeneration, right eye, with inactive choroidal neovascularization: Secondary | ICD-10-CM | POA: Diagnosis not present

## 2015-11-30 ENCOUNTER — Encounter: Payer: Self-pay | Admitting: Adult Health

## 2015-11-30 ENCOUNTER — Non-Acute Institutional Stay (SKILLED_NURSING_FACILITY): Payer: Medicare Other | Admitting: Adult Health

## 2015-11-30 DIAGNOSIS — F329 Major depressive disorder, single episode, unspecified: Secondary | ICD-10-CM | POA: Diagnosis not present

## 2015-11-30 DIAGNOSIS — F32A Depression, unspecified: Secondary | ICD-10-CM

## 2015-11-30 DIAGNOSIS — D500 Iron deficiency anemia secondary to blood loss (chronic): Secondary | ICD-10-CM | POA: Diagnosis not present

## 2015-11-30 DIAGNOSIS — I82811 Embolism and thrombosis of superficial veins of right lower extremities: Secondary | ICD-10-CM

## 2015-11-30 NOTE — Progress Notes (Signed)
Patient ID: Sonya [name], female   DOB: Apr 23, 1913, 81 y.o.   MRN: [id number]     Nursing Home Location:  [place]   Code Status: DNR  Patient Care Team: Gayland Curry, DO as PCP - General (Geriatric Medicine) [facility] Jerline Pain, MD as Consulting Physician (Cardiology) [name], NP as Nurse Practitioner (Obstetrics and Gynecology)   Place of Service: SNF (31)  Chief Complaint  Patient presents with  . Acute Visit    gi upset  . Medical Management of Chronic Issues    HPI:  81 y.o. female residing at [facility], skilled care section.   She reported swelling and tenderness of the RLE and a venous doppler was performed on 11/02/15 showing an extensive dvt in the popliteal and femoral area.  Another doppler study was performed on the right leg again prior to the IVC filter procedure, only showing a superficial saphenous clot. At that point the procedure was cancelled and she was ordered to return to the hospital in 1 week but she refused. Since then she has not had any leg or chest pain, increased swelling, or SOB.   She has a hx of heme pos stool with blood transfusion in March of 2016. No further work up was performed due to her age and debility. She has not had any overt bleeding but her Hgb has remained in the 6.5 range. She has been on iron BID and is now reporting flatulence and fecal incontinence.  . .  She was started on Zoloft in October and has significant improvement. She is pleasant with the staff and generally more interactive and agreeable.  Review of Systems:  Review of Systems  Constitutional: Negative for fever, activity change and appetite change.  Respiratory: Negative for cough and shortness of breath.   Cardiovascular: Negative for chest pain, palpitations and leg swelling.  Gastrointestinal: Negative for nausea, vomiting, abdominal pain, constipation, blood in stool, abdominal  distention, anal bleeding and rectal pain.       Flatulence and fecal incontinence  Genitourinary: Positive for flank pain.  Neurological: Positive for facial asymmetry (h/o cva). Negative for tremors, syncope and weakness.  Hematological: Negative for adenopathy. Does not bruise/bleed easily.  Psychiatric/Behavioral: Negative for behavioral problems, confusion and agitation.    Medications: Patient's Medications  New Prescriptions   No medications on file  Previous Medications   ANTISEPTIC ORAL RINSE (BIOTENE) LIQD    15 mLs by Mouth Rinse route at bedtime.   CARBOXYMETHYLCELLUL-GLYCERIN (OPTIVE) 0.5-0.9 % SOLN    Place 2 drops into the right eye every 2 (two) hours as needed (dry eyes).   ESOMEPRAZOLE (NEXIUM) 40 MG CAPSULE    Take 40 mg by mouth daily.    FERROUS SULFATE 325 (65 FE) MG TABLET    Take 325 mg by mouth daily with breakfast.    FLUOCINOLONE ACETONIDE (DERMA-SMOOTHE/FS SCALP) 0.01 % OIL    Apply 1 application topically 2 (two) times daily as needed (for scalp).   FUROSEMIDE (LASIX) 20 MG TABLET    Take 20 mg by mouth every other day.    LOSARTAN (COZAAR) 50 MG TABLET    Take 50 mg by mouth daily.   LOTEPREDNOL ETABONATE (LOTEMAX) 0.5 % GEL    Apply 0.5 inches to eye 2 (two) times daily. Left eye   METOPROLOL SUCCINATE (TOPROL-XL) 25 MG 24 HR TABLET    Take 25 mg by mouth daily.   MULTIPLE VITAMINS-MINERALS (ICAPS PO)  Take 1 tablet by mouth daily.   NITROGLYCERIN (NITROSTAT) 0.4 MG SL TABLET    Place 0.4 mg under the tongue every 5 (five) minutes as needed for chest pain.    RIVAROXABAN (XARELTO) 15 MG TABS TABLET    Take 15 mg by mouth 2 (two) times daily with a meal.    SERTRALINE (ZOLOFT) 50 MG TABLET    Take 50 mg by mouth daily.   SIMVASTATIN (ZOCOR) 20 MG TABLET    Take 1 tablet (20 mg total) by mouth every other day.   TRIAMCINOLONE (KENALOG) 0.025 % CREAM    Apply 1 application topically 2 (two) times daily as needed (for itchy areas).   Modified Medications   No  medications on file  Discontinued Medications   No medications on file     Physical Exam:  There were no vitals filed for this visit.  Physical Exam  Constitutional: She is oriented to person, place, and time. No distress.  Cardiovascular: Normal rate and regular rhythm.   No murmur heard. +1 edema in both lower ext, mild erythema on the right, no tenderness  Pulmonary/Chest: Effort normal and breath sounds normal. No respiratory distress.  Abdominal: Soft. Bowel sounds are normal. She exhibits no distension. There is no tenderness.  Neurological: She is alert and oriented to person, place, and time.  Skin: Skin is warm and dry. She is not diaphoretic.  Psychiatric: Affect normal.    Wt Readings from Last 3 Encounters:  11/13/15 141 lb 12.8 oz (64.32 kg)  11/07/15 140 lb (63.504 kg)  11/06/15 140 lb 12.8 oz (63.866 kg)     Labs reviewed/Significant Diagnostic Results:  Basic Metabolic Panel:  Recent Labs  02/23/15 1419 07/12/15 11/07/15 1131  NA 139 140 139  K 5.0 4.6 4.3  CL 106  --  108  CO2 25  --  24  GLUCOSE 85  --  107*  BUN 23 23* 21*  CREATININE 0.80 0.8 0.88  CALCIUM 8.6  --  8.6*   Liver Function Tests:  Recent Labs  02/23/15 02/23/15 1419  AST 15 25  ALT 10 14  ALKPHOS 81 85  BILITOT  --  0.4  PROT  --  6.9  ALBUMIN  --  3.7   No results for input(s): LIPASE, AMYLASE in the last 8760 hours. No results for input(s): AMMONIA in the last 8760 hours. CBC:  Recent Labs  02/23/15 1419  02/23/15 1806  11/06/15 11/07/15 1131 11/16/15  WBC 4.5  --  9.0  < > 9.5 10.3 8.8  NEUTROABS  --   --   --   --   --  6.7  --   HGB 14.7  < > 7.4*  < > 6.4* 7.2* 6.5*  HCT 46.8*  < > 23.2*  < > 20* 23.0* 20*  MCV 88.3  --  87.5  --   --  92.0  --   PLT 232  --  439*  < > 416* 445* 445*  < > = values in this interval not displayed. CBG: No results for input(s): GLUCAP in the last 8760 hours. TSH:  Recent Labs  02/23/15  TSH 0.67   A1C: Lab Results    Component Value Date   HGBA1C 6.1* 02/01/2014   Lipid Panel:  Recent Labs  02/23/15  CHOL 145  HDL 45  LDLCALC 82  TRIG 141       Assessment/Plan  1. Anemia due to chronic blood loss -check CBC next  draw -decrease iron to qd (due to gi upest) and given with orange juice if tolerated -no aggressive work up due to age -resident declines blood transfusion  2. Depression -significant improvement -continue zoloft  3. Superficial saphenous clot -no further pain or complication -resident decline follow up doppler study -continue to monitor     Cindi Carbon, [place] [phone]

## 2015-12-04 DIAGNOSIS — D649 Anemia, unspecified: Secondary | ICD-10-CM | POA: Diagnosis not present

## 2015-12-04 NOTE — Progress Notes (Signed)
This encounter was created in error - please disregard.

## 2015-12-26 DIAGNOSIS — N8189 Other female genital prolapse: Secondary | ICD-10-CM | POA: Diagnosis not present

## 2016-01-15 ENCOUNTER — Non-Acute Institutional Stay (SKILLED_NURSING_FACILITY): Payer: Medicare Other | Admitting: Internal Medicine

## 2016-01-15 ENCOUNTER — Encounter: Payer: Self-pay | Admitting: Internal Medicine

## 2016-01-15 DIAGNOSIS — R197 Diarrhea, unspecified: Secondary | ICD-10-CM | POA: Diagnosis not present

## 2016-01-15 DIAGNOSIS — F32A Depression, unspecified: Secondary | ICD-10-CM

## 2016-01-15 DIAGNOSIS — F329 Major depressive disorder, single episode, unspecified: Secondary | ICD-10-CM

## 2016-01-15 DIAGNOSIS — D500 Iron deficiency anemia secondary to blood loss (chronic): Secondary | ICD-10-CM | POA: Diagnosis not present

## 2016-01-17 DIAGNOSIS — D649 Anemia, unspecified: Secondary | ICD-10-CM | POA: Diagnosis not present

## 2016-01-17 DIAGNOSIS — K319 Disease of stomach and duodenum, unspecified: Secondary | ICD-10-CM | POA: Diagnosis not present

## 2016-01-18 ENCOUNTER — Telehealth: Payer: Self-pay | Admitting: Cardiology

## 2016-01-18 ENCOUNTER — Non-Acute Institutional Stay (SKILLED_NURSING_FACILITY): Payer: Medicare Other | Admitting: Adult Health

## 2016-01-18 ENCOUNTER — Encounter: Payer: Self-pay | Admitting: Adult Health

## 2016-01-18 DIAGNOSIS — D500 Iron deficiency anemia secondary to blood loss (chronic): Secondary | ICD-10-CM | POA: Diagnosis not present

## 2016-01-18 DIAGNOSIS — R002 Palpitations: Secondary | ICD-10-CM

## 2016-01-18 LAB — CBC AND DIFFERENTIAL
HCT: 21 % — AB (ref 36–46)
Hemoglobin: 6.6 g/dL — AB (ref 12.0–16.0)
Platelets: 413 10*3/uL — AB (ref 150–399)
WBC: 8.9 10*3/mL

## 2016-01-18 NOTE — Telephone Encounter (Signed)
Spoke with Sam he reports pt c/o her heart "fluttering" earlier today.  They did an EKG and he states it showed NSR with a rate of 104 bpm with a BP of 140/80 and 02 sat of 97% on RA.  He will fax the EKG over to be reviewed by Dr Marlou Porch.  Same is aware Dr Marlou Porch is not in the office today but will be in on Monday.  Of note, pt's hemoglobin today is 6.6 and she is refusing a blood transfusion according to the documentation reviewed in EP[facility].  Will forward information to Dr Marlou Porch for his knowledge and to look for EKG on Monday.

## 2016-01-18 NOTE — Telephone Encounter (Signed)
New message   Patient c/o Palpitations:  High priority if patient c/o lightheadedness and shortness of breath.  1. How long have you been having palpitations? Today    2. Are you currently experiencing lightheadedness and shortness of breath? No   3. Have you checked your BP and heart rate? (document readings)  140/80 ,  104   4. Are you experiencing any other symptoms? No

## 2016-01-18 NOTE — Progress Notes (Signed)
Patient ID: Sonya [name], female   DOB: 02-02-1913, 82 y.o.   MRN: [id number]      Nursing Home Location:    [facility]  Code Status: DNR  Patient Care Team: Gayland Curry, DO as PCP - General (Geriatric Medicine) [facility] Jerline Pain, MD as Consulting Physician (Cardiology) [name], NP as Nurse Practitioner (Obstetrics and Gynecology)   Place of Service:  SNF  Chief Complaint  Patient presents with  . Acute Visit    palpitations and anemia    HPI:  82 y.o. female residing at [facility], skilled care section. I was asked to see her due to reports of palpitations with a HR of 106.  She denied CP or SOB at that time. A 12 lead EKG was obtained showing some st segment depression in the inferior leads. By the time the EKG was obtained her symptoms resolved and her HR was 88. She has a hx of heme pos stool with blood transfusion in March of 2016. No further work up was performed due to her age and debility. She has not had any overt bleeding. She has been on iron BID initially but had difficulty tolerating it due to GI s/e. Currently she is on Nu IRON 1 cap qd.  Her Hgb on 1/26 was 6.6, down from 7.3.  She denies increased weakness, fatigue, dizziness, or bloody stools. She has been counseled several times on receiving a blood transfusion but has declined. She was previously on antiplatelet therapy due to her hx of CVA, carotid stent, and CAD. This was discontinued in 2016 due to low Hgb.  Review of Systems:  Review of Systems  Constitutional: Negative for fever, chills, diaphoresis, activity change, appetite change, fatigue and unexpected weight change.  HENT: Negative for congestion.   Respiratory: Negative for cough, shortness of breath and wheezing.   Cardiovascular: Positive for palpitations. Negative for chest pain and leg swelling.  Gastrointestinal: Negative for abdominal pain, diarrhea, constipation,  blood in stool, abdominal distention and anal bleeding.       Frequent flatulence, Heme pos  Genitourinary: Negative for dysuria.  Musculoskeletal: Positive for arthralgias and gait problem. Negative for back pain.  Neurological: Positive for facial asymmetry and speech difficulty. Negative for dizziness.       H/o CVA  Psychiatric/Behavioral: Positive for behavioral problems and dysphoric mood.    Medications: Patient's Medications  New Prescriptions   No medications on file  Previous Medications   ANTISEPTIC ORAL RINSE (BIOTENE) LIQD    15 mLs by Mouth Rinse route at bedtime.   CARBOXYMETHYLCELLUL-GLYCERIN (OPTIVE) 0.5-0.9 % SOLN    Place 1 drop into the right eye. Three times daily   ESOMEPRAZOLE (NEXIUM) 40 MG CAPSULE    Take 40 mg by mouth daily.    FLUOCINOLONE ACETONIDE (DERMA-SMOOTHE/FS SCALP) 0.01 % OIL    Apply 1 application topically 2 (two) times daily as needed (for scalp).   FUROSEMIDE (LASIX) 20 MG TABLET    Take 20 mg by mouth every other day.    IRON POLYSACCHARIDES (NIFEREX) 150 MG CAPSULE    Take 150 mg by mouth. One capsule each morning   LOSARTAN (COZAAR) 50 MG TABLET    Take 50 mg by mouth daily.   LOTEPREDNOL ETABONATE (LOTEMAX) 0.5 % GEL    Apply 0.5 inches to eye 2 (two) times daily. Left eye   METOPROLOL SUCCINATE (TOPROL-XL) 25 MG 24 HR TABLET    Take 25 mg by  mouth daily.   MULTIPLE VITAMINS-MINERALS (ICAPS PO)    Take 1 tablet by mouth. Take one tablet twice a day for eyes   NITROGLYCERIN (NITROSTAT) 0.4 MG SL TABLET    Place 0.4 mg under the tongue every 5 (five) minutes as needed for chest pain.    SERTRALINE (ZOLOFT) 50 MG TABLET    Take 50 mg by mouth daily.   SIMVASTATIN (ZOCOR) 20 MG TABLET    Take 1 tablet (20 mg total) by mouth every other day.  Modified Medications   No medications on file  Discontinued Medications   No medications on file     Physical Exam:  There were no vitals filed for this visit.  Physical Exam  Constitutional: She is  oriented to person, place, and time. No distress.  Neck: No JVD present.  Cardiovascular: Normal rate and regular rhythm.   No murmur heard. Trace edema bilat  Pulmonary/Chest: Effort normal and breath sounds normal. No respiratory distress.  Abdominal: Soft. Bowel sounds are normal. She exhibits no distension. There is no tenderness.  Neurological: She is alert and oriented to person, place, and time.  Skin: Skin is warm and dry. She is not diaphoretic.  Psychiatric: Affect normal.    Wt Readings from Last 3 Encounters:  01/15/16 141 lb (63.957 kg)  11/13/15 141 lb 12.8 oz (64.32 kg)  11/07/15 140 lb (63.504 kg)     Labs reviewed/Significant Diagnostic Results:  Basic Metabolic Panel:  Recent Labs  02/23/15 1419 07/12/15 11/07/15 1131  NA 139 140 139  K 5.0 4.6 4.3  CL 106  --  108  CO2 25  --  24  GLUCOSE 85  --  107*  BUN 23 23* 21*  CREATININE 0.80 0.8 0.88  CALCIUM 8.6  --  8.6*   Liver Function Tests:  Recent Labs  02/23/15 02/23/15 1419  AST 15 25  ALT 10 14  ALKPHOS 81 85  BILITOT  --  0.4  PROT  --  6.9  ALBUMIN  --  3.7   No results for input(s): LIPASE, AMYLASE in the last 8760 hours. No results for input(s): AMMONIA in the last 8760 hours. CBC:  Recent Labs  02/23/15 1419  02/23/15 1806  11/07/15 1131 11/16/15 01/18/16  WBC 4.5  --  9.0  < > 10.3 8.8 8.9  NEUTROABS  --   --   --   --  6.7  --   --   HGB 14.7  < > 7.4*  < > 7.2* 6.5* 6.6*  HCT 46.8*  < > 23.2*  < > 23.0* 20* 21*  MCV 88.3  --  87.5  --  92.0  --   --   PLT 232  --  439*  < > 445* 445* 413*  < > = values in this interval not displayed. CBG: No results for input(s): GLUCAP in the last 8760 hours. TSH:  Recent Labs  02/23/15  TSH 0.67   A1C: Lab Results  Component Value Date   HGBA1C 6.1* 02/01/2014   Lipid Panel:  Recent Labs  02/23/15  CHOL 145  HDL 45  LDLCALC 82  TRIG 141       Assessment/Plan   1. Anemia due to chronic blood loss -I recommended  a blood transfusion to prevent complications such as palpitations or cardiac ischemia.  She declined because she said that the palpitations went away and she never had them before. She has MCI but is her own POA. I  would not want her to have any aggressive medical measures taken given her age but I do think its reasonable to give her a transfusion to improve her well being and prevent complications. She would like Dr. Marlou Porch input.  2. Palpitations -resolved, some ischemia on EKG but no symptoms -given her age would not change her plan of care except the recommendations from #1 -call in to Dr. Marlou Porch office    Cindi Carbon, [place] [phone]

## 2016-01-25 ENCOUNTER — Ambulatory Visit (HOSPITAL_COMMUNITY)
Admission: RE | Admit: 2016-01-25 | Discharge: 2016-01-25 | Disposition: A | Discharge status: Home / Self Care | Payer: Medicare Other | Source: Ambulatory Visit | Attending: Adult Health | Admitting: Adult Health

## 2016-01-25 DIAGNOSIS — D500 Iron deficiency anemia secondary to blood loss (chronic): Secondary | ICD-10-CM | POA: Diagnosis not present

## 2016-01-25 LAB — PREPARE RBC (CROSSMATCH)

## 2016-01-25 MED ORDER — ACETAMINOPHEN 325 MG PO TABS
650.0000 mg | ORAL_TABLET | Freq: Once | ORAL | Status: DC
  Filled 2016-01-25: qty 2

## 2016-01-25 MED ORDER — FUROSEMIDE 10 MG/ML IJ SOLN
20.0000 mg | Freq: Once | INTRAMUSCULAR | Status: AC
  Administered 2016-01-25: 20 mg via INTRAVENOUS
  Filled 2016-01-25: qty 2

## 2016-01-25 MED ORDER — SODIUM CHLORIDE 0.9 % IV SOLN: Freq: Once | INTRAVENOUS | Status: DC

## 2016-01-27 LAB — TYPE AND SCREEN
ABO/RH(D): O POS
ANTIBODY SCREEN: NEGATIVE
UNIT DIVISION: 0
Unit division: 0

## 2016-01-31 ENCOUNTER — Encounter: Payer: Self-pay | Admitting: Cardiology

## 2016-01-31 DIAGNOSIS — D649 Anemia, unspecified: Secondary | ICD-10-CM | POA: Diagnosis not present

## 2016-02-03 NOTE — Progress Notes (Signed)
Patient ID: Sonya [name], female   DOB: June 05, 1913, 82 y.o.   MRN: [id number]  Location:  [facility] Provider:  Rexene Edison. Arrayah Connors, D.O., C.M.D. Hollace Kinnier, DO  Code Status:  DNR Goals of care: Advanced Directive information Does patient have an advance directive?: Yes, Type of Advance Directive: [place];Living will;Out of facility DNR (pink MOST or yellow form), Pre-existing out of facility DNR order (yellow form or pink MOST form): Yellow form placed in chart (order not valid for inpatient use)  Chief Complaint  Patient presents with  . Acute Visit    multiple stools per day for  2 weeks     HPI:  Pt is an 82 y.o. white female seen today for an acute visit due to multiple stools per day.  She says this is going on for two weeks and stools are on the loose side.  She is not taking any laxatives.  She does admit to eating chocolates which may have played a role.  She is on iron supplements.  She needs a f/u of her cbc but more than likely will still need iron considering how low it was.    Review of Systems  Constitutional: Positive for malaise/fatigue.  HENT: Positive for hearing loss.   Eyes: Positive for blurred vision.  Respiratory: Negative for shortness of breath.   Cardiovascular: Negative for chest pain and palpitations.  Gastrointestinal: Positive for diarrhea. Negative for heartburn, nausea, vomiting, abdominal pain, constipation, blood in stool and melena.  Genitourinary: Negative for dysuria.  Musculoskeletal: Negative for falls.  Neurological: Positive for weakness. Negative for dizziness and loss of consciousness.  Endo/Heme/Allergies: Bruises/bleeds easily.  Psychiatric/Behavioral: Positive for depression and memory loss.    Past Medical History  Diagnosis Date  . Acute bronchitis   . Asthma   . TIA (transient ischemic attack)   . Herpes zoster complication 123456    complicated, left eye involvement  . Nontoxic uninodular goiter    negative [place] 2008  . Macular degeneration (senile) of retina, unspecified 2012    wet  . Acute myocardial infarction, unspecified site, episode of care unspecified 03/07/11  . Coronary atherosclerosis of native coronary artery      s/p stent diagonal branch 1996; RCA 03/07/11  . Chronic diastolic heart failure ([place])   . Cardiomegaly   . Diaphragmatic hernia without mention of obstruction or gangrene   . Irritable bowel syndrome   . Other specified genital prolapse(618.89)     corrected with pessary  . Edema 2006  . Other abnormal blood chemistry   . Debility, unspecified   . Open wound of knee, leg (except thigh), and ankle, without mention of complication 0000000    left leg, complicated wound, extended healing, resolved 01/2013  . Transient ischemic attack (TIA), and cerebral infarction without residual deficits 2008  . Shoulder pain 03/30/2013  . Hypertension   . Depression   . Finger numbness 12/28/2013    Left thumb, 1, 2nd fingers  . Dysphagia S/P CVA (cerebrovascular accident) 02/14/2014  . CHF (congestive heart failure) ([place])   . Stroke ([place])   . Chronic diastolic heart failure ([place]) 01/31/2014    01/2014: 2d echo: EF 0000000, grade 1 diastolic dysfunction    Past Surgical History  Procedure Laterality Date  . Appendectomy    . Tonsillectomy and adenoidectomy    . Cataract extraction    . Cardiac catheterization  1996, 2001, 2012  . Carotid stent      diagonal branch of  LCA    Allergies  Allergen Reactions  . Crestor [Rosuvastatin] Other (See Comments)    Leg Weakness   . Ace Inhibitors Cough  . Plavix [Clopidogrel] Nausea And Vomiting      Medication List       This list is accurate as of: 01/15/16 11:59 PM.  Always use your most recent med list.               antiseptic oral rinse Liqd  15 mLs by Mouth Rinse route at bedtime.     DERMA-SMOOTHE/FS SCALP 0.01 % Oil  Apply 1 application topically 2 (two) times daily as needed (for scalp).     esomeprazole 40  MG capsule  Commonly known as:  NEXIUM  Take 40 mg by mouth daily.     furosemide 20 MG tablet  Commonly known as:  LASIX  Take 20 mg by mouth every other day.     ICAPS PO  Take 1 tablet by mouth. Take one tablet twice a day for eyes     iron polysaccharides 150 MG capsule  Commonly known as:  NIFEREX  Take 150 mg by mouth. One capsule each morning     losartan 50 MG tablet  Commonly known as:  COZAAR  Take 50 mg by mouth daily.     LOTEMAX 0.5 % Gel  Generic drug:  Loteprednol Etabonate  Apply 0.5 inches to eye 2 (two) times daily. Left eye     metoprolol succinate 25 MG 24 hr tablet  Commonly known as:  TOPROL-XL  Take 25 mg by mouth daily.     nitroGLYCERIN 0.4 MG SL tablet  Commonly known as:  NITROSTAT  Place 0.4 mg under the tongue every 5 (five) minutes as needed for chest pain.     OPTIVE 0.5-0.9 % Soln  Generic drug:  Carboxymethylcellul-Glycerin  Place 1 drop into the right eye. Three times daily     sertraline 50 MG tablet  Commonly known as:  ZOLOFT  Take 50 mg by mouth daily.     simvastatin 20 MG tablet  Commonly known as:  ZOCOR  Take 1 tablet (20 mg total) by mouth every other day.        Immunization History  Administered Date(s) Administered  . Influenza Whole 09/26/2013  . Influenza-Unspecified 09/26/2014, 10/04/2015  . Pneumococcal Polysaccharide-23 12/22/2008  . Tdap 10/13/2012   Pertinent  Health Maintenance Due  Topic Date Due  . DEXA SCAN  10/25/1980  . PNA vac Low Risk Adult (2 of 2 - PCV13) 12/22/2009  . INFLUENZA VACCINE  07/22/2016   Fall Risk  10/08/2015 09/28/2013  Falls in the past year? No No  Risk for fall due to : History of fall(s) History of fall(s)  Risk for fall due to (comments): - 10/13 fell out of bed    Filed Vitals:   01/15/16 1549  BP: 124/63  Pulse: 67  Temp: 98.2 F (36.8 C)  Resp: 18  Height: 4' 11.3" (1.506 m)  Weight: 141 lb (63.957 kg)  SpO2: 95%   Body mass index is 28.2 kg/(m^2). Physical  Exam  Constitutional: She is oriented to person, place, and time. She appears well-developed and well-nourished. No distress.  Cardiovascular: Normal rate, regular rhythm, normal heart sounds and intact distal pulses.   Pulmonary/Chest: Effort normal and breath sounds normal. No respiratory distress.  Abdominal: Soft. Bowel sounds are normal. She exhibits no distension and no mass. There is no tenderness. There is no rebound and no  guarding. No hernia.  Musculoskeletal: Normal range of motion.  Uses power chair to get around  Neurological: She is alert and oriented to person, place, and time.  Skin: There is pallor.  Psychiatric: She has a normal mood and affect.    Labs reviewed:  Recent Labs  02/23/15 1419 07/12/15 11/07/15 1131  NA 139 140 139  K 5.0 4.6 4.3  CL 106  --  108  CO2 25  --  24  GLUCOSE 85  --  107*  BUN 23 23* 21*  CREATININE 0.80 0.8 0.88  CALCIUM 8.6  --  8.6*    Recent Labs  02/23/15 02/23/15 1419  AST 15 25  ALT 10 14  ALKPHOS 81 85  BILITOT  --  0.4  PROT  --  6.9  ALBUMIN  --  3.7    Recent Labs  02/23/15 1419  02/23/15 1806  11/07/15 1131 11/16/15 01/18/16  WBC 4.5  --  9.0  < > 10.3 8.8 8.9  NEUTROABS  --   --   --   --  6.7  --   --   HGB 14.7  < > 7.4*  < > 7.2* 6.5* 6.6*  HCT 46.8*  < > 23.2*  < > 23.0* 20* 21*  MCV 88.3  --  87.5  --  92.0  --   --   PLT 232  --  439*  < > 445* 445* 413*  < > = values in this interval not displayed. Lab Results  Component Value Date   TSH 0.67 02/23/2015   Lab Results  Component Value Date   HGBA1C 6.1* 02/01/2014   Lab Results  Component Value Date   CHOL 145 02/23/2015   HDL 45 02/23/2015   LDLCALC 82 02/23/2015   TRIG 141 02/23/2015   CHOLHDL 3.4 02/01/2014     Assessment/Plan 1. Frequent loose stools -? Due to iron supplement vs. Ongoing blood loss or dietary cause -she downplayed this when we spoke but did agree to f/u cbc to see how her anemia is doing--if h/h has normalized,  would d/c iron, but this is doubtful when it was in the 6 range  2. Iron deficiency anemia due to chronic blood loss -cont iron and await cbc results  3. Depression -cont zoloft, but she's not thrilled about it--says her mood has always been labile  Family/ staff Communication: discussed with snf nurse  Labs/tests ordered:  Cbc with diff Olympia Adelsberger L. Kimmerly Lora, D.O. [facility] [address], [place] [address] Cell Phone (Mon-Fri 8am-5pm):  [phone] On Call:  [phone] & follow prompts after 5pm & weekends Office Phone:  [phone] Office Fax:  [phone]

## 2016-02-11 DIAGNOSIS — A09 Infectious gastroenteritis and colitis, unspecified: Secondary | ICD-10-CM | POA: Diagnosis not present

## 2016-02-11 DIAGNOSIS — D500 Iron deficiency anemia secondary to blood loss (chronic): Secondary | ICD-10-CM | POA: Diagnosis not present

## 2016-02-22 DIAGNOSIS — D649 Anemia, unspecified: Secondary | ICD-10-CM | POA: Diagnosis not present

## 2016-02-28 DIAGNOSIS — H353212 Exudative age-related macular degeneration, right eye, with inactive choroidal neovascularization: Secondary | ICD-10-CM | POA: Diagnosis not present

## 2016-02-28 DIAGNOSIS — H18422 Band keratopathy, left eye: Secondary | ICD-10-CM | POA: Diagnosis not present

## 2016-02-28 DIAGNOSIS — Z961 Presence of intraocular lens: Secondary | ICD-10-CM | POA: Diagnosis not present

## 2016-02-28 DIAGNOSIS — H04123 Dry eye syndrome of bilateral lacrimal glands: Secondary | ICD-10-CM | POA: Diagnosis not present

## 2016-03-05 ENCOUNTER — Encounter: Payer: Self-pay | Admitting: Cardiology

## 2016-03-05 ENCOUNTER — Ambulatory Visit (INDEPENDENT_AMBULATORY_CARE_PROVIDER_SITE_OTHER): Payer: Medicare Other | Admitting: Cardiology

## 2016-03-05 VITALS — BP 138/70 | HR 62 | Ht 60.0 in | Wt 139.0 lb

## 2016-03-05 DIAGNOSIS — Z8673 Personal history of transient ischemic attack (TIA), and cerebral infarction without residual deficits: Secondary | ICD-10-CM

## 2016-03-05 DIAGNOSIS — I5032 Chronic diastolic (congestive) heart failure: Secondary | ICD-10-CM | POA: Diagnosis not present

## 2016-03-05 DIAGNOSIS — I10 Essential (primary) hypertension: Secondary | ICD-10-CM

## 2016-03-05 DIAGNOSIS — I2510 Atherosclerotic heart disease of native coronary artery without angina pectoris: Secondary | ICD-10-CM | POA: Diagnosis not present

## 2016-03-05 DIAGNOSIS — I2583 Coronary atherosclerosis due to lipid rich plaque: Principal | ICD-10-CM

## 2016-03-05 DIAGNOSIS — D649 Anemia, unspecified: Secondary | ICD-10-CM

## 2016-03-05 NOTE — Patient Instructions (Signed)

## 2016-03-05 NOTE — Progress Notes (Signed)
[facility] [address], [place]  [address] Phone: [phone] Fax:  [phone]  Date:  03/05/2016   ID:  CARENA BOWLAN, DOB 1913/10/28, MRN [id number]  PCP:  Hollace Kinnier, DO   History of Present Illness: Sonya [name] is a 82 y.o. female with coronary artery disease status post ST elevation myocardial infarction receiving an RCA stent with residual moderate LAD disease and obtuse marginal disease here for followup.  She had an episode of hemoglobin 6.5. She states that she was asymptomatic. 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 Dr. Watt Climes saw her GI, colitis.   She's not having any chest pain, no shortness of breath. She gets around in her electric chair. Lives [facility].   CAD-stable with no exertional anginal symptoms. She will occasionally have fleeting chest discomfort, this is usually relieved by burping. In the past her chest discomfort post catheterization/stent placement was relieved with Nexium. I will stop isosorbide. She can call if symptoms arise.   Stroke-unfortunately suffered a stroke in early 2015. Dr. Leonie Man. Dysarthria has improved slightly. Dysphagia has improved. Left-sided facial droop has improved. Aspirin has been stopped because of bleeding.   Hyperlipidemia-in the past, she has not tolerated statins due to leg weakness.  GERD-doing well with Nexium. Seen Dr. Lamonte Sakai for chronic aspiration.  Inez Catalina her friend here with her.     Wt Readings from Last 3 Encounters:  03/05/16 139 lb (63.05 kg)  01/25/16 141 lb (63.957 kg)  01/15/16 141 lb (63.957 kg)     Past Medical History  Diagnosis Date  . Acute bronchitis   . Asthma   . TIA (transient ischemic attack)   . Herpes zoster complication 123456    complicated, left eye involvement  . Nontoxic uninodular goiter     negative [place] 2008  . Macular degeneration (senile) of retina, unspecified 2012    wet  . Acute myocardial infarction, unspecified site,  episode of care unspecified 03/07/11  . Coronary atherosclerosis of native coronary artery      s/p stent diagonal branch 1996; RCA 03/07/11  . Chronic diastolic heart failure ([place])   . Cardiomegaly   . Diaphragmatic hernia without mention of obstruction or gangrene   . Irritable bowel syndrome   . Other specified genital prolapse(618.89)     corrected with pessary  . Edema 2006  . Other abnormal blood chemistry   . Debility, unspecified   . Open wound of knee, leg (except thigh), and ankle, without mention of complication 0000000    left leg, complicated wound, extended healing, resolved 01/2013  . Transient ischemic attack (TIA), and cerebral infarction without residual deficits 2008  . Shoulder pain 03/30/2013  . Hypertension   . Depression   . Finger numbness 12/28/2013    Left thumb, 1, 2nd fingers  . Dysphagia S/P CVA (cerebrovascular accident) 02/14/2014  . CHF (congestive heart failure) ([place])   . Stroke ([place])   . Chronic diastolic heart failure ([place]) 01/31/2014    01/2014: 2d echo: EF 0000000, grade 1 diastolic dysfunction     Past Surgical History  Procedure Laterality Date  . Appendectomy    . Tonsillectomy and adenoidectomy    . Cataract extraction    . Cardiac catheterization  1996, 2001, 2012  . Carotid stent      diagonal branch of LCA  . Coronary angioplasty      Current Outpatient Prescriptions  Medication Sig Dispense Refill  .  Carboxymethylcellul-Glycerin (OPTIVE) 0.5-0.9 % SOLN Place 1 drop into the right eye. Three times daily    . esomeprazole (NEXIUM) 40 MG capsule Take 40 mg by mouth daily.    . furosemide (LASIX) 20 MG tablet Take 20 mg by mouth daily.    . iron polysaccharides (NIFEREX) 150 MG capsule Take 150 mg by mouth. One capsule each morning    . losartan (COZAAR) 50 MG tablet Take 50 mg by mouth daily.    . Loteprednol Etabonate (LOTEMAX) 0.5 % GEL Apply 0.5 inches to eye 2 (two) times daily. Left eye    . metoprolol succinate (TOPROL-XL) 25 MG 24 hr  tablet Take 25 mg by mouth daily.    . Multiple Vitamins-Minerals (ICAPS PO) Take 1 tablet by mouth. Take one tablet twice a day for eyes    . nitroGLYCERIN (NITROSTAT) 0.4 MG SL tablet Place 0.4 mg under the tongue every 5 (five) minutes as needed for chest pain.     Marland Kitchen sertraline (ZOLOFT) 50 MG tablet Take 50 mg by mouth daily.    . simvastatin (ZOCOR) 20 MG tablet Take 20 mg by mouth daily.     No current facility-administered medications for this visit.    Allergies:    Allergies  Allergen Reactions  . Crestor [Rosuvastatin] Other (See Comments)    Leg Weakness   . Ace Inhibitors Cough  . Plavix [Clopidogrel] Nausea And Vomiting    Social History:  The patient  reports that she quit smoking about 52 years ago. She has never used smokeless tobacco. She reports that she drinks about 3.0 oz of alcohol per week. She reports that she does not use illicit drugs.   ROS:  Please see the history of present illness.   Denies any fevers, chills. Recent leg wound. Occasional rare chest pain.   PHYSICAL EXAM: VS:  BP 138/70 mmHg  Pulse 62  Ht 5' (1.524 m)  Wt 139 lb (63.05 kg)  BMI 27.15 kg/m2 Well nourished, well developed, in no acute distress elderly,in motorized wheelchair. HEENT: normal Neck: no JVD Cardiac:  normal S1, S2; RRR; no murmur Lungs:  clear to auscultation bilaterally, no wheezing, rhonchi or rales Abd: soft, nontender, no hepatomegaly Ext: no edema Skin: Right leg improved no edema. Neuro: Very minimal left facial droop. Dysarthria noted.  EKG:  05/01/15-sinus rhythm, 73, old inferior infarct pattern, nonspecific T-wave changes-prior Normal rhythm 63, T wave inversion inferior leads mostly but scattered throughout EKG.     ASSESSMENT AND PLAN:  1. Severe anemia-hemoglobin 6. 5-2 units packed red blood cells, [place]-colitis, GI. She states that she really did not feel much different after the transfusion and she downplays her symptoms prior. I explained to her  that it was important for her to get that blood at the time. No aspirin. 2. Stroke-01/2014- symptom was trouble speaking, dysarthria. Dysphagia and facial droop have improved. Aspirin has been stopped because of anemia incident where hemoglobin was 7.4. She received transfusion.. Seen Dr. Lamonte Sakai for aspiration pneumonia. Thickened liquids. Stopped Advair and this seemed to help as well. 3. Coronary artery disease status post MI-overall doing well. Aspirin stopped because of severe anemia. On simvastatin. Lipids have been excellent.  4. Hypertension-improved. Stable, doing well.  5. Chronic diastolic heart failure-doing well, stable. No shortness of breath. 6. See her back in 6 months  Signed, Candee Furbish, MD [facility]  03/05/2016 2:31 PM

## 2016-03-06 ENCOUNTER — Non-Acute Institutional Stay (SKILLED_NURSING_FACILITY): Payer: Medicare Other | Admitting: Adult Health

## 2016-03-06 DIAGNOSIS — K529 Noninfective gastroenteritis and colitis, unspecified: Secondary | ICD-10-CM

## 2016-03-06 DIAGNOSIS — F329 Major depressive disorder, single episode, unspecified: Secondary | ICD-10-CM

## 2016-03-06 DIAGNOSIS — D500 Iron deficiency anemia secondary to blood loss (chronic): Secondary | ICD-10-CM | POA: Diagnosis not present

## 2016-03-06 DIAGNOSIS — F32A Depression, unspecified: Secondary | ICD-10-CM

## 2016-03-06 DIAGNOSIS — I5032 Chronic diastolic (congestive) heart failure: Secondary | ICD-10-CM

## 2016-03-06 DIAGNOSIS — I10 Essential (primary) hypertension: Secondary | ICD-10-CM

## 2016-03-10 DIAGNOSIS — A09 Infectious gastroenteritis and colitis, unspecified: Secondary | ICD-10-CM | POA: Diagnosis not present

## 2016-03-10 DIAGNOSIS — D500 Iron deficiency anemia secondary to blood loss (chronic): Secondary | ICD-10-CM | POA: Diagnosis not present

## 2016-03-14 NOTE — Telephone Encounter (Signed)
Noted.  I will review EKG and phone note with Dr Marlou Porch once the EKG has been received.

## 2016-03-18 ENCOUNTER — Encounter: Payer: Self-pay | Admitting: Adult Health

## 2016-03-18 DIAGNOSIS — K529 Noninfective gastroenteritis and colitis, unspecified: Secondary | ICD-10-CM | POA: Insufficient documentation

## 2016-03-18 NOTE — Progress Notes (Signed)
Patient ID: Sonya [name], female   DOB: 1913/03/11, 82 y.o.   MRN: [id number]  Location:  [facility]:  SNF (31) Provider:  Cindi Carbon, ANP [place] [phone]   REED, Jonelle Sidle, DO  Patient Care Team: Gayland Curry, DO as PCP - General (Geriatric Medicine) [facility] [place], MD as Consulting Physician (Cardiology) [name], NP as Nurse Practitioner (Obstetrics and Gynecology)  Extended Emergency Contact Information Primary Emergency Contact: Lowe,Susan Address: [address]          [place] [address] [name] of [place] Phone: [phone] Relation: Friend Secondary Emergency Contact: Joyce,Steve Address: [address]          [place], [place] [address] [name] of [place] Phone: [phone] Work Phone: [phone] Mobile Phone: [phone] Relation: Friend  Code Status:  DNR Goals of care: Advanced Directive information Advanced Directives 01/15/2016  Does patient have an advance directive? Yes  Type of Paramedic of [place];Living will;Out of facility DNR (pink MOST or yellow form)  Copy of advanced directive(s) in chart? Yes  Pre-existing out of facility DNR order (yellow form or pink MOST form) Yellow form placed in chart (order not valid for inpatient use)     Chief Complaint  Patient presents with  . Medical Management of Chronic Issues    HPI:  Pt is a 82 y.o. female seen today for medical management of chronic diseases.    1. Anemia due to chronic blood loss -h/o heme pos stools on aspirin -aspirin discontinued with no obvious bleeding -received 2 units of PRBCs in Feb of 2017, H/H improved to 10 from 6.6 -currently on nu iron  2. Depression -some moodiness, hitting, kicking and belligerent behavior so her Zoloft was increased to 75 mg in Feb -staff reports improvement in behavior, however, it is noted that some  of these issues are personality related  3. Chronic diastolic heart failure (HCC) -stable weight, no increased edema -takes 20 mg of lasix a day -followed by Dr. Marlou Porch  4. Essential hypertension -controlled  -currently on losartan, lasix, and toprol  5. Noninfectious colitis, unspecified -Apparently she made an apt with Dr. Watt Climes by herself due to intermittent gas and loose stools, which the staff did not feel was excessive -She had some issues in the past with GI upset on ferrous sulfate which was change to nu iron -She is currently Uceris for colitis and reports some improvement but still has gas and loose stools on occasion  Past Medical History  Diagnosis Date  . Acute bronchitis   . Asthma   . TIA (transient ischemic attack)   . Herpes zoster complication 123456    complicated, left eye involvement  . Nontoxic uninodular goiter     negative [place] 2008  . Macular degeneration (senile) of retina, unspecified 2012    wet  . Acute myocardial infarction, unspecified site, episode of care unspecified 03/07/11  . Coronary atherosclerosis of native coronary artery      s/p stent diagonal branch 1996; RCA 03/07/11  . Chronic diastolic heart failure ([place])   . Cardiomegaly   . Diaphragmatic hernia without mention of obstruction or gangrene   . Irritable bowel syndrome   . Other specified genital prolapse(618.89)     corrected with pessary  . Edema 2006  . Other abnormal blood chemistry   . Debility, unspecified   . Open wound of knee, leg (except thigh), and ankle,  without mention of complication 0000000    left leg, complicated wound, extended healing, resolved 01/2013  . Transient ischemic attack (TIA), and cerebral infarction without residual deficits 2008  . Shoulder pain 03/30/2013  . Hypertension   . Depression   . Finger numbness 12/28/2013    Left thumb, 1, 2nd fingers  . Dysphagia S/P CVA (cerebrovascular accident) 02/14/2014  . CHF (congestive heart failure) ([place])   . Stroke  ([place])   . Chronic diastolic heart failure ([facility]) 01/31/2014    01/2014: 2d echo: EF 0000000, grade 1 diastolic dysfunction    Past Surgical History  Procedure Laterality Date  . Appendectomy    . Tonsillectomy and adenoidectomy    . Cataract extraction    . Cardiac catheterization  1996, 2001, 2012  . Carotid stent      diagonal branch of LCA  . Coronary angioplasty      Allergies  Allergen Reactions  . Crestor [Rosuvastatin] Other (See Comments)    Leg Weakness   . Ace Inhibitors Cough  . Plavix [Clopidogrel] Nausea And Vomiting      Medication List       This list is accurate as of: 03/06/16 11:59 PM.  Always use your most recent med list.               furosemide 20 MG tablet  Commonly known as:  LASIX  Take 20 mg by mouth daily.     ICAPS PO  Take 1 tablet by mouth. Take one tablet twice a day for eyes     iron polysaccharides 150 MG capsule  Commonly known as:  NIFEREX  Take 150 mg by mouth. One capsule each morning     losartan 50 MG tablet  Commonly known as:  COZAAR  Take 50 mg by mouth daily.     LOTEMAX 0.5 % Gel  Generic drug:  Loteprednol Etabonate  Apply 0.5 inches to eye 2 (two) times daily. Left eye     metoprolol succinate 25 MG 24 hr tablet  Commonly known as:  TOPROL-XL  Take 25 mg by mouth daily.     NEXIUM 40 MG capsule  Generic drug:  esomeprazole  Take 40 mg by mouth daily.     nitroGLYCERIN 0.4 MG SL tablet  Commonly known as:  NITROSTAT  Place 0.4 mg under the tongue every 5 (five) minutes as needed for chest pain.     OPTIVE 0.5-0.9 % Soln  Generic drug:  Carboxymethylcellul-Glycerin  Place 1 drop into the right eye. Three times daily     sertraline 50 MG tablet  Commonly known as:  ZOLOFT  Take 75 mg by mouth daily.     simvastatin 20 MG tablet  Commonly known as:  ZOCOR  Take 20 mg by mouth daily.     UCERIS 9 MG Tb24  Generic drug:  Budesonide  Take 1 capsule by mouth daily.        Review of Systems    Constitutional: Negative for fever, chills, diaphoresis, activity change, appetite change, fatigue and unexpected weight change.  HENT: Negative for congestion.   Respiratory: Negative for cough and shortness of breath.   Cardiovascular: Negative for chest pain, palpitations and leg swelling.  Gastrointestinal: Positive for diarrhea. Negative for abdominal pain, constipation, blood in stool and abdominal distention.       Excessive gas  Genitourinary: Negative for dysuria.  Musculoskeletal: Positive for gait problem. Negative for back pain and arthralgias.  Neurological: Positive for facial asymmetry (due  to CVA) and speech difficulty. Negative for dizziness.  Hematological: Negative for adenopathy. Does not bruise/bleed easily.  Psychiatric/Behavioral: Negative for behavioral problems, confusion and agitation.    Wt Readings from Last 3 Encounters:  03/18/16 139 lb 14.4 oz (63.458 kg)  03/05/16 139 lb (63.05 kg)  01/25/16 141 lb (63.957 kg)     Filed Vitals:   03/18/16 1323  Weight: 139 lb 14.4 oz (63.458 kg)   Body mass index is 27.32 kg/(m^2). Physical Exam  Constitutional: She is oriented to person, place, and time. She appears well-developed and well-nourished. No distress.  HENT:  Head: Normocephalic and atraumatic.  Neck: No JVD present.  Cardiovascular: Normal rate and regular rhythm.   No murmur heard. Pulmonary/Chest: Effort normal and breath sounds normal. No respiratory distress. She has no wheezes.  Abdominal: Soft. Bowel sounds are normal. She exhibits no distension. There is no tenderness.  Neurological: She is alert and oriented to person, place, and time.  Skin: Skin is warm and dry. She is not diaphoretic.  Psychiatric: She has a normal mood and affect.    Labs reviewed:  Recent Labs  07/12/15 11/07/15 1131  NA 140 139  K 4.6 4.3  CL  --  108  CO2  --  24  GLUCOSE  --  107*  BUN 23* 21*  CREATININE 0.8 0.88  CALCIUM  --  8.6*   No results for  input(s): AST, ALT, ALKPHOS, BILITOT, PROT, ALBUMIN in the last 8760 hours.  Recent Labs  11/07/15 1131 11/16/15 01/18/16  WBC 10.3 8.8 8.9  NEUTROABS 6.7  --   --   HGB 7.2* 6.5* 6.6*  HCT 23.0* 20* 21*  MCV 92.0  --   --   PLT 445* 445* 413*   Lab Results  Component Value Date   TSH 0.67 02/23/2015   Lab Results  Component Value Date   HGBA1C 6.1* 02/01/2014   Lab Results  Component Value Date   CHOL 145 02/23/2015   HDL 45 02/23/2015   LDLCALC 82 02/23/2015   TRIG 141 02/23/2015   CHOLHDL 3.4 02/01/2014    Significant Diagnostic Results in last 30 days:  No results found.  Assessment/Plan 1. Anemia due to chronic blood loss -improved with blood transfusion, has a hx of heme pos stool son aspirin which was discontinued -no obvious bleeding at this point, would not work up given her age (resident agrees) -did not respond well to iron and the resident had stomach cramps and occasional loose stools with ferrous sulfate -this was changed to nu iron but she continued to have symptoms -will d/c iron and see if this helps her symptoms, which are already somewhat better but still present on uceris  2. Depression -improved, continue zoloft 75 mg  3. Chronic diastolic heart failure (HCC) -stable -followed by cards  4. Essential hypertension -controlled -BMP in 2 weeks  5. Noninfectious gastroenteritis, unspecified -followed by Dr. Watt Climes, on Uceris taper -? If the iron was exacerbating her symptoms   Family/ staff Communication: discussed with resident  Labs/tests ordered:  CBC and BMP in 2 weeks   Cindi Carbon, [place] [phone]

## 2016-03-20 DIAGNOSIS — Z79899 Other long term (current) drug therapy: Secondary | ICD-10-CM | POA: Diagnosis not present

## 2016-03-20 DIAGNOSIS — D649 Anemia, unspecified: Secondary | ICD-10-CM | POA: Diagnosis not present

## 2016-03-20 LAB — BASIC METABOLIC PANEL
BUN: 20 mg/dL (ref 4–21)
Creatinine: 0.7 mg/dL (ref 0.5–1.1)
Glucose: 85 mg/dL
Potassium: 4.3 mmol/L (ref 3.4–5.3)
Sodium: 143 mmol/L (ref 137–147)

## 2016-03-20 LAB — CBC AND DIFFERENTIAL
HCT: 27 % — AB (ref 36–46)
Hemoglobin: 8.9 g/dL — AB (ref 12.0–16.0)
Platelets: 411 10*3/uL — AB (ref 150–399)
WBC: 9.2 10^3/mL

## 2016-03-20 NOTE — Telephone Encounter (Signed)
Old phone note - this has been completed.

## 2016-03-27 DIAGNOSIS — B351 Tinea unguium: Secondary | ICD-10-CM | POA: Diagnosis not present

## 2016-03-27 DIAGNOSIS — L84 Corns and callosities: Secondary | ICD-10-CM | POA: Diagnosis not present

## 2016-04-23 DIAGNOSIS — H18422 Band keratopathy, left eye: Secondary | ICD-10-CM | POA: Diagnosis not present

## 2016-04-23 DIAGNOSIS — H353212 Exudative age-related macular degeneration, right eye, with inactive choroidal neovascularization: Secondary | ICD-10-CM | POA: Diagnosis not present

## 2016-04-24 ENCOUNTER — Non-Acute Institutional Stay (SKILLED_NURSING_FACILITY): Payer: Medicare Other | Admitting: Adult Health

## 2016-04-24 DIAGNOSIS — T148 Other injury of unspecified body region: Secondary | ICD-10-CM | POA: Diagnosis not present

## 2016-04-24 DIAGNOSIS — L03115 Cellulitis of right lower limb: Secondary | ICD-10-CM | POA: Diagnosis not present

## 2016-04-24 DIAGNOSIS — T148XXA Other injury of unspecified body region, initial encounter: Secondary | ICD-10-CM

## 2016-04-24 DIAGNOSIS — D500 Iron deficiency anemia secondary to blood loss (chronic): Secondary | ICD-10-CM

## 2016-04-25 ENCOUNTER — Encounter: Payer: Self-pay | Admitting: Adult Health

## 2016-04-25 DIAGNOSIS — D509 Iron deficiency anemia, unspecified: Secondary | ICD-10-CM | POA: Diagnosis not present

## 2016-04-25 DIAGNOSIS — L03115 Cellulitis of right lower limb: Secondary | ICD-10-CM | POA: Diagnosis not present

## 2016-04-25 LAB — CBC AND DIFFERENTIAL
HCT: 29 % — AB (ref 36–46)
Hemoglobin: 8.2 g/dL — AB (ref 12.0–16.0)
Platelets: 445 10*3/uL — AB (ref 150–399)
WBC: 8.8 10^3/mL

## 2016-04-25 NOTE — Progress Notes (Signed)
Patient ID: Sonya [name], female   DOB: 1913-01-27, 82 y.o.   MRN: [id number]  Location:  [facility]:  SNF (31) Provider:  Cindi Carbon, ANP [place] [phone]   REED, Jonelle Sidle, DO  Patient Care Team: Gayland Curry, DO as PCP - General (Geriatric Medicine) Well [place], MD as Consulting Physician (Cardiology) [name], NP as Nurse Practitioner (Obstetrics and Gynecology)  Extended Emergency Contact Information Primary Emergency Contact: Lowe,Susan Address: [address]          [place] [address] [name] of [place] Phone: [phone] Relation: Friend Secondary Emergency Contact: Joyce,Steve Address: [address]          [place], [place] [address] [name] of [place] Phone: [phone] Work Phone: [phone] Mobile Phone: [phone] Relation: Friend  Code Status:  DNR Goals of care: Advanced Directive information Advanced Directives 01/15/2016  Does patient have an advance directive? Yes  Type of Paramedic of [place];Living will;Out of facility DNR (pink MOST or yellow form)  Copy of advanced directive(s) in chart? Yes  Pre-existing out of facility DNR order (yellow form or pink MOST form) Yellow form placed in chart (order not valid for inpatient use)     Chief Complaint  Patient presents with  . Acute Visit    skin tear, edema, swelling    HPI:  Pt is a 82 y.o. female seen today right leg swelling and erythema with a skin tear, also has left shoulder pain.  Right lower ext swelling Noted during my exam with erythema, after she asked me to look at a skin tear she sustained in the previous week I believe, by hitting her leg on the bed while in her scooter. She reports some pain to the right lower ext.  She has not had any pain or SOB. Denies fever or chills.    Shoulder pain She reports having some pain to her  scapular area for the past few days, sometimes it bothers her at night. She has not injured it in any way by her account but does report when she uses the stand up lift she has to "pull herself up". She reports that it is getting better everyday.  Anemia due to chronic blood loss -h/o heme pos stools on aspirin -aspirin discontinued with no obvious bleeding -received 2 units of PRBCs in Feb of 2017, H/H improved to 10 from 6.6 -currently on nu iron    Past Medical History  Diagnosis Date  . Acute bronchitis   . Asthma   . TIA (transient ischemic attack)   . Herpes zoster complication 123456    complicated, left eye involvement  . Nontoxic uninodular goiter     negative [place] 2008  . Macular degeneration (senile) of retina, unspecified 2012    wet  . Acute myocardial infarction, unspecified site, episode of care unspecified 03/07/11  . Coronary atherosclerosis of native coronary artery      s/p stent diagonal branch 1996; RCA 03/07/11  . Chronic diastolic heart failure ([place])   . Cardiomegaly   . Diaphragmatic hernia without mention of obstruction or gangrene   . Irritable bowel syndrome   . Other specified genital prolapse(618.89)     corrected with pessary  . Edema 2006  . Other abnormal blood chemistry   . Debility, unspecified   . Open wound of knee, leg (except thigh), and ankle, without mention of complication 0000000  left leg, complicated wound, extended healing, resolved 01/2013  . Transient ischemic attack (TIA), and cerebral infarction without residual deficits 2008  . Shoulder pain 03/30/2013  . Hypertension   . Depression   . Finger numbness 12/28/2013    Left thumb, 1, 2nd fingers  . Dysphagia S/P CVA (cerebrovascular accident) 02/14/2014  . CHF (congestive heart failure) ([place])   . Stroke ([place])   . Chronic diastolic heart failure ([place]) 01/31/2014    01/2014: 2d echo: EF 0000000, grade 1 diastolic dysfunction    Past Surgical History  Procedure Laterality Date  .  Appendectomy    . Tonsillectomy and adenoidectomy    . Cataract extraction    . Cardiac catheterization  1996, 2001, 2012  . Carotid stent      diagonal branch of LCA  . Coronary angioplasty      Allergies  Allergen Reactions  . Crestor [Rosuvastatin] Other (See Comments)    Leg Weakness   . Ace Inhibitors Cough  . Plavix [Clopidogrel] Nausea And Vomiting      Medication List       This list is accurate as of: 04/24/16 11:59 PM.  Always use your most recent med list.               furosemide 20 MG tablet  Commonly known as:  LASIX  Take 20 mg by mouth daily.     ICAPS PO  Take 1 tablet by mouth. Take one tablet twice a day for eyes     iron polysaccharides 150 MG capsule  Commonly known as:  NIFEREX  Take 150 mg by mouth 2 (two) times daily. One capsule each morning     losartan 50 MG tablet  Commonly known as:  COZAAR  Take 50 mg by mouth daily.     LOTEMAX 0.5 % Gel  Generic drug:  Loteprednol Etabonate  Apply 0.5 inches to eye 2 (two) times daily. Left eye     metoprolol succinate 25 MG 24 hr tablet  Commonly known as:  TOPROL-XL  Take 25 mg by mouth daily.     NEXIUM 40 MG capsule  Generic drug:  esomeprazole  Take 40 mg by mouth daily.     nitroGLYCERIN 0.4 MG SL tablet  Commonly known as:  NITROSTAT  Place 0.4 mg under the tongue every 5 (five) minutes as needed for chest pain.     OPTIVE 0.5-0.9 % Soln  Generic drug:  Carboxymethylcellul-Glycerin  Place 1 drop into the right eye. Three times daily     sertraline 50 MG tablet  Commonly known as:  ZOLOFT  Take 75 mg by mouth daily.     simvastatin 20 MG tablet  Commonly known as:  ZOCOR  Take 20 mg by mouth daily.     UCERIS 9 MG Tb24  Generic drug:  Budesonide  Take 1 capsule by mouth daily.        Review of Systems  Constitutional: Negative for fever, chills, diaphoresis, activity change, appetite change, fatigue and unexpected weight change.  HENT: Negative for congestion.     Respiratory: Negative for cough and shortness of breath.   Cardiovascular: Positive for leg swelling. Negative for chest pain and palpitations.  Gastrointestinal: Negative for abdominal pain, diarrhea, constipation, blood in stool and abdominal distention.       Excessive gas  Genitourinary: Negative for dysuria.  Musculoskeletal: Positive for arthralgias and gait problem. Negative for back pain.  Skin: Positive for rash and wound.  Neurological: Positive for  facial asymmetry (due to CVA) and speech difficulty. Negative for dizziness.  Hematological: Negative for adenopathy. Does not bruise/bleed easily.  Psychiatric/Behavioral: Negative for behavioral problems, confusion and agitation.    Wt Readings from Last 3 Encounters:  03/18/16 139 lb 14.4 oz (63.458 kg)  03/05/16 139 lb (63.05 kg)  01/25/16 141 lb (63.957 kg)     Filed Vitals:   04/24/16 1657  BP: 124/71  Pulse: 69  Temp: 98.4 F (36.9 C)  Resp: 18  SpO2: 96%   There is no weight on file to calculate BMI. Physical Exam  Constitutional: She is oriented to person, place, and time. She appears well-developed and well-nourished. No distress.  HENT:  Head: Normocephalic and atraumatic.  Neck: No JVD present.  Cardiovascular: Normal rate and regular rhythm.   No murmur heard. Pulmonary/Chest: Effort normal and breath sounds normal. No respiratory distress. She has no wheezes.  Abdominal: Soft. Bowel sounds are normal. She exhibits no distension. There is no tenderness.  Musculoskeletal: She exhibits no edema or tenderness.  Neurological: She is alert and oriented to person, place, and time.  Skin: Skin is warm and dry. She is not diaphoretic.  Right lower ext (below knee) with skin tear approximated by steri strips. No drainage. Below this area is warm, swollen with erythema. There is a small amt of yellow drainage from the skin tear.    Psychiatric: She has a normal mood and affect.    Labs reviewed:  Recent Labs   07/12/15 11/07/15 1131  NA 140 139  K 4.6 4.3  CL  --  108  CO2  --  24  GLUCOSE  --  107*  BUN 23* 21*  CREATININE 0.8 0.88  CALCIUM  --  8.6*   No results for input(s): AST, ALT, ALKPHOS, BILITOT, PROT, ALBUMIN in the last 8760 hours.  Recent Labs  11/07/15 1131 11/16/15 01/18/16  WBC 10.3 8.8 8.9  NEUTROABS 6.7  --   --   HGB 7.2* 6.5* 6.6*  HCT 23.0* 20* 21*  MCV 92.0  --   --   PLT 445* 445* 413*   Lab Results  Component Value Date   TSH 0.67 02/23/2015   Lab Results  Component Value Date   HGBA1C 6.1* 02/01/2014   Lab Results  Component Value Date   CHOL 145 02/23/2015   HDL 45 02/23/2015   LDLCALC 82 02/23/2015   TRIG 141 02/23/2015   CHOLHDL 3.4 02/01/2014    Significant Diagnostic Results in last 30 days:  No results found.  Assessment/Plan  1. Cellulitis of right lower extremity -Doxycycline 100 mg BID for 7 days with florastor -it is noted that the resident has a hx of right leg swelling that was first diagnosed by a doppler study to be a dvt but was later sent to the hospital where it was noted that there was only a superficial thrombosis.  Since that time she has declined further doppler studies.  Given then fact there is a skin tear to this area that appears infected I would lean torward the diagnosis of cellulitis.  Will monitor.   2. Muscle strain -noted to left shoulder trapezoid area, improving -continue stretching with her trainer and prn tylenol  3. Anemia due to chronic blood loss -improving -continue nu iron and recheck [place], [place] [phone]

## 2016-05-02 ENCOUNTER — Encounter: Payer: Self-pay | Admitting: Adult Health

## 2016-05-02 ENCOUNTER — Non-Acute Institutional Stay (SKILLED_NURSING_FACILITY): Payer: Medicare Other | Admitting: Adult Health

## 2016-05-02 DIAGNOSIS — L03115 Cellulitis of right lower limb: Secondary | ICD-10-CM | POA: Diagnosis not present

## 2016-05-02 NOTE — Progress Notes (Signed)
Patient ID: Sonya [name], female   DOB: [birth year], 82 y.o.   MRN: [id number]  Location:  [facility]:  SNF (31) Provider:  Cindi Carbon, ANP [place] [phone]   REED, Jonelle Sidle, DO  Patient Care Team: Gayland Curry, DO as PCP - General (Geriatric Medicine) Well [place], MD as Consulting Physician (Cardiology) [name], NP as Nurse Practitioner (Obstetrics and Gynecology)  Extended Emergency Contact Information Primary Emergency Contact: Lowe,Susan Address: [address]          [place] [address] [name] of [place] Phone: [phone] Relation: Friend Secondary Emergency Contact: Joyce,Steve Address: [address]          [place], [place] [address] [name] of [place] Phone: [phone] Work Phone: [phone] Mobile Phone: [phone] Relation: Friend  Code Status:  DNR Goals of care: Advanced Directive information Advanced Directives 01/15/2016  Does patient have an advance directive? Yes  Type of Paramedic of [place];Living will;Out of facility DNR (pink MOST or yellow form)  Copy of advanced directive(s) in chart? Yes  Pre-existing out of facility DNR order (yellow form or pink MOST form) Yellow form placed in chart (order not valid for inpatient use)     Chief Complaint  Patient presents with  . Acute Visit    f/u cellulitis    HPI:  Pt is a 82 y.o. female seen today to follow up regarding RLE cellulitis. She was treated with a 1 week course of Doxycycline.  The area remains red and swollen without pain or sob. She has been afebrile.      Past Medical History  Diagnosis Date  . Acute bronchitis   . Asthma   . TIA (transient ischemic attack)   . Herpes zoster complication 123456    complicated, left eye involvement  . Nontoxic uninodular goiter     negative [place] 2008  . Macular degeneration (senile) of retina,  unspecified 2012    wet  . Acute myocardial infarction, unspecified site, episode of care unspecified 03/07/11  . Coronary atherosclerosis of native coronary artery      s/p stent diagonal branch 1996; RCA 03/07/11  . Chronic diastolic heart failure ([place])   . Cardiomegaly   . Diaphragmatic hernia without mention of obstruction or gangrene   . Irritable bowel syndrome   . Other specified genital prolapse(618.89)     corrected with pessary  . Edema 2006  . Other abnormal blood chemistry   . Debility, unspecified   . Open wound of knee, leg (except thigh), and ankle, without mention of complication 0000000    left leg, complicated wound, extended healing, resolved 01/2013  . Transient ischemic attack (TIA), and cerebral infarction without residual deficits 2008  . Shoulder pain 03/30/2013  . Hypertension   . Depression   . Finger numbness 12/28/2013    Left thumb, 1, 2nd fingers  . Dysphagia S/P CVA (cerebrovascular accident) 02/14/2014  . CHF (congestive heart failure) ([place])   . Stroke ([place])   . Chronic diastolic heart failure ([place]) 01/31/2014    01/2014: 2d echo: EF 0000000, grade 1 diastolic dysfunction    Past Surgical History  Procedure Laterality Date  . Appendectomy    . Tonsillectomy and adenoidectomy    . Cataract extraction    . Cardiac catheterization  1996, 2001, 2012  . Carotid stent      diagonal branch of LCA  . Coronary angioplasty  Allergies  Allergen Reactions  . Crestor [Rosuvastatin] Other (See Comments)    Leg Weakness   . Ace Inhibitors Cough  . Plavix [Clopidogrel] Nausea And Vomiting      Medication List       This list is accurate as of: 05/02/16 12:53 PM.  Always use your most recent med list.               furosemide 20 MG tablet  Commonly known as:  LASIX  Take 20 mg by mouth daily.     ICAPS PO  Take 1 tablet by mouth. Take one tablet twice a day for eyes     iron polysaccharides 150 MG capsule  Commonly known as:  NIFEREX  Take 150  mg by mouth 2 (two) times daily. One capsule each morning     losartan 50 MG tablet  Commonly known as:  COZAAR  Take 50 mg by mouth daily.     LOTEMAX 0.5 % Gel  Generic drug:  Loteprednol Etabonate  Apply 0.5 inches to eye 2 (two) times daily. Left eye     metoprolol succinate 25 MG 24 hr tablet  Commonly known as:  TOPROL-XL  Take 25 mg by mouth daily.     NEXIUM 40 MG capsule  Generic drug:  esomeprazole  Take 40 mg by mouth daily.     nitroGLYCERIN 0.4 MG SL tablet  Commonly known as:  NITROSTAT  Place 0.4 mg under the tongue every 5 (five) minutes as needed for chest pain.     OPTIVE 0.5-0.9 % Soln  Generic drug:  Carboxymethylcellul-Glycerin  Place 1 drop into the right eye. Three times daily     sertraline 50 MG tablet  Commonly known as:  ZOLOFT  Take 75 mg by mouth daily.     simvastatin 20 MG tablet  Commonly known as:  ZOCOR  Take 20 mg by mouth daily.     UCERIS 9 MG Tb24  Generic drug:  Budesonide  Take 1 capsule by mouth daily.        Review of Systems  Constitutional: Negative for fever, chills, diaphoresis, activity change, appetite change, fatigue and unexpected weight change.  HENT: Negative for congestion.   Respiratory: Negative for cough and shortness of breath.   Cardiovascular: Positive for leg swelling. Negative for chest pain and palpitations.  Gastrointestinal: Negative for abdominal pain, diarrhea, constipation, blood in stool and abdominal distention.       Excessive gas  Genitourinary: Negative for dysuria.  Musculoskeletal: Positive for arthralgias and gait problem. Negative for back pain.  Skin: Positive for rash and wound.  Neurological: Positive for facial asymmetry (due to CVA) and speech difficulty. Negative for dizziness.  Hematological: Negative for adenopathy. Does not bruise/bleed easily.  Psychiatric/Behavioral: Negative for behavioral problems, confusion and agitation.    Wt Readings from Last 3 Encounters:  03/18/16  139 lb 14.4 oz (63.458 kg)  03/05/16 139 lb (63.05 kg)  01/25/16 141 lb (63.957 kg)     Filed Vitals:   05/02/16 1250  BP: 145/69  Pulse: 65  Temp: 97.8 F (36.6 C)  Resp: 16   There is no weight on file to calculate BMI. Physical Exam  Constitutional: She is oriented to person, place, and time. She appears well-developed and well-nourished. No distress.  HENT:  Head: Normocephalic and atraumatic.  Neck: No JVD present.  Cardiovascular: Normal rate and regular rhythm.   No murmur heard. Pulmonary/Chest: Effort normal and breath sounds normal. No respiratory distress.  She has no wheezes.  Abdominal: Soft. Bowel sounds are normal. She exhibits no distension. There is no tenderness.  Musculoskeletal: She exhibits no edema or tenderness.  Neurological: She is alert and oriented to person, place, and time.  Skin: Skin is warm and dry. She is not diaphoretic.  Right lower ext (below knee) with skin tear approximated by steri strips. No drainage. Below this area is warm, swollen with erythema. No calf prominence or tenderness to palpation.   Psychiatric: She has a normal mood and affect.    Labs reviewed:  Recent Labs  07/12/15 11/07/15 1131  NA 140 139  K 4.6 4.3  CL  --  108  CO2  --  24  GLUCOSE  --  107*  BUN 23* 21*  CREATININE 0.8 0.88  CALCIUM  --  8.6*   No results for input(s): AST, ALT, ALKPHOS, BILITOT, PROT, ALBUMIN in the last 8760 hours.  Recent Labs  11/07/15 1131 11/16/15 01/18/16  WBC 10.3 8.8 8.9  NEUTROABS 6.7  --   --   HGB 7.2* 6.5* 6.6*  HCT 23.0* 20* 21*  MCV 92.0  --   --   PLT 445* 445* 413*   Lab Results  Component Value Date   TSH 0.67 02/23/2015   Lab Results  Component Value Date   HGBA1C 6.1* 02/01/2014   Lab Results  Component Value Date   CHOL 145 02/23/2015   HDL 45 02/23/2015   LDLCALC 82 02/23/2015   TRIG 141 02/23/2015   CHOLHDL 3.4 02/01/2014    Significant Diagnostic Results in last 30 days:  No results  found.  Assessment/Plan  1. Cellulitis of right lower extremity -Completed Doxycycline for 1 week but refuses to take any further antibiotics or undergo any further testing until she can meet with Dr. Mariea Clonts.  She continues with some swelling erythema to the RLE that is only slightly improved from the previous exam.  She is afebrile and in no pain. Her calf is not pronounced in size and there is no tenderness.  She continues with a healing skin tear to the right shin.    -it is noted that the resident has a hx of right leg swelling that was first diagnosed by a doppler study to be a dvt but was later sent to the hospital where it was noted that there was only a superficial thrombosis.  Since that time she has declined further doppler studies.  She is aware of the risk but has declined additional work up or antibiotics at this time.   Cindi Carbon, ANP [facility] [phone]

## 2016-05-06 ENCOUNTER — Encounter: Payer: Self-pay | Admitting: Internal Medicine

## 2016-05-06 ENCOUNTER — Non-Acute Institutional Stay (SKILLED_NURSING_FACILITY): Payer: Medicare Other | Admitting: Internal Medicine

## 2016-05-06 DIAGNOSIS — I82811 Embolism and thrombosis of superficial veins of right lower extremities: Secondary | ICD-10-CM | POA: Diagnosis not present

## 2016-05-06 DIAGNOSIS — R143 Flatulence: Secondary | ICD-10-CM

## 2016-05-06 DIAGNOSIS — I639 Cerebral infarction, unspecified: Secondary | ICD-10-CM | POA: Diagnosis not present

## 2016-05-06 DIAGNOSIS — S81801D Unspecified open wound, right lower leg, subsequent encounter: Secondary | ICD-10-CM | POA: Diagnosis not present

## 2016-05-06 DIAGNOSIS — L03115 Cellulitis of right lower limb: Secondary | ICD-10-CM | POA: Diagnosis not present

## 2016-05-06 DIAGNOSIS — R278 Other lack of coordination: Secondary | ICD-10-CM | POA: Diagnosis not present

## 2016-05-06 DIAGNOSIS — I5032 Chronic diastolic (congestive) heart failure: Secondary | ICD-10-CM | POA: Diagnosis not present

## 2016-05-06 DIAGNOSIS — M6281 Muscle weakness (generalized): Secondary | ICD-10-CM | POA: Diagnosis not present

## 2016-05-06 DIAGNOSIS — I6350 Cerebral infarction due to unspecified occlusion or stenosis of unspecified cerebral artery: Secondary | ICD-10-CM | POA: Diagnosis not present

## 2016-05-06 DIAGNOSIS — S81811D Laceration without foreign body, right lower leg, subsequent encounter: Secondary | ICD-10-CM

## 2016-05-06 NOTE — Progress Notes (Signed)
Patient ID: Sonya [name], female   DOB: Apr 23, 1913, 82 y.o.   MRN: [id number]  Location:  [facility] Room Number: 112 Place of Service:  SNF (31) Provider:  Kenni Newton L. Mariea Clonts, D.O., C.M.D.  Hollace Kinnier, DO  Patient Care Team: Gayland Curry, DO as PCP - General (Geriatric Medicine) Well [place], MD as Consulting Physician (Cardiology) [name], NP as Nurse Practitioner (Obstetrics and Gynecology)  Extended Emergency Contact Information Primary Emergency Contact: Lowe,Susan Address: [address]          [place] [address] [name] of [place] Phone: [phone] Relation: Friend Secondary Emergency Contact: Joyce,Steve Address: [address]          [place], [place] [address] [name] of [place] Phone: [phone] Work Phone: [phone] Mobile Phone: [phone] Relation: Friend  Code Status:  DNR Goals of care: Advanced Directive information Advanced Directives 05/06/2016  Does patient have an advance directive? Yes  Type of Advance Directive Out of facility DNR (pink MOST or yellow form);[place];Living will  Copy of advanced directive(s) in chart? Yes  Pre-existing out of facility DNR order (yellow form or pink MOST form) Yellow form placed in chart (order not valid for inpatient use)     Chief Complaint  Patient presents with  . Acute Visit    leg  . Routine visit    HPI:  Pt is a 82 y.o. female seen today for an acute visit for f/u on her leg and also her routine visit/med mgt of chronic diseases.    5/4, she was seen by NP Wert for an acute visit due to a skin tear of her right leg--she had erythema and was diagnosed with cellulitis.  She was started on doxycycline 100mg  po bid for 7 days along with florastor.  She was then seen again on 5/12 and still appeared red and swollen w/o pain or dyspnea.  She has been afebrile the entire time.  She refuses  further dopplers since an initial one indicated DVT and f/u with IR was negative.  She also declined more antibiotics at this point.  I'm told she requested to see me.  When seen today, she reported that she hit her right leg on someone else's wheelchair to sustain the skin tear and was having regular dressing changes.  She now has steristrips on that and the area beneath is wrapped with kerlix.  She has some mild redness and tenderness of the lateral aspect of the leg with slight pitting edema, but she notes it's much better than it was but she's just not sure why the outer part is still sore.  Past Medical History  Diagnosis Date  . Acute bronchitis   . Asthma   . TIA (transient ischemic attack)   . Herpes zoster complication 123456    complicated, left eye involvement  . Nontoxic uninodular goiter     negative [place] 2008  . Macular degeneration (senile) of retina, unspecified 2012    wet  . Acute myocardial infarction, unspecified site, episode of care unspecified 03/07/11  . Coronary atherosclerosis of native coronary artery      s/p stent diagonal branch 1996; RCA 03/07/11  . Chronic diastolic heart failure ([place])   . Cardiomegaly   . Diaphragmatic hernia without mention of obstruction or gangrene   . Irritable bowel syndrome   . Other specified genital prolapse(618.89)     corrected with pessary  . Edema 2006  .  Other abnormal blood chemistry   . Debility, unspecified   . Open wound of knee, leg (except thigh), and ankle, without mention of complication 0000000    left leg, complicated wound, extended healing, resolved 01/2013  . Transient ischemic attack (TIA), and cerebral infarction without residual deficits 2008  . Shoulder pain 03/30/2013  . Hypertension   . Depression   . Finger numbness 12/28/2013    Left thumb, 1, 2nd fingers  . Dysphagia S/P CVA (cerebrovascular accident) 02/14/2014  . CHF (congestive heart failure) ([place])   . Stroke ([place])   . Chronic diastolic heart failure  ([place]) 01/31/2014    01/2014: 2d echo: EF 0000000, grade 1 diastolic dysfunction    Past Surgical History  Procedure Laterality Date  . Appendectomy    . Tonsillectomy and adenoidectomy    . Cataract extraction    . Cardiac catheterization  1996, 2001, 2012  . Carotid stent      diagonal branch of LCA  . Coronary angioplasty      Allergies  Allergen Reactions  . Crestor [Rosuvastatin] Other (See Comments)    Leg Weakness   . Ace Inhibitors Cough  . Plavix [Clopidogrel] Nausea And Vomiting      Medication List       This list is accurate as of: 05/06/16  1:59 PM.  Always use your most recent med list.               antiseptic oral rinse Liqd  15 mLs by Mouth Rinse route at bedtime.     fluocinolone 0.01 % external solution  Commonly known as:  SYNALAR  Apply 1-2 application topically 2 (two) times daily.     furosemide 20 MG tablet  Commonly known as:  LASIX  Take 20 mg by mouth daily.     hydrocortisone cream 1 %  Mix with mositurelle cream then apply small amount to affected areas as needed for rash     iron polysaccharides 150 MG capsule  Commonly known as:  NIFEREX  Take 150 mg by mouth 2 (two) times daily. One capsule each morning     losartan 50 MG tablet  Commonly known as:  COZAAR  Take 50 mg by mouth daily.     LOTEMAX 0.5 % Gel  Generic drug:  Loteprednol Etabonate  Apply 0.5 inches to eye 2 (two) times daily. Left eye     metoprolol succinate 25 MG 24 hr tablet  Commonly known as:  TOPROL-XL  Take 25 mg by mouth daily.     NEXIUM 40 MG capsule  Generic drug:  esomeprazole  Take 40 mg by mouth daily.     nitroGLYCERIN 0.4 MG SL tablet  Commonly known as:  NITROSTAT  Place 0.4 mg under the tongue every 5 (five) minutes as needed for chest pain.     OPTIVE 0.5-0.9 % Soln  Generic drug:  Carboxymethylcellul-Glycerin  Place 1 drop into the right eye. Three times daily     PRESERVISION AREDS 2 PO  Take 1 capsule by mouth 2 (two) times daily.       sertraline 50 MG tablet  Commonly known as:  ZOLOFT  Take 75 mg by mouth daily.     Simethicone 250 MG Caps  Take by mouth 2 (two) times daily as needed (gas).     simvastatin 20 MG tablet  Commonly known as:  ZOCOR  Take 20 mg by mouth daily.     triamcinolone 0.025 % cream  Commonly known as:  KENALOG  Apply 1 application topically 2 (two) times daily.     UCERIS 9 MG Tb24  Generic drug:  Budesonide  Take 1 capsule by mouth daily.        Review of Systems  Constitutional: Negative for fever, chills, activity change and appetite change.  HENT: Negative for congestion.   Cardiovascular: Positive for leg swelling. Negative for chest pain and palpitations.       Mild of right leg  Gastrointestinal: Negative for nausea, abdominal pain, diarrhea, constipation and blood in stool.       Still c/o flatus  Genitourinary: Negative for dysuria.  Musculoskeletal: Positive for arthralgias.  Neurological: Negative for dizziness.  Psychiatric/Behavioral: Negative for agitation.       Occasional small amt of short term memory loss    Immunization History  Administered Date(s) Administered  . Influenza Whole 09/26/2013  . Influenza-Unspecified 09/26/2014, 10/04/2015  . Pneumococcal Polysaccharide-23 12/22/2008  . Tdap 10/13/2012   Pertinent  Health Maintenance Due  Topic Date Due  . DEXA SCAN  10/25/1980  . PNA vac Low Risk Adult (2 of 2 - PCV13) 12/22/2009  . INFLUENZA VACCINE  07/22/2016   Fall Risk  10/08/2015 09/28/2013  Falls in the past year? No No  Risk for fall due to : History of fall(s) History of fall(s)  Risk for fall due to (comments): - 10/13 fell out of bed   Functional Status Survey: Is the patient deaf or have difficulty hearing?: Yes Does the patient have difficulty seeing, even when wearing glasses/contacts?: Yes Does the patient have difficulty concentrating, remembering, or making decisions?: Yes (mild) Does the patient have difficulty walking or  climbing stairs?: Yes Does the patient have difficulty dressing or bathing?: Yes Does the patient have difficulty doing errands alone such as visiting a doctor's office or shopping?: Yes  Filed Vitals:   05/06/16 1148  BP: 115/69  Pulse: 73  Temp: 97.7 F (36.5 C)  TempSrc: Oral  Resp: 19  Weight: 138 lb (62.596 kg)  SpO2: 96%   Body mass index is 26.95 kg/(m^2). Physical Exam  Constitutional: She is oriented to person, place, and time. She appears well-developed and well-nourished. No distress.  Cardiovascular: Normal rate, regular rhythm, normal heart sounds and intact distal pulses.   Mild right lower leg edema and erythema, see skin  Pulmonary/Chest: Effort normal and breath sounds normal. No respiratory distress. She has no rales.  Abdominal: Soft. Bowel sounds are normal.  Musculoskeletal: Normal range of motion.  Neurological: She is alert and oriented to person, place, and time.  Skin:  Small skin tear with steristrips in place on proximal lower leg  Psychiatric: She has a normal mood and affect.    Labs reviewed:  Recent Labs  07/12/15 11/07/15 1131  NA 140 139  K 4.6 4.3  CL  --  108  CO2  --  24  GLUCOSE  --  107*  BUN 23* 21*  CREATININE 0.8 0.88  CALCIUM  --  8.6*   No results for input(s): AST, ALT, ALKPHOS, BILITOT, PROT, ALBUMIN in the last 8760 hours.  Recent Labs  11/07/15 1131 11/16/15 01/18/16 04/25/16 0900  WBC 10.3 8.8 8.9 8.8  NEUTROABS 6.7  --   --   --   HGB 7.2* 6.5* 6.6* 8.2*  HCT 23.0* 20* 21* 29*  MCV 92.0  --   --   --   PLT 445* 445* 413* 445*   Lab Results  Component Value Date   TSH  0.67 02/23/2015   Lab Results  Component Value Date   HGBA1C 6.1* 02/01/2014   Lab Results  Component Value Date   CHOL 145 02/23/2015   HDL 45 02/23/2015   LDLCALC 82 02/23/2015   TRIG 141 02/23/2015   CHOLHDL 3.4 02/01/2014    Assessment/Plan 1. Cellulitis of leg, right -resolved to my eye -still has some mild right lateral leg  pain and minimal erythema distal to the skin tear  2. Skin tear of right lower leg without complication, subsequent encounter -steristrips intact to two areas  3. Superficial thrombosis of leg, right -historically had this   4. Flatus:  Ongoing, following with GI now at her request -says she is trying to eat less chocolate and reports that someone else got into her chocolate snack that was empty in the trash can  Family/ staff Communication: discussed with snf nurse  Labs/tests ordered:  No new today

## 2016-05-07 DIAGNOSIS — I5032 Chronic diastolic (congestive) heart failure: Secondary | ICD-10-CM | POA: Diagnosis not present

## 2016-05-07 DIAGNOSIS — I639 Cerebral infarction, unspecified: Secondary | ICD-10-CM | POA: Diagnosis not present

## 2016-05-07 DIAGNOSIS — R278 Other lack of coordination: Secondary | ICD-10-CM | POA: Diagnosis not present

## 2016-05-07 DIAGNOSIS — I6350 Cerebral infarction due to unspecified occlusion or stenosis of unspecified cerebral artery: Secondary | ICD-10-CM | POA: Diagnosis not present

## 2016-05-07 DIAGNOSIS — M6281 Muscle weakness (generalized): Secondary | ICD-10-CM | POA: Diagnosis not present

## 2016-05-09 DIAGNOSIS — I639 Cerebral infarction, unspecified: Secondary | ICD-10-CM | POA: Diagnosis not present

## 2016-05-09 DIAGNOSIS — I6350 Cerebral infarction due to unspecified occlusion or stenosis of unspecified cerebral artery: Secondary | ICD-10-CM | POA: Diagnosis not present

## 2016-05-09 DIAGNOSIS — I5032 Chronic diastolic (congestive) heart failure: Secondary | ICD-10-CM | POA: Diagnosis not present

## 2016-05-09 DIAGNOSIS — M6281 Muscle weakness (generalized): Secondary | ICD-10-CM | POA: Diagnosis not present

## 2016-05-09 DIAGNOSIS — R278 Other lack of coordination: Secondary | ICD-10-CM | POA: Diagnosis not present

## 2016-05-12 DIAGNOSIS — I6350 Cerebral infarction due to unspecified occlusion or stenosis of unspecified cerebral artery: Secondary | ICD-10-CM | POA: Diagnosis not present

## 2016-05-12 DIAGNOSIS — I639 Cerebral infarction, unspecified: Secondary | ICD-10-CM | POA: Diagnosis not present

## 2016-05-12 DIAGNOSIS — I5032 Chronic diastolic (congestive) heart failure: Secondary | ICD-10-CM | POA: Diagnosis not present

## 2016-05-12 DIAGNOSIS — R278 Other lack of coordination: Secondary | ICD-10-CM | POA: Diagnosis not present

## 2016-05-12 DIAGNOSIS — M6281 Muscle weakness (generalized): Secondary | ICD-10-CM | POA: Diagnosis not present

## 2016-05-16 DIAGNOSIS — I639 Cerebral infarction, unspecified: Secondary | ICD-10-CM | POA: Diagnosis not present

## 2016-05-16 DIAGNOSIS — M6281 Muscle weakness (generalized): Secondary | ICD-10-CM | POA: Diagnosis not present

## 2016-05-16 DIAGNOSIS — I5032 Chronic diastolic (congestive) heart failure: Secondary | ICD-10-CM | POA: Diagnosis not present

## 2016-05-16 DIAGNOSIS — R278 Other lack of coordination: Secondary | ICD-10-CM | POA: Diagnosis not present

## 2016-05-16 DIAGNOSIS — I6350 Cerebral infarction due to unspecified occlusion or stenosis of unspecified cerebral artery: Secondary | ICD-10-CM | POA: Diagnosis not present

## 2016-05-20 ENCOUNTER — Non-Acute Institutional Stay (SKILLED_NURSING_FACILITY): Payer: Medicare Other | Admitting: Internal Medicine

## 2016-05-20 ENCOUNTER — Encounter: Payer: Self-pay | Admitting: Internal Medicine

## 2016-05-20 DIAGNOSIS — I69391 Dysphagia following cerebral infarction: Secondary | ICD-10-CM

## 2016-05-20 DIAGNOSIS — F329 Major depressive disorder, single episode, unspecified: Secondary | ICD-10-CM

## 2016-05-20 DIAGNOSIS — F32A Depression, unspecified: Secondary | ICD-10-CM

## 2016-05-20 DIAGNOSIS — S81801D Unspecified open wound, right lower leg, subsequent encounter: Secondary | ICD-10-CM | POA: Diagnosis not present

## 2016-05-20 DIAGNOSIS — S81811D Laceration without foreign body, right lower leg, subsequent encounter: Secondary | ICD-10-CM

## 2016-05-20 NOTE — Progress Notes (Signed)
Patient ID: Sonya [name], female   DOB: 10/08/1913, 82 y.o.   MRN: [id number]   Location:  [facility] Room Number: 112 Place of Service:  SNF (31) Provider:  Mathias Bogacki L. Mariea Clonts, D.O., C.M.D.  Hollace Kinnier, DO  Patient Care Team: Gayland Curry, DO as PCP - General (Geriatric Medicine) Well [place], MD as Consulting Physician (Cardiology) [name], NP as Nurse Practitioner (Obstetrics and Gynecology)  Extended Emergency Contact Information Primary Emergency Contact: Lowe,Susan Address: [address]          [place] [address] [name] of [place] Phone: [phone] Relation: Friend Secondary Emergency Contact: Joyce,Steve Address: [address]          [place], [place] [address] [name] of [place] Phone: [phone] Work Phone: [phone] Mobile Phone: [phone] Relation: Friend  Code Status:  DNR Goals of care: Advanced Directive information Advanced Directives 05/20/2016  Does patient have an advance directive? Yes  Type of Advance Directive Out of facility DNR (pink MOST or yellow form);[place];Living will  Copy of advanced directive(s) in chart? Yes  Pre-existing out of facility DNR order (yellow form or pink MOST form) Yellow form placed in chart (order not valid for inpatient use)     Chief Complaint  Patient presents with  . Acute Visit    leg concerns, discuss zoloft    HPI:  Pt is a 82 y.o. female seen today for an acute visit for continued concern about her right leg lateral to her current abrasion which is slowly healing and staff concerns about her "biting off the heads" of staff members that work with her regularly despite her zoloft she's on for her depression.  She worries about the area on her leg b/c she once had a biopsy there that was benign (also by her report).  She reports being lonely and not enough people visiting her.  She does  have a friend coming to see her soon from [place].    Past Medical History  Diagnosis Date  . Acute bronchitis   . Asthma   . TIA (transient ischemic attack)   . Herpes zoster complication 123456    complicated, left eye involvement  . Nontoxic uninodular goiter     negative [place] 2008  . Macular degeneration (senile) of retina, unspecified 2012    wet  . Acute myocardial infarction, unspecified site, episode of care unspecified 03/07/11  . Coronary atherosclerosis of native coronary artery      s/p stent diagonal branch 1996; RCA 03/07/11  . Chronic diastolic heart failure ([place])   . Cardiomegaly   . Diaphragmatic hernia without mention of obstruction or gangrene   . Irritable bowel syndrome   . Other specified genital prolapse(618.89)     corrected with pessary  . Edema 2006  . Other abnormal blood chemistry   . Debility, unspecified   . Open wound of knee, leg (except thigh), and ankle, without mention of complication 0000000    left leg, complicated wound, extended healing, resolved 01/2013  . Transient ischemic attack (TIA), and cerebral infarction without residual deficits 2008  . Shoulder pain 03/30/2013  . Hypertension   . Depression   . Finger numbness 12/28/2013    Left thumb, 1, 2nd fingers  . Dysphagia S/P CVA (cerebrovascular accident) 02/14/2014  . CHF (congestive heart failure) ([place])   . Stroke ([place])   . Chronic diastolic heart failure ([place]) 01/31/2014    01/2014: 2d  echo: EF 0000000, grade 1 diastolic dysfunction    Past Surgical History  Procedure Laterality Date  . Appendectomy    . Tonsillectomy and adenoidectomy    . Cataract extraction    . Cardiac catheterization  1996, 2001, 2012  . Carotid stent      diagonal branch of LCA  . Coronary angioplasty      Allergies  Allergen Reactions  . Crestor [Rosuvastatin] Other (See Comments)    Leg Weakness   . Ace Inhibitors Cough  . Plavix [Clopidogrel] Nausea And Vomiting      Medication List       This list  is accurate as of: 05/20/16  2:26 PM.  Always use your most recent med list.               antiseptic oral rinse Liqd  15 mLs by Mouth Rinse route at bedtime.     fluocinolone 0.01 % external solution  Commonly known as:  SYNALAR  Apply 1-2 application topically 2 (two) times daily.     furosemide 20 MG tablet  Commonly known as:  LASIX  Take 20 mg by mouth daily.     hydrocortisone cream 1 %  Mix with mositurelle cream then apply small amount to affected areas as needed for rash     iron polysaccharides 150 MG capsule  Commonly known as:  NIFEREX  Take 150 mg by mouth 2 (two) times daily. One capsule each morning     losartan 50 MG tablet  Commonly known as:  COZAAR  Take 50 mg by mouth daily.     LOTEMAX 0.5 % Gel  Generic drug:  Loteprednol Etabonate  Apply 0.5 inches to eye 2 (two) times daily. Left eye     metoprolol succinate 25 MG 24 hr tablet  Commonly known as:  TOPROL-XL  Take 25 mg by mouth daily.     NEXIUM 40 MG capsule  Generic drug:  esomeprazole  Take 40 mg by mouth daily.     nitroGLYCERIN 0.4 MG SL tablet  Commonly known as:  NITROSTAT  Place 0.4 mg under the tongue every 5 (five) minutes as needed for chest pain.     OPTIVE 0.5-0.9 % Soln  Generic drug:  Carboxymethylcellul-Glycerin  Place 1 drop into the right eye. Three times daily     PRESERVISION AREDS 2 PO  Take 1 capsule by mouth 2 (two) times daily.     sertraline 50 MG tablet  Commonly known as:  ZOLOFT  Take 75 mg by mouth daily.     Simethicone 250 MG Caps  Take by mouth 2 (two) times daily as needed (gas).     simvastatin 20 MG tablet  Commonly known as:  ZOCOR  Take 20 mg by mouth daily.     triamcinolone 0.025 % cream  Commonly known as:  KENALOG  Apply 1 application topically 2 (two) times daily.     UCERIS 9 MG Tb24  Generic drug:  Budesonide  Take 1 capsule by mouth daily.        Review of Systems  Constitutional: Negative for fever and chills.  HENT:  Positive for congestion and rhinorrhea.   Eyes: Positive for visual disturbance.  Respiratory: Positive for cough. Negative for shortness of breath.        Reports an episode of choking/coughing up frothy mucus--has continued to cough since she says  Cardiovascular: Negative for chest pain, palpitations and leg swelling.  Gastrointestinal: Negative for abdominal pain and abdominal  distention.  Genitourinary: Negative for dysuria.  Musculoskeletal: Positive for arthralgias.       Aching in her feet since she is no longer exercising or using them; has some hammertoes, deformities  Neurological: Positive for weakness. Negative for dizziness.  Psychiatric/Behavioral: Negative for confusion. The patient is not nervous/anxious.        Denies depression, but admits to loneliness    Immunization History  Administered Date(s) Administered  . Influenza Whole 09/26/2013  . Influenza-Unspecified 09/26/2014, 10/04/2015  . Pneumococcal Polysaccharide-23 12/22/2008  . Tdap 10/13/2012   Pertinent  Health Maintenance Due  Topic Date Due  . DEXA SCAN  10/25/1980  . PNA vac Low Risk Adult (2 of 2 - PCV13) 12/22/2009  . INFLUENZA VACCINE  07/22/2016   Fall Risk  10/08/2015 09/28/2013  Falls in the past year? No No  Risk for fall due to : History of fall(s) History of fall(s)  Risk for fall due to (comments): - 10/13 fell out of bed   Functional Status Survey:    Filed Vitals:   05/20/16 1405  BP: 133/70  Pulse: 78  Temp: 98.1 F (36.7 C)  TempSrc: Oral  Resp: 20  SpO2: 95%   There is no weight on file to calculate BMI. Physical Exam  Constitutional: She is oriented to person, place, and time. She appears well-developed and well-nourished. No distress.  Cardiovascular: Normal rate, regular rhythm and normal heart sounds.   Pulmonary/Chest: Effort normal and breath sounds normal. No respiratory distress. She has no wheezes. She has no rales.  Abdominal: Soft. Bowel sounds are normal.    Musculoskeletal: Normal range of motion.  Uses power chair to get around  Neurological: She is alert and oriented to person, place, and time.  Skin: Skin is warm and dry.  Two open areas--upper with steristrips in place and lower laceration with nonadherent dressing and tape; tenderness lateral to the distal wound (with the dressing)  Psychiatric: She has a normal mood and affect.  Very pleasant and appropriate with me    Labs reviewed:  Recent Labs  07/12/15 11/07/15 1131  NA 140 139  K 4.6 4.3  CL  --  108  CO2  --  24  GLUCOSE  --  107*  BUN 23* 21*  CREATININE 0.8 0.88  CALCIUM  --  8.6*   No results for input(s): AST, ALT, ALKPHOS, BILITOT, PROT, ALBUMIN in the last 8760 hours.  Recent Labs  11/07/15 1131 11/16/15 01/18/16 04/25/16 0900  WBC 10.3 8.8 8.9 8.8  NEUTROABS 6.7  --   --   --   HGB 7.2* 6.5* 6.6* 8.2*  HCT 23.0* 20* 21* 29*  MCV 92.0  --   --   --   PLT 445* 445* 413* 445*   Lab Results  Component Value Date   TSH 0.67 02/23/2015   Lab Results  Component Value Date   HGBA1C 6.1* 02/01/2014   Lab Results  Component Value Date   CHOL 145 02/23/2015   HDL 45 02/23/2015   LDLCALC 82 02/23/2015   TRIG 141 02/23/2015   CHOLHDL 3.4 02/01/2014    Assessment/Plan 1. Skin tear of right lower leg without complication, subsequent encounter -no signs of infection -doing well and area next to lower wound does not appear concerning to me and counseled pt not to worry about it  2. Depression -cont current dose of zoloft for now  3. Dysphagia S/P CVA (cerebrovascular accident) -reports recent episode of cough and possible aspiration -says  that ST no longer had anything to offer her but I will check with Lyn about this  Family/ staff Communication: discussed with nursing staff  Labs/tests ordered:  Will discuss with ST

## 2016-05-21 DIAGNOSIS — D500 Iron deficiency anemia secondary to blood loss (chronic): Secondary | ICD-10-CM | POA: Diagnosis not present

## 2016-05-21 DIAGNOSIS — A09 Infectious gastroenteritis and colitis, unspecified: Secondary | ICD-10-CM | POA: Diagnosis not present

## 2016-05-21 LAB — CBC AND DIFFERENTIAL
HEMATOCRIT: 29 % — AB (ref 36–46)
HEMOGLOBIN: 9.2 g/dL — AB (ref 12.0–16.0)
PLATELETS: 486 10*3/uL — AB (ref 150–399)
WBC: 9.4 10*3/mL

## 2016-05-21 LAB — HEPATIC FUNCTION PANEL
ALT: 12 U/L (ref 7–35)
AST: 16 U/L (ref 13–35)
Alkaline Phosphatase: 87 U/L (ref 25–125)
BILIRUBIN, TOTAL: 0.2 mg/dL

## 2016-05-21 LAB — BASIC METABOLIC PANEL
BUN: 20 mg/dL (ref 4–21)
Creatinine: 0.7 mg/dL (ref 0.5–1.1)
GLUCOSE: 80 mg/dL
Potassium: 4.8 mmol/L (ref 3.4–5.3)
SODIUM: 140 mmol/L (ref 137–147)

## 2016-05-30 DIAGNOSIS — D500 Iron deficiency anemia secondary to blood loss (chronic): Secondary | ICD-10-CM | POA: Diagnosis not present

## 2016-06-03 ENCOUNTER — Other Ambulatory Visit: Payer: Self-pay | Admitting: Gastroenterology

## 2016-06-03 DIAGNOSIS — D500 Iron deficiency anemia secondary to blood loss (chronic): Secondary | ICD-10-CM

## 2016-06-12 ENCOUNTER — Ambulatory Visit
Admission: RE | Admit: 2016-06-12 | Discharge: 2016-06-12 | Disposition: A | Discharge status: Home / Self Care | Payer: Medicare Other | Source: Ambulatory Visit | Attending: Gastroenterology | Admitting: Gastroenterology

## 2016-06-12 ENCOUNTER — Other Ambulatory Visit: Payer: BLUE CROSS/BLUE SHIELD

## 2016-06-12 DIAGNOSIS — D500 Iron deficiency anemia secondary to blood loss (chronic): Secondary | ICD-10-CM

## 2016-06-12 DIAGNOSIS — K449 Diaphragmatic hernia without obstruction or gangrene: Secondary | ICD-10-CM | POA: Diagnosis not present

## 2016-06-12 MED ORDER — IOPAMIDOL (ISOVUE-300) INJECTION 61%
100.0000 mL | Freq: Once | INTRAVENOUS | Status: AC | PRN
  Administered 2016-06-12: 100 mL via INTRAVENOUS

## 2016-06-16 DIAGNOSIS — A09 Infectious gastroenteritis and colitis, unspecified: Secondary | ICD-10-CM | POA: Diagnosis not present

## 2016-06-16 DIAGNOSIS — D500 Iron deficiency anemia secondary to blood loss (chronic): Secondary | ICD-10-CM | POA: Diagnosis not present

## 2016-06-16 DIAGNOSIS — R933 Abnormal findings on diagnostic imaging of other parts of digestive tract: Secondary | ICD-10-CM | POA: Diagnosis not present

## 2016-06-20 ENCOUNTER — Non-Acute Institutional Stay (SKILLED_NURSING_FACILITY): Payer: Medicare Other | Admitting: Adult Health

## 2016-06-20 DIAGNOSIS — K6389 Other specified diseases of intestine: Secondary | ICD-10-CM | POA: Diagnosis not present

## 2016-06-20 DIAGNOSIS — D500 Iron deficiency anemia secondary to blood loss (chronic): Secondary | ICD-10-CM

## 2016-06-20 DIAGNOSIS — K219 Gastro-esophageal reflux disease without esophagitis: Secondary | ICD-10-CM

## 2016-06-20 DIAGNOSIS — J45909 Unspecified asthma, uncomplicated: Secondary | ICD-10-CM | POA: Diagnosis not present

## 2016-06-20 DIAGNOSIS — Z7189 Other specified counseling: Secondary | ICD-10-CM

## 2016-06-20 NOTE — Progress Notes (Signed)
Patient ID: Sonya [name], female   DOB: 23-Jun-1913, 82 y.o.   MRN: [id number]   Location:   [facility]   Place of Service:  SNF (31) Provider:   Cindi Carbon, ANP [facility] [phone]   Hollace Kinnier, DO  Patient Care Team: Gayland Curry, DO as PCP - General (Geriatric Medicine) Well [place], MD as Consulting Physician (Cardiology) [name], NP as Nurse Practitioner (Obstetrics and Gynecology)  Extended Emergency Contact Information Primary Emergency Contact: Lowe,Susan Address: [address]          [place] [address] [name] of [place] Phone: [phone] Relation: Friend Secondary Emergency Contact: Joyce,Steve Address: [address]          [place], [place] [address] [name] of [place] Phone: [phone] Work Phone: [phone] Mobile Phone: [phone] Relation: Friend  Code Status: DNR Goals of care: Advanced Directive information Advanced Directives 06/20/2016  Does patient have an advance directive? Yes  Type of Paramedic of [place];Living will;Out of facility DNR (pink MOST or yellow form)  Copy of advanced directive(s) in chart? Yes  Pre-existing out of facility DNR order (yellow form or pink MOST form) -     Chief Complaint  Patient presents with  . Medical Management of Chronic Issues    HPI:  Pt is a 82 y.o. female seen today for medical management of chronic diseases. Weight has decreased by 5 lbs in the past month. VS stable. Appetite the same, sleeping well.  She had CT scan of the abd done on 6/22 showing a lesion in the ascending colon just above the ileocecal valve consistent with colon cancer. She has a hx of heme pos stool and anemia.  Originally we had decided not to work this up due to the fact that she was 89 years and lived in skilled care.Later she made an apt with Dr Watt Climes due to gas and loose stools. She was placed on steroids and  improved to a degree.  Her decision regarding to this mass is not proceed with further work up.    Anemia: H/H stable 8.2/28.7 on 04/25/16, on nu iron bid  GERD: remains on nexium without symptoms, has a large hiatal hernia  Asthma: has intermittent cough and chronic rhinorrhea, has not needed albuterol.  Was on advair at one time but it seemed to make it worse so it was stopped     Past Medical History  Diagnosis Date  . Acute bronchitis   . Asthma   . TIA (transient ischemic attack)   . Herpes zoster complication 123456    complicated, left eye involvement  . Nontoxic uninodular goiter     negative [place] 2008  . Macular degeneration (senile) of retina, unspecified 2012    wet  . Acute myocardial infarction, unspecified site, episode of care unspecified 03/07/11  . Coronary atherosclerosis of native coronary artery      s/p stent diagonal branch 1996; RCA 03/07/11  . Chronic diastolic heart failure ([place])   . Cardiomegaly   . Diaphragmatic hernia without mention of obstruction or gangrene   . Irritable bowel syndrome   . Other specified genital prolapse(618.89)     corrected with pessary  . Edema 2006  . Other abnormal blood chemistry   . Debility, unspecified   . Open wound of knee, leg (except thigh), and ankle, without mention of complication 0000000    left leg, complicated wound, extended healing, resolved 01/2013  .  Transient ischemic attack (TIA), and cerebral infarction without residual deficits 2008  . Shoulder pain 03/30/2013  . Hypertension   . Depression   . Finger numbness 12/28/2013    Left thumb, 1, 2nd fingers  . Dysphagia S/P CVA (cerebrovascular accident) 02/14/2014  . CHF (congestive heart failure) ([place])   . Stroke ([place])   . Chronic diastolic heart failure ([place]) 01/31/2014    01/2014: 2d echo: EF 0000000, grade 1 diastolic dysfunction    Past Surgical History  Procedure Laterality Date  . Appendectomy    . Tonsillectomy and adenoidectomy    . Cataract extraction     . Cardiac catheterization  1996, 2001, 2012  . Carotid stent      diagonal branch of LCA  . Coronary angioplasty      Allergies  Allergen Reactions  . Crestor [Rosuvastatin] Other (See Comments)    Leg Weakness   . Ace Inhibitors Cough  . Plavix [Clopidogrel] Nausea And Vomiting      Medication List       This list is accurate as of: 06/20/16 11:43 AM.  Always use your most recent med list.               antiseptic oral rinse Liqd  15 mLs by Mouth Rinse route at bedtime.     fluocinolone 0.01 % external solution  Commonly known as:  SYNALAR  Apply 1-2 application topically 2 (two) times daily.     furosemide 20 MG tablet  Commonly known as:  LASIX  Take 20 mg by mouth daily.     hydrocortisone cream 1 %  Mix with mositurelle cream then apply small amount to affected areas as needed for rash     iron polysaccharides 150 MG capsule  Commonly known as:  NIFEREX  Take 150 mg by mouth 2 (two) times daily. One capsule each morning     losartan 50 MG tablet  Commonly known as:  COZAAR  Take 50 mg by mouth daily.     LOTEMAX 0.5 % Gel  Generic drug:  Loteprednol Etabonate  Apply 0.5 inches to eye 2 (two) times daily. Left eye     metoprolol succinate 25 MG 24 hr tablet  Commonly known as:  TOPROL-XL  Take 25 mg by mouth daily.     NEXIUM 40 MG capsule  Generic drug:  esomeprazole  Take 40 mg by mouth daily.     nitroGLYCERIN 0.4 MG SL tablet  Commonly known as:  NITROSTAT  Place 0.4 mg under the tongue every 5 (five) minutes as needed for chest pain.     OPTIVE 0.5-0.9 % Soln  Generic drug:  Carboxymethylcellul-Glycerin  Place 1 drop into the right eye. Three times daily     PRESERVISION AREDS 2 PO  Take 1 capsule by mouth 2 (two) times daily.     sertraline 50 MG tablet  Commonly known as:  ZOLOFT  Take 75 mg by mouth daily.     Simethicone 250 MG Caps  Take by mouth 2 (two) times daily as needed (gas).     simvastatin 20 MG tablet  Commonly  known as:  ZOCOR  Take 20 mg by mouth daily.     triamcinolone 0.025 % cream  Commonly known as:  KENALOG  Apply 1 application topically 2 (two) times daily.     UCERIS 9 MG Tb24  Generic drug:  Budesonide  Take 1 capsule by mouth daily.        Review of Systems  Constitutional: Negative for fever, chills, diaphoresis, activity change, appetite change, fatigue and unexpected weight change.  HENT: Positive for rhinorrhea. Negative for congestion.   Respiratory: Positive for cough. Negative for shortness of breath and wheezing.   Cardiovascular: Negative for chest pain, palpitations and leg swelling.  Gastrointestinal: Negative for nausea, vomiting, abdominal pain, diarrhea, constipation, blood in stool, abdominal distention, anal bleeding and rectal pain.  Genitourinary: Negative for dysuria and difficulty urinating.  Musculoskeletal: Positive for arthralgias and gait problem. Negative for myalgias, back pain and joint swelling.  Neurological: Negative for dizziness, tremors, seizures, syncope, facial asymmetry, speech difficulty, weakness, light-headedness, numbness and headaches.  Psychiatric/Behavioral: Negative for behavioral problems, confusion and agitation.    Immunization History  Administered Date(s) Administered  . Influenza Whole 09/26/2013  . Influenza-Unspecified 09/26/2014, 10/04/2015  . Pneumococcal Polysaccharide-23 12/22/2008  . Tdap 10/13/2012   Pertinent  Health Maintenance Due  Topic Date Due  . DEXA SCAN  10/25/1980  . PNA vac Low Risk Adult (2 of 2 - PCV13) 12/22/2009  . INFLUENZA VACCINE  07/22/2016   Fall Risk  10/08/2015 09/28/2013  Falls in the past year? No No  Risk for fall due to : History of fall(s) History of fall(s)  Risk for fall due to (comments): - 10/13 fell out of bed   Functional Status Survey:    Filed Vitals:   06/20/16 1139  BP: 127/58  Pulse: 70  Temp: 97.9 F (36.6 C)  Resp: 18  Weight: 133 lb 3.2 oz (60.419 kg)   Body  mass index is 26.01 kg/(m^2).  Wt Readings from Last 3 Encounters:  06/20/16 133 lb 3.2 oz (60.419 kg)  05/06/16 138 lb (62.596 kg)  03/18/16 139 lb 14.4 oz (63.458 kg)    Physical Exam  Constitutional: She is oriented to person, place, and time. No distress.  HENT:  Head: Normocephalic and atraumatic.  Mouth/Throat: Oropharynx is clear and moist. No oropharyngeal exudate.  Clear nasal drainage  Eyes: Conjunctivae are normal. Right eye exhibits no discharge. Left eye exhibits no discharge.  Opacity of left cornea  Neck: No JVD present. No tracheal deviation present. No thyromegaly present.  Cardiovascular: Normal rate and regular rhythm.   No murmur heard. Pulmonary/Chest: Effort normal and breath sounds normal. No respiratory distress. She has no wheezes.  Abdominal: Soft. Bowel sounds are normal. She exhibits no distension and no mass. There is no tenderness. There is no rebound and no guarding.  Lymphadenopathy:    She has no cervical adenopathy.  Neurological: She is alert and oriented to person, place, and time.  Skin: Skin is warm and dry. She is not diaphoretic.  Psychiatric: She has a normal mood and affect.  Nursing note and vitals reviewed.   Labs reviewed:  Recent Labs  11/07/15 1131 03/20/16 1057 05/21/16  NA 139 143 140  K 4.3 4.3 4.8  CL 108  --   --   CO2 24  --   --   GLUCOSE 107*  --   --   BUN 21* 20 20  CREATININE 0.88 0.7 0.7  CALCIUM 8.6*  --   --     Recent Labs  05/21/16  AST 16  ALT 12  ALKPHOS 87    Recent Labs  11/07/15 1131  03/20/16 1057 04/25/16 0900 05/21/16  WBC 10.3  < > 9.2 8.8 9.4  NEUTROABS 6.7  --   --   --   --   HGB 7.2*  < > 8.9* 8.2* 9.2*  HCT  23.0*  < > 27* 29* 29*  MCV 92.0  --   --   --   --   PLT 445*  < > 411* 445* 486*  < > = values in this interval not displayed. Lab Results  Component Value Date   TSH 0.67 02/23/2015   Lab Results  Component Value Date   HGBA1C 6.1* 02/01/2014   Lab Results    Component Value Date   CHOL 145 02/23/2015   HDL 45 02/23/2015   LDLCALC 82 02/23/2015   TRIG 141 02/23/2015   CHOLHDL 3.4 02/01/2014   03/20/16: Iron 26, TIBC 294, ferritin [id number], folic [id number]  Significant Diagnostic Results in last 30 days:  Ct Abdomen Pelvis W Contrast  06/12/2016  CLINICAL DATA:  Anemia and diarrhea.  History of IBS. EXAM: CT ABDOMEN AND PELVIS WITH CONTRAST TECHNIQUE: Multidetector CT imaging of the abdomen and pelvis was performed using the standard protocol following bolus administration of intravenous contrast. CONTRAST:  152mL ISOVUE-300 IOPAMIDOL (ISOVUE-300) INJECTION 61% COMPARISON:  CT scan from 2007. FINDINGS: Lower chest: Scarring changes but no worrisome pulmonary nodules. Large hiatal hernia. PE heart is mildly enlarged. Extensive coronary artery calcifications. Hepatobiliary: Stable small low-attenuation lesion in the dome of the right hepatic lobe. This is likely a benign cyst. No worrisome hepatic lesions to suggest metastatic disease. The gallbladder is normal. No common bile duct dilatation. Pancreas: No mass, inflammation or ductal dilatation. A small fatty cleft is noted. Spleen: Normal size.  No focal lesions. Adrenals/Urinary Tract: The adrenal glands and kidneys are unremarkable. No worrisome renal lesions or hydronephrosis. No renal or ureteral calculi. No bladder abnormality. Stomach/Bowel: The stomach, duodenum and small bowel are unremarkable. A good portion of the stomach is up in the chest. There is a circumferential soft tissue mass involving the ascending colon just above the ileocecal valve consistent with an apple-core type adenocarcinoma of the colon. No adjacent adenopathy. Vascular/Lymphatic: Moderate atherosclerotic calcifications involving the aorta but no aneurysm or dissection. The branch vessels are patent. The major venous structures are patent. No mesenteric or retroperitoneal mass or adenopathy. Other: The uterus and ovaries are normal. A  pessary ring is noted in the vagina. The bladder is normal. No pelvic mass or adenopathy. No free pelvic fluid collections. No inguinal mass or adenopathy. Musculoskeletal: No significant bony findings. Advanced degenerative changes involving the spine. IMPRESSION: Circumferential apple-core type lesion involving the ascending colon just above the ileocecal valve consistent with colon cancer. No findings for local adenopathy or metastatic disease involving the liver. Large hiatal hernia. Advanced atherosclerotic calcifications involving the aorta and coronary arteries. These results will be called to the ordering clinician or representative by the Radiologist Assistant, and communication documented in the PACS or zVision Dashboard. Electronically Signed   By: Marijo Sanes M.D.   On: 06/12/2016 15:27    Assessment/Plan  1. Colonic mass -followed by Dr. Watt Climes -No further work up per resident, which I agree with, given her hx and age  37. Anemia due to chronic blood loss -continue Nu Iron -monitor CBC  3. Chronic asthma, unspecified asthma severity, uncomplicated Stable with no albuterol use She declined anything for the rhinorrhea which may be contributing to her   4. Gastroesophageal reflux disease without esophagitis Stable Continue nexium  5. Advanced care planning/counseling discussion I agree with her goals of care as comfort based, however, when I attempted to go over a most form she continues to want to be sent to the hospital for acute issues. It  seems that she is still absorbing this diagnosis and needs more time. We have mentioned several times over the past year and half that her anemia may be due to colon ca but she does not remembered this. She is her own POA but needs continued education and support in this area.    Family/ staff Communication: discussed with resident  Labs/tests ordered:  NA  Cindi Carbon, [place] [phone]

## 2016-07-03 DIAGNOSIS — H353212 Exudative age-related macular degeneration, right eye, with inactive choroidal neovascularization: Secondary | ICD-10-CM | POA: Diagnosis not present

## 2016-07-03 DIAGNOSIS — Z961 Presence of intraocular lens: Secondary | ICD-10-CM | POA: Diagnosis not present

## 2016-07-03 DIAGNOSIS — H18422 Band keratopathy, left eye: Secondary | ICD-10-CM | POA: Diagnosis not present

## 2016-07-17 DIAGNOSIS — D500 Iron deficiency anemia secondary to blood loss (chronic): Secondary | ICD-10-CM | POA: Diagnosis not present

## 2016-07-17 DIAGNOSIS — R933 Abnormal findings on diagnostic imaging of other parts of digestive tract: Secondary | ICD-10-CM | POA: Diagnosis not present

## 2016-07-17 DIAGNOSIS — A09 Infectious gastroenteritis and colitis, unspecified: Secondary | ICD-10-CM | POA: Diagnosis not present

## 2016-07-21 ENCOUNTER — Other Ambulatory Visit (HOSPITAL_COMMUNITY): Payer: Self-pay | Admitting: *Deleted

## 2016-07-22 ENCOUNTER — Ambulatory Visit (HOSPITAL_COMMUNITY)
Admission: RE | Admit: 2016-07-22 | Discharge: 2016-07-22 | Disposition: A | Discharge status: Home / Self Care | Payer: Medicare Other | Source: Ambulatory Visit | Attending: Gastroenterology | Admitting: Gastroenterology

## 2016-07-22 DIAGNOSIS — D500 Iron deficiency anemia secondary to blood loss (chronic): Secondary | ICD-10-CM | POA: Diagnosis not present

## 2016-07-22 MED ORDER — SODIUM CHLORIDE 0.9 % IV SOLN
510.0000 mg | Freq: Once | INTRAVENOUS | Status: DC
  Filled 2016-07-22: qty 17

## 2016-07-29 ENCOUNTER — Encounter: Payer: Self-pay | Admitting: Internal Medicine

## 2016-07-29 ENCOUNTER — Non-Acute Institutional Stay (SKILLED_NURSING_FACILITY): Payer: Medicare Other | Admitting: Internal Medicine

## 2016-07-29 DIAGNOSIS — D500 Iron deficiency anemia secondary to blood loss (chronic): Secondary | ICD-10-CM | POA: Diagnosis not present

## 2016-07-29 DIAGNOSIS — H353290 Exudative age-related macular degeneration, unspecified eye, stage unspecified: Secondary | ICD-10-CM | POA: Diagnosis not present

## 2016-07-29 DIAGNOSIS — C189 Malignant neoplasm of colon, unspecified: Secondary | ICD-10-CM | POA: Diagnosis not present

## 2016-07-29 DIAGNOSIS — N8110 Cystocele, unspecified: Secondary | ICD-10-CM

## 2016-07-29 DIAGNOSIS — Z23 Encounter for immunization: Secondary | ICD-10-CM

## 2016-07-29 DIAGNOSIS — F329 Major depressive disorder, single episode, unspecified: Secondary | ICD-10-CM | POA: Diagnosis not present

## 2016-07-29 DIAGNOSIS — K5900 Constipation, unspecified: Secondary | ICD-10-CM | POA: Diagnosis not present

## 2016-07-29 DIAGNOSIS — F32A Depression, unspecified: Secondary | ICD-10-CM

## 2016-08-04 DIAGNOSIS — C189 Malignant neoplasm of colon, unspecified: Secondary | ICD-10-CM | POA: Insufficient documentation

## 2016-08-04 NOTE — Progress Notes (Signed)
Location:   [place] [facility] Number: [id number] Place of Service:  SNF (31) Provider:  Dior Dominik L. Mariea Clonts, D.O., C.M.D.  Hollace Kinnier, DO  Patient Care Team: Gayland Curry, DO as PCP - General (Geriatric Medicine) Well [place], MD as Consulting Physician (Cardiology) [name], NP as Nurse Practitioner (Obstetrics and Gynecology)  Extended Emergency Contact Information Primary Emergency Contact: Lowe,Susan Address: [address]          [place] [address] [name] of [place] Phone: [phone] Relation: Friend Secondary Emergency Contact: Joyce,Steve Address: [address]          [place], [place] [address] [name] of [place] Phone: [phone] Work Phone: [phone] Mobile Phone: [phone] Relation: Friend  Code Status:  DNR Goals of care: Advanced Directive information Advanced Directives 07/29/2016  Does patient have an advance directive? Yes  Type of Advance Directive Out of facility DNR (pink MOST or yellow form);[place];Living will  Does patient want to make changes to advanced directive? -  Copy of advanced directive(s) in chart? Yes  Pre-existing out of facility DNR order (yellow form or pink MOST form) Yellow form placed in chart (order not valid for inpatient use)     Chief Complaint  Patient presents with  . Medical Management of Chronic Issues    routine visit    HPI:  Pt is a 82 y.o. female seen today for medical management of chronic diseases.  Since I last saw her, she's been following with Dr. Watt Climes and was diagnosed with a colon mass (classic apple core lesion on her CT abd/pelvis) in her ascending colon.   Despite this, she is doing well.  She had an iron infusion 07/22/16.  Her CEA was 9.8 on 8/1.  She still mentions discomfort on her right anterior shin, but there are no remaining open areas.  There remains a suspicion of a past DVT on a portable image that was  gone when she saw interventional radiology.  This seems even more likely now that she has a malignancy.  She enjoys people visiting her.  Her vision continues to decline.    Past Medical History:  Diagnosis Date  . Acute bronchitis   . Acute myocardial infarction, unspecified site, episode of care unspecified 03/07/11  . Asthma   . Cardiomegaly   . CHF (congestive heart failure) ([place])   . Chronic diastolic heart failure ([place])   . Chronic diastolic heart failure ([place]) 01/31/2014   01/2014: 2d echo: EF 0000000, grade 1 diastolic dysfunction   . Coronary atherosclerosis of native coronary artery     s/p stent diagonal branch 1996; RCA 03/07/11  . Debility, unspecified   . Depression   . Diaphragmatic hernia without mention of obstruction or gangrene   . Dysphagia S/P CVA (cerebrovascular accident) 02/14/2014  . Edema 2006  . Finger numbness 12/28/2013   Left thumb, 1, 2nd fingers  . Herpes zoster complication 123456   complicated, left eye involvement  . Hypertension   . Irritable bowel syndrome   . Macular degeneration (senile) of retina, unspecified 2012   wet  . Nontoxic uninodular goiter    negative [place] 2008  . Open wound of knee, leg (except thigh), and ankle, without mention of complication 0000000   left leg, complicated wound, extended healing, resolved 01/2013  . Other abnormal blood chemistry   . Other specified genital prolapse(618.89)    corrected with pessary  . Shoulder pain 03/30/2013  . Stroke (  [place])   . TIA (transient ischemic attack)   . Transient ischemic attack (TIA), and cerebral infarction without residual deficits 2008   Past Surgical History:  Procedure Laterality Date  . APPENDECTOMY    . [place], 2001, 2012  . CAROTID STENT     diagonal branch of LCA  . CATARACT EXTRACTION    . CORONARY ANGIOPLASTY    . TONSILLECTOMY AND ADENOIDECTOMY      Allergies  Allergen Reactions  . Crestor [Rosuvastatin] Other (See Comments)    Leg Weakness     . Ace Inhibitors Cough  . Plavix [Clopidogrel] Nausea And Vomiting      Medication List       Accurate as of 07/29/16 11:59 PM. Always use your most recent med list.          antiseptic oral rinse Liqd 15 mLs by Mouth Rinse route at bedtime.   budesonide 3 MG 24 hr capsule Commonly known as:  ENTOCORT EC Take 3 mg by mouth daily.   fluocinolone 0.01 % external solution Commonly known as:  SYNALAR Apply 1-2 application topically 2 (two) times daily.   furosemide 20 MG tablet Commonly known as:  LASIX Take 20 mg by mouth daily.   hydrocortisone cream 1 % Mix with mositurelle cream then apply small amount to affected areas as needed for rash   iron polysaccharides 150 MG capsule Commonly known as:  NIFEREX Take 150 mg by mouth 2 (two) times daily. One capsule each morning   losartan 50 MG tablet Commonly known as:  COZAAR Take 50 mg by mouth daily.   LOTEMAX 0.5 % Gel Generic drug:  Loteprednol Etabonate Apply 0.5 inches to eye 2 (two) times daily. Left eye   metoprolol succinate 25 MG 24 hr tablet Commonly known as:  TOPROL-XL Take 25 mg by mouth daily.   NEXIUM 40 MG capsule Generic drug:  esomeprazole Take 40 mg by mouth daily.   nitroGLYCERIN 0.4 MG SL tablet Commonly known as:  NITROSTAT Place 0.4 mg under the tongue every 5 (five) minutes as needed for chest pain.   OPTIVE 0.5-0.9 % Soln Generic drug:  Carboxymethylcellul-Glycerin Place 1 drop into the right eye. Three times daily   PRESERVISION AREDS 2 PO Take 1 capsule by mouth 2 (two) times daily.   sertraline 50 MG tablet Commonly known as:  ZOLOFT Take 75 mg by mouth daily.   Simethicone 250 MG Caps Take by mouth 2 (two) times daily as needed (gas).   simvastatin 20 MG tablet Commonly known as:  ZOCOR Take 20 mg by mouth daily.   triamcinolone 0.025 % cream Commonly known as:  KENALOG Apply 1 application topically 2 (two) times daily.       Review of Systems  Constitutional:  Positive for malaise/fatigue. Negative for chills and fever.  HENT: Positive for hearing loss.   Eyes: Positive for blurred vision.  Respiratory: Negative for cough and shortness of breath.   Cardiovascular: Negative for chest pain and leg swelling.  Gastrointestinal: Negative for abdominal pain, blood in stool, constipation, diarrhea and melena.       Denies flatus too recently with current meds  Genitourinary: Negative for dysuria.  Musculoskeletal: Negative for falls.       Right anterior leg soreness  Skin: Negative for rash.  Neurological: Positive for weakness. Negative for dizziness and headaches.  Psychiatric/Behavioral: Positive for depression. Negative for memory loss.       Seems better lately    Immunization  History  Administered Date(s) Administered  . Influenza Whole 09/26/2013  . Influenza-Unspecified 09/26/2014, 10/04/2015  . Pneumococcal Polysaccharide-23 12/22/2008  . Tdap 10/13/2012   Pertinent  Health Maintenance Due  Topic Date Due  . DEXA SCAN  10/25/1980  . PNA vac Low Risk Adult (2 of 2 - PCV13) 12/22/2009  . INFLUENZA VACCINE  07/22/2016   Fall Risk  06/20/2016 10/08/2015 09/28/2013  Falls in the past year? No No No  Risk for fall due to : - History of fall(s) History of fall(s)  Risk for fall due to (comments): - - 10/13 fell out of bed   Functional Status Survey:    Vitals:   07/29/16 1415  BP: 126/66  Pulse: 78  Resp: 18  Temp: 97.7 F (36.5 C)  TempSrc: Oral  SpO2: 95%  Weight: 144 lb (65.3 kg)   Body mass index is 28.12 kg/m. Physical Exam  Constitutional: She is oriented to person, place, and time. She appears well-developed and well-nourished. No distress.  HENT:  HOH  Eyes:  Glasses, vision remains poor  Cardiovascular: Normal rate, regular rhythm, normal heart sounds and intact distal pulses.   Pulmonary/Chest: Effort normal and breath sounds normal.  Abdominal: Soft. Bowel sounds are normal. She exhibits no distension. There  is no tenderness.  Musculoskeletal: Normal range of motion.  Uses power chair to get around  Neurological: She is alert and oriented to person, place, and time.  Skin: Skin is warm and dry. There is pallor.    Labs reviewed:  Recent Labs  11/07/15 1131 03/20/16 1057 05/21/16  NA 139 143 140  K 4.3 4.3 4.8  CL 108  --   --   CO2 24  --   --   GLUCOSE 107*  --   --   BUN 21* 20 20  CREATININE 0.88 0.7 0.7  CALCIUM 8.6*  --   --     Recent Labs  05/21/16  AST 16  ALT 12  ALKPHOS 87    Recent Labs  11/07/15 1131  03/20/16 1057 04/25/16 0900 05/21/16  WBC 10.3  < > 9.2 8.8 9.4  NEUTROABS 6.7  --   --   --   --   HGB 7.2*  < > 8.9* 8.2* 9.2*  HCT 23.0*  < > 27* 29* 29*  MCV 92.0  --   --   --   --   PLT 445*  < > 411* 445* 486*  < > = values in this interval not displayed. Lab Results  Component Value Date   TSH 0.67 02/23/2015   Lab Results  Component Value Date   HGBA1C 6.1 (H) 02/01/2014   Lab Results  Component Value Date   CHOL 145 02/23/2015   HDL 45 02/23/2015   LDLCALC 82 02/23/2015   TRIG 141 02/23/2015   CHOLHDL 3.4 02/01/2014    Significant Diagnostic Results in last 30 days:  CT abd/pelvis reviewed from 06/12/16  Assessment/Plan 1. Adenocarcinoma, colon ([place]) -opted for palliative care and avoiding surgery and other interventions at 82 yo -bowel symptoms are improved recently  2. Constipation, unspecified constipation type -stable with current regimen  3. Female bladder prolapse -continues with pessary changed by gyn  4. AMD (age-related macular degeneration), wet ([place]) -advanced, vision quite poor and limits her activities  -cont current therapy  5. Anemia due to chronic blood loss -did have iron infusion 8/1, cont to monitor, following with Dr. Watt Climes  6. Depression -cont zoloft which has helped her  spirits considerably  7.  Need for 13:  prevnar order written and pt consented  Family/ staff Communication: discussed with SNF  nursing  Labs/tests ordered:  Just prevnar vaccine   Kriston Mckinnie L. Ameli Sangiovanni, D.O. [facility] [address], [place] [address] Cell Phone (Mon-Fri 8am-5pm):  [phone] On Call:  [phone] & follow prompts after 5pm & weekends Office Phone:  [phone] Office Fax:  [phone]

## 2016-08-15 ENCOUNTER — Encounter: Payer: Self-pay | Admitting: Cardiology

## 2016-08-22 ENCOUNTER — Non-Acute Institutional Stay (SKILLED_NURSING_FACILITY): Payer: Medicare Other | Admitting: Adult Health

## 2016-08-22 ENCOUNTER — Encounter: Payer: Self-pay | Admitting: Adult Health

## 2016-08-22 DIAGNOSIS — H6122 Impacted cerumen, left ear: Secondary | ICD-10-CM | POA: Diagnosis not present

## 2016-08-22 DIAGNOSIS — R6884 Jaw pain: Secondary | ICD-10-CM | POA: Diagnosis not present

## 2016-08-22 NOTE — Progress Notes (Signed)
Patient ID: Sonya [name], female   DOB: [birth year], 82 y.o.   MRN: [id number]  Location:   [facility]   Place of Service:  SNF (31) Provider:   Cindi Carbon, ANP [facility] [phone]   Hollace Kinnier, DO  Patient Care Team: Gayland Curry, DO as PCP - General (Geriatric Medicine) Well [place], MD as Consulting Physician (Cardiology) [name], NP as Nurse Practitioner (Obstetrics and Gynecology)  Extended Emergency Contact Information Primary Emergency Contact: Lowe,Susan Address: [address]          [place] [address] [name] of [place] Phone: [phone] Relation: Friend Secondary Emergency Contact: Joyce,Steve Address: [address]          [place], [place] [address] [name] of [place] Phone: [phone] Work Phone: [phone] Mobile Phone: [phone] Relation: Friend  Code Status:  DNR Goals of care: Advanced Directive information Advanced Directives 07/29/2016  Does patient have an advance directive? Yes  Type of Advance Directive Out of facility DNR (pink MOST or yellow form);[place];Living will  Does patient want to make changes to advanced directive? -  Copy of advanced directive(s) in chart? Yes  Pre-existing out of facility DNR order (yellow form or pink MOST form) Yellow form placed in chart (order not valid for inpatient use)     Chief Complaint  Patient presents with  . Acute Visit    left jaw pain    HPI:  Pt is a 82 y.o. female seen today for an acute visit for left jaw pain noted since 8/31 after bending back to have her hair washed in the beauty shop.  No displacement was noted but pain is noted when her mouth is opened wide at times and sometimes at rest.  No recent hx of cold symptoms. No fever. No ear pain. No chest pain.  Denies dental pain. Due for dental cleaning later this month.   Past Medical History:  Diagnosis Date  . Acute bronchitis    . Acute myocardial infarction, unspecified site, episode of care unspecified 03/07/11  . Asthma   . Cardiomegaly   . CHF (congestive heart failure) ([place])   . Chronic diastolic heart failure ([place])   . Chronic diastolic heart failure ([place]) 01/31/2014   01/2014: 2d echo: EF 0000000, grade 1 diastolic dysfunction   . Coronary atherosclerosis of native coronary artery     s/p stent diagonal branch 1996; RCA 03/07/11  . Debility, unspecified   . Depression   . Diaphragmatic hernia without mention of obstruction or gangrene   . Dysphagia S/P CVA (cerebrovascular accident) 02/14/2014  . Edema 2006  . Finger numbness 12/28/2013   Left thumb, 1, 2nd fingers  . Herpes zoster complication 123456   complicated, left eye involvement  . Hypertension   . Irritable bowel syndrome   . Macular degeneration (senile) of retina, unspecified 2012   wet  . Nontoxic uninodular goiter    negative [place] 2008  . Open wound of knee, leg (except thigh), and ankle, without mention of complication 0000000   left leg, complicated wound, extended healing, resolved 01/2013  . Other abnormal blood chemistry   . Other specified genital prolapse(618.89)    corrected with pessary  . Shoulder pain 03/30/2013  . Stroke ([place])   . TIA (transient ischemic attack)   . Transient ischemic attack (TIA), and cerebral infarction without residual deficits 2008   Past Surgical History:  Procedure Laterality Date  . APPENDECTOMY    .  [place], 2001, 2012  . CAROTID STENT     diagonal branch of LCA  . CATARACT EXTRACTION    . CORONARY ANGIOPLASTY    . TONSILLECTOMY AND ADENOIDECTOMY      Allergies  Allergen Reactions  . Crestor [Rosuvastatin] Other (See Comments)    Leg Weakness   . Ace Inhibitors Cough  . Plavix [Clopidogrel] Nausea And Vomiting      Medication List       Accurate as of 08/22/16  3:54 PM. Always use your most recent med list.          antiseptic oral rinse Liqd 15 mLs by Mouth Rinse  route at bedtime.   budesonide 3 MG 24 hr capsule Commonly known as:  ENTOCORT EC Take 3 mg by mouth daily.   fluocinolone 0.01 % external solution Commonly known as:  SYNALAR Apply 1-2 application topically 2 (two) times daily.   furosemide 20 MG tablet Commonly known as:  LASIX Take 20 mg by mouth daily.   hydrocortisone cream 1 % Mix with mositurelle cream then apply small amount to affected areas as needed for rash   iron polysaccharides 150 MG capsule Commonly known as:  NIFEREX Take 150 mg by mouth 2 (two) times daily. One capsule each morning   losartan 50 MG tablet Commonly known as:  COZAAR Take 50 mg by mouth daily.   LOTEMAX 0.5 % Gel Generic drug:  Loteprednol Etabonate Apply 0.5 inches to eye 2 (two) times daily. Left eye   metoprolol succinate 25 MG 24 hr tablet Commonly known as:  TOPROL-XL Take 25 mg by mouth daily.   NEXIUM 40 MG capsule Generic drug:  esomeprazole Take 40 mg by mouth daily.   nitroGLYCERIN 0.4 MG SL tablet Commonly known as:  NITROSTAT Place 0.4 mg under the tongue every 5 (five) minutes as needed for chest pain.   OPTIVE 0.5-0.9 % Soln Generic drug:  Carboxymethylcellul-Glycerin Place 1 drop into the right eye. Three times daily   PRESERVISION AREDS 2 PO Take 1 capsule by mouth 2 (two) times daily.   sertraline 50 MG tablet Commonly known as:  ZOLOFT Take 75 mg by mouth daily.   Simethicone 250 MG Caps Take by mouth 2 (two) times daily as needed (gas).   simvastatin 20 MG tablet Commonly known as:  ZOCOR Take 20 mg by mouth daily.   triamcinolone 0.025 % cream Commonly known as:  KENALOG Apply 1 application topically 2 (two) times daily.       Review of Systems  Constitutional: Negative for activity change, appetite change, chills, diaphoresis, fatigue, fever and unexpected weight change.  HENT: Positive for trouble swallowing. Negative for congestion, dental problem, drooling, ear discharge, ear pain, facial  swelling, hearing loss, postnasal drip, sinus pressure, tinnitus and voice change.        Left jaw pain  Respiratory: Negative for cough and shortness of breath.   Cardiovascular: Positive for leg swelling. Negative for chest pain and palpitations.  Musculoskeletal: Positive for arthralgias and gait problem. Negative for back pain, joint swelling and myalgias.    Immunization History  Administered Date(s) Administered  . Influenza Whole 09/26/2013  . Influenza-Unspecified 09/26/2014, 10/04/2015  . Pneumococcal Polysaccharide-23 12/22/2008  . Tdap 10/13/2012   Pertinent  Health Maintenance Due  Topic Date Due  . DEXA SCAN  10/25/1980  . PNA vac Low Risk Adult (2 of 2 - PCV13) 12/22/2009  . INFLUENZA VACCINE  07/22/2016   Fall Risk  06/20/2016 10/08/2015  09/28/2013  Falls in the past year? No No No  Risk for fall due to : - History of fall(s) History of fall(s)  Risk for fall due to (comments): - - 10/13 fell out of bed   Functional Status Survey:    Vitals:   08/22/16 1553  BP: 131/68  Pulse: 76  Resp: 16  Temp: 98 F (36.7 C)  SpO2: 96%   There is no height or weight on file to calculate BMI. Physical Exam  Constitutional: No distress.  HENT:  Right Ear: External ear normal.  Nose: Nose normal.  Mouth/Throat: Uvula is midline, oropharynx is clear and moist and mucous membranes are normal. No oral lesions. No trismus in the jaw. Normal dentition. No dental abscesses, uvula swelling, lacerations or dental caries. No oropharyngeal exudate.  Left ear occluded by cerumen. No tenderness on palpation of the jaw line. Jaw in proper position. No click noted.   Eyes: Pupils are equal, round, and reactive to light. Right eye exhibits no discharge. Left eye exhibits no discharge.  Lymphadenopathy:    She has no cervical adenopathy.  Skin: She is not diaphoretic.  Nursing note and vitals reviewed.   Labs reviewed:  Recent Labs  11/07/15 1131 03/20/16 1057 05/21/16  NA 139  143 140  K 4.3 4.3 4.8  CL 108  --   --   CO2 24  --   --   GLUCOSE 107*  --   --   BUN 21* 20 20  CREATININE 0.88 0.7 0.7  CALCIUM 8.6*  --   --     Recent Labs  05/21/16  AST 16  ALT 12  ALKPHOS 87    Recent Labs  11/07/15 1131  03/20/16 1057 04/25/16 0900 05/21/16  WBC 10.3  < > 9.2 8.8 9.4  NEUTROABS 6.7  --   --   --   --   HGB 7.2*  < > 8.9* 8.2* 9.2*  HCT 23.0*  < > 27* 29* 29*  MCV 92.0  --   --   --   --   PLT 445*  < > 411* 445* 486*  < > = values in this interval not displayed. Lab Results  Component Value Date   TSH 0.67 02/23/2015   Lab Results  Component Value Date   HGBA1C 6.1 (H) 02/01/2014   Lab Results  Component Value Date   CHOL 145 02/23/2015   HDL 45 02/23/2015   LDLCALC 82 02/23/2015   TRIG 141 02/23/2015   CHOLHDL 3.4 02/01/2014    Significant Diagnostic Results in last 30 days:  No results found.  Assessment/Plan  1. Pain in bone of jaw Not dislocated, no signs of dental issues Does have cerumen impaction which may be causing referred pain.  Not able to visualized the TM.  Recommended ice/heat whichever feels best.  Tylenol for pain. Avoid nsaids due to hx of colon mass/GIB.  2. Cerumen impaction left -implement facility protocol for debrox gtts and irrigation -report if pain does not improved    Family/ staff Communication: discussed with resident  Labs/tests ordered:  NA

## 2016-09-01 ENCOUNTER — Encounter: Payer: Self-pay | Admitting: Cardiology

## 2016-09-01 ENCOUNTER — Ambulatory Visit (INDEPENDENT_AMBULATORY_CARE_PROVIDER_SITE_OTHER): Payer: Medicare Other | Admitting: Cardiology

## 2016-09-01 VITALS — BP 134/64 | HR 70 | Ht 60.0 in | Wt 140.0 lb

## 2016-09-01 DIAGNOSIS — I2583 Coronary atherosclerosis due to lipid rich plaque: Principal | ICD-10-CM

## 2016-09-01 DIAGNOSIS — Z8673 Personal history of transient ischemic attack (TIA), and cerebral infarction without residual deficits: Secondary | ICD-10-CM | POA: Diagnosis not present

## 2016-09-01 DIAGNOSIS — I2510 Atherosclerotic heart disease of native coronary artery without angina pectoris: Secondary | ICD-10-CM

## 2016-09-01 DIAGNOSIS — I10 Essential (primary) hypertension: Secondary | ICD-10-CM | POA: Diagnosis not present

## 2016-09-01 DIAGNOSIS — I5032 Chronic diastolic (congestive) heart failure: Secondary | ICD-10-CM

## 2016-09-01 DIAGNOSIS — D649 Anemia, unspecified: Secondary | ICD-10-CM

## 2016-09-01 NOTE — Progress Notes (Addendum)
[facility]. [address], [place]  [address] Phone: [phone] Fax:  [phone]  Date:  09/01/2016   ID:  CLEVELAND WONSER, DOB 31-Oct-1913, MRN [id number]  PCP:  Hollace Kinnier, DO   History of Present Illness: Sonya [name] is a 82 y.o. female with coronary artery disease status post ST elevation myocardial infarction receiving an RCA stent with residual moderate LAD disease and obtuse marginal disease here for followup.  She had an episode of hemoglobin 6.5. She states that she was asymptomatic. 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 Dr. Watt Climes saw her GI, colonic mass.   She's not having any chest pain, no shortness of breath. She gets around in her electric chair. Lives at [facility].   CAD-stable with no exertional anginal symptoms. She will occasionally have fleeting chest discomfort, this is usually relieved by burping. In the past her chest discomfort post catheterization/stent placement was relieved with Nexium. I will stop isosorbide. She can call if symptoms arise.   Stroke-unfortunately suffered a stroke in early 2015. Dr. Leonie Man. Dysarthria has improved slightly. Dysphagia has improved. Left-sided facial droop has improved. Aspirin has been stopped because of bleeding.   Hyperlipidemia-in the past, she has not tolerated statins due to leg weakness.  GERD-doing well with Nexium. Seen Dr. Lamonte Sakai for chronic aspiration. Occasionally will have wheezing.  Inez Catalina her friend here with her.     Wt Readings from Last 3 Encounters:  09/01/16 140 lb (63.5 kg)  07/29/16 144 lb (65.3 kg)  07/22/16 140 lb (63.5 kg)     Past Medical History:  Diagnosis Date  . Acute bronchitis   . Acute myocardial infarction, unspecified site, episode of care unspecified 03/07/11  . Asthma   . Cardiomegaly   . CHF (congestive heart failure) ([place])   . Chronic diastolic heart failure ([place])   . Chronic diastolic heart failure ([place]) 01/31/2014   01/2014:  2d echo: EF 0000000, grade 1 diastolic dysfunction   . Coronary atherosclerosis of native coronary artery     s/p stent diagonal branch 1996; RCA 03/07/11  . Debility, unspecified   . Depression   . Diaphragmatic hernia without mention of obstruction or gangrene   . Dysphagia S/P CVA (cerebrovascular accident) 02/14/2014  . Edema 2006  . Finger numbness 12/28/2013   Left thumb, 1, 2nd fingers  . Herpes zoster complication 123456   complicated, left eye involvement  . Hypertension   . Irritable bowel syndrome   . Macular degeneration (senile) of retina, unspecified 2012   wet  . Nontoxic uninodular goiter    negative [place] 2008  . Open wound of knee, leg (except thigh), and ankle, without mention of complication 0000000   left leg, complicated wound, extended healing, resolved 01/2013  . Other abnormal blood chemistry   . Other specified genital prolapse(618.89)    corrected with pessary  . Shoulder pain 03/30/2013  . Stroke ([place])   . TIA (transient ischemic attack)   . Transient ischemic attack (TIA), and cerebral infarction without residual deficits 2008    Past Surgical History:  Procedure Laterality Date  . APPENDECTOMY    . [place], 2001, 2012  . CAROTID STENT     diagonal branch of LCA  . CATARACT EXTRACTION    . CORONARY ANGIOPLASTY    . TONSILLECTOMY AND ADENOIDECTOMY      Current Outpatient Prescriptions  Medication Sig Dispense Refill  . acetaminophen (TYLENOL) 500 MG  tablet Take 1,000 mg by mouth every 8 (eight) hours as needed for mild pain or moderate pain.    Marland Kitchen antiseptic oral rinse (BIOTENE) LIQD 15 mLs by Mouth Rinse route at bedtime.    . budesonide (ENTOCORT EC) 3 MG 24 hr capsule Take 3 mg by mouth daily.    . Carboxymethylcellul-Glycerin (OPTIVE) 0.5-0.9 % SOLN Place 1 drop into the right eye. Three times daily    . esomeprazole (NEXIUM) 40 MG capsule Take 40 mg by mouth daily.    . fluocinolone (SYNALAR) 0.01 % external solution Apply 1-2  application topically 2 (two) times daily.    . furosemide (LASIX) 20 MG tablet Take 20 mg by mouth daily.    . hydrocortisone cream 1 % Mix with mositurelle cream then apply small amount to affected areas as needed for rash    . iron polysaccharides (NIFEREX) 150 MG capsule Take 150 mg by mouth 2 (two) times daily. One capsule each morning    . losartan (COZAAR) 50 MG tablet Take 50 mg by mouth daily.    . Loteprednol Etabonate (LOTEMAX) 0.5 % GEL Apply 0.5 inches to eye 2 (two) times daily. Left eye    . metoprolol succinate (TOPROL-XL) 25 MG 24 hr tablet Take 25 mg by mouth daily.    . Multiple Vitamins-Minerals (PRESERVISION AREDS 2 PO) Take 1 capsule by mouth 2 (two) times daily.    . nitroGLYCERIN (NITROSTAT) 0.4 MG SL tablet Place 0.4 mg under the tongue every 5 (five) minutes as needed for chest pain.     Marland Kitchen sertraline (ZOLOFT) 50 MG tablet Take 75 mg by mouth daily.     . Simethicone 250 MG CAPS Take by mouth 2 (two) times daily as needed (gas).    . simvastatin (ZOCOR) 20 MG tablet Take 20 mg by mouth daily.    Marland Kitchen triamcinolone (KENALOG) 0.025 % cream Apply 1 application topically 2 (two) times daily.     No current facility-administered medications for this visit.     Allergies:    Allergies  Allergen Reactions  . Crestor [Rosuvastatin] Other (See Comments)    Leg Weakness   . Ace Inhibitors Cough  . Plavix [Clopidogrel] Nausea And Vomiting    Social History:  The patient  reports that she quit smoking about 52 years ago. She has never used smokeless tobacco. She reports that she drinks about 3.0 oz of alcohol per week . She reports that she does not use drugs.   ROS:  Please see the history of present illness.   Denies any fevers, chills. Recent leg wound. Occasional rare chest pain.   PHYSICAL EXAM: VS:  BP 134/64   Pulse 70   Ht 5' (1.524 m)   Wt 140 lb (63.5 kg)   BMI 27.34 kg/m  Well nourished, well developed, in no acute distress elderly, in motorized  wheelchair. HEENT: normal  Neck: no JVD  Cardiac:  normal S1, S2; RRR; no murmur  Lungs:  clear to auscultation bilaterally, no wheezing, rhonchi or rales  Abd: soft, nontender, no hepatomegaly  Ext: no edema  Skin: Right leg improved no edema.  Neuro: Very minimal left facial droop. Dysarthria noted.  EKG:  EKG was ordered today 09/01/16-sinus rhythm, 70, nonspecific ST-T wave changes. Personally viewed-05/01/15-sinus rhythm, 73, old inferior infarct pattern, nonspecific T-wave changes-prior Normal rhythm 63, T wave inversion inferior leads mostly but scattered throughout EKG.     ASSESSMENT AND PLAN:  1. Severe anemia-hemoglobin 6. 5-2 units packed red  blood cells. Source is a colonic mass. Dr. Watt Climes. Obviously, she would be high risk for any sort of surgical procedure. 2. Stroke-01/2014- symptom was trouble speaking, dysarthria. Dysphagia and facial droop have improved. Aspirin has been stopped because of anemia incident where hemoglobin was 7.4. She received transfusion.. Seen Dr. Lamonte Sakai for aspiration pneumonia. Thickened liquids. Stopped Advair and this seemed to help as well. 3. Coronary artery disease status post MI-overall doing well. Aspirin stopped because of severe anemia. On simvastatin. Lipids have been excellent.  4. Hypertension-improved. Stable, doing well.  5. Chronic diastolic heart failure-doing well, stable. No shortness of breath. 6. See her back in 6 months  Signed, Candee Furbish, MD [facility]  09/01/2016 5:15 PM

## 2016-09-01 NOTE — Patient Instructions (Signed)

## 2016-09-22 ENCOUNTER — Encounter: Payer: Self-pay | Admitting: Adult Health

## 2016-09-22 ENCOUNTER — Non-Acute Institutional Stay (SKILLED_NURSING_FACILITY): Payer: Medicare Other | Admitting: Adult Health

## 2016-09-22 DIAGNOSIS — I679 Cerebrovascular disease, unspecified: Secondary | ICD-10-CM

## 2016-09-22 DIAGNOSIS — J3489 Other specified disorders of nose and nasal sinuses: Secondary | ICD-10-CM | POA: Diagnosis not present

## 2016-09-22 DIAGNOSIS — I5032 Chronic diastolic (congestive) heart failure: Secondary | ICD-10-CM

## 2016-09-22 DIAGNOSIS — K449 Diaphragmatic hernia without obstruction or gangrene: Secondary | ICD-10-CM

## 2016-09-22 DIAGNOSIS — I10 Essential (primary) hypertension: Secondary | ICD-10-CM

## 2016-09-22 DIAGNOSIS — J4520 Mild intermittent asthma, uncomplicated: Secondary | ICD-10-CM

## 2016-09-22 DIAGNOSIS — K529 Noninfective gastroenteritis and colitis, unspecified: Secondary | ICD-10-CM | POA: Diagnosis not present

## 2016-09-22 DIAGNOSIS — G3184 Mild cognitive impairment, so stated: Secondary | ICD-10-CM | POA: Diagnosis not present

## 2016-09-22 DIAGNOSIS — D500 Iron deficiency anemia secondary to blood loss (chronic): Secondary | ICD-10-CM

## 2016-09-22 NOTE — Progress Notes (Signed)
Patient ID: Sonya [name], female   DOB: 15-Apr-1913, 82 y.o.   MRN: [id number]  Location:  [place]:  SNF (31) Provider:   Cindi Carbon, ANP [place] [phone]   REED, Jonelle Sidle, DO  Patient Care Team: Gayland Curry, DO as PCP - General (Geriatric Medicine) Well Foley, MD as Consulting Physician (Cardiology) [name], NP as Nurse Practitioner (Obstetrics and Gynecology)  Extended Emergency Contact Information Primary Emergency Contact: Lowe,Susan Address: [address]          [place] [address] [name] of [place] Phone: [phone] Relation: Friend Secondary Emergency Contact: Joyce,Steve Address: [address]          [place], [place] [address] [name] of [place] Phone: [phone] Work Phone: [phone] Mobile Phone: [phone] Relation: Friend  Code Status:  DNR Goals of care: Advanced Directive information Advanced Directives 07/29/2016  Does patient have an advance directive? Yes  Type of Advance Directive Out of facility DNR (pink MOST or yellow form);[place];Living will  Does patient want to make changes to advanced directive? -  Copy of advanced directive(s) in chart? Yes  Pre-existing out of facility DNR order (yellow form or pink MOST form) Yellow form placed in chart (order not valid for inpatient use)     Chief Complaint  Patient presents with  . Medical Management of Chronic Issues    HPI:  Pt is a 82 y.o. female seen today for medical management of chronic diseases. resides in memory care due to weakness and decreased mobility. Remains quite alert and oriented despite her age. MMSE 26/30, poor recall. Hx a hx of colon mass followed by Dr. Watt Climes with no intervention due to her age.  She is being treated for colitis with budesonide. Denies abd pain diarrhea, constipation, blood in stools. Maintains her weight  at 144 lbs.   Reports a chronic cough and chronic runny nose. No fever, sob, or sputum production. VS stable. Was told at one time that she had asthma and was on advair which was stopped due to cough. She has dysphagia due to a previous CVA.  Currently on a regular diet with thin liquids. Signed a waiver for this as she was recommended thick liquids but has not had any recent bouts of pna.    Past Medical History:  Diagnosis Date  . Acute bronchitis   . Acute myocardial infarction, unspecified site, episode of care unspecified 03/07/11  . Asthma   . Cardiomegaly   . CHF (congestive heart failure) ([place])   . Chronic diastolic heart failure ([place])   . Chronic diastolic heart failure ([place]) 01/31/2014   01/2014: 2d echo: EF 0000000, grade 1 diastolic dysfunction   . Coronary atherosclerosis of native coronary artery     s/p stent diagonal branch 1996; RCA 03/07/11  . Debility, unspecified   . Depression   . Diaphragmatic hernia without mention of obstruction or gangrene   . Dysphagia S/P CVA (cerebrovascular accident) 02/14/2014  . Edema 2006  . Finger numbness 12/28/2013   Left thumb, 1, 2nd fingers  . Herpes zoster complication 123456   complicated, left eye involvement  . Hypertension   . Irritable bowel syndrome   . Macular degeneration (senile) of retina, unspecified 2012   wet  . Nontoxic uninodular goiter    negative [place] 2008  . Open wound of knee, leg (except thigh), and ankle, without mention of complication 0000000  left leg, complicated wound, extended healing, resolved 01/2013  . Other abnormal blood chemistry   . Other specified genital prolapse(618.89)    corrected with pessary  . Shoulder pain 03/30/2013  . Stroke ([place])   . TIA (transient ischemic attack)   . Transient ischemic attack (TIA), and cerebral infarction without residual deficits(V12.54) 2008   Past Surgical History:  Procedure Laterality Date  . APPENDECTOMY    . [place], 2001, 2012  .  CAROTID STENT     diagonal branch of LCA  . CATARACT EXTRACTION    . CORONARY ANGIOPLASTY    . TONSILLECTOMY AND ADENOIDECTOMY      Allergies  Allergen Reactions  . Crestor [Rosuvastatin] Other (See Comments)    Leg Weakness   . Ace Inhibitors Cough  . Plavix [Clopidogrel] Nausea And Vomiting      Medication List       Accurate as of 09/22/16  3:13 PM. Always use your most recent med list.          acetaminophen 500 MG tablet Commonly known as:  TYLENOL Take 1,000 mg by mouth every 8 (eight) hours as needed for mild pain or moderate pain.   antiseptic oral rinse Liqd 15 mLs by Mouth Rinse route at bedtime.   budesonide 3 MG 24 hr capsule Commonly known as:  ENTOCORT EC Take 3 mg by mouth daily.   fluocinolone 0.01 % external solution Commonly known as:  SYNALAR Apply 1-2 application topically 2 (two) times daily.   furosemide 20 MG tablet Commonly known as:  LASIX Take 20 mg by mouth daily.   hydrocortisone cream 1 % Mix with mositurelle cream then apply small amount to affected areas as needed for rash   iron polysaccharides 150 MG capsule Commonly known as:  NIFEREX Take 150 mg by mouth daily. One capsule each morning   losartan 50 MG tablet Commonly known as:  COZAAR Take 50 mg by mouth daily.   LOTEMAX 0.5 % Gel Generic drug:  Loteprednol Etabonate Apply 0.5 inches to eye 2 (two) times daily. Left eye   metoprolol succinate 25 MG 24 hr tablet Commonly known as:  TOPROL-XL Take 25 mg by mouth daily.   NEXIUM 40 MG capsule Generic drug:  esomeprazole Take 40 mg by mouth daily.   nitroGLYCERIN 0.4 MG SL tablet Commonly known as:  NITROSTAT Place 0.4 mg under the tongue every 5 (five) minutes as needed for chest pain.   OPTIVE 0.5-0.9 % Soln Generic drug:  Carboxymethylcellul-Glycerin Place 1 drop into the right eye. Three times daily   PRESERVISION AREDS 2 PO Take 1 capsule by mouth 2 (two) times daily.   sertraline 50 MG tablet Commonly  known as:  ZOLOFT Take 75 mg by mouth daily.   Simethicone 250 MG Caps Take by mouth 2 (two) times daily as needed (gas).   simvastatin 20 MG tablet Commonly known as:  ZOCOR Take 20 mg by mouth daily.   triamcinolone 0.025 % cream Commonly known as:  KENALOG Apply 1 application topically 2 (two) times daily.       Review of Systems  Constitutional: Negative for activity change, appetite change, chills, diaphoresis, fatigue, fever and unexpected weight change.  HENT: Positive for rhinorrhea. Negative for congestion.   Respiratory: Positive for cough. Negative for shortness of breath and wheezing.   Cardiovascular: Negative for chest pain, palpitations and leg swelling.  Gastrointestinal: Negative for abdominal distention, abdominal pain, constipation and diarrhea.  Genitourinary: Negative for difficulty urinating  and dysuria.  Musculoskeletal: Positive for arthralgias and gait problem. Negative for back pain, joint swelling and myalgias.  Neurological: Negative for dizziness, tremors, seizures, syncope, facial asymmetry, speech difficulty, weakness, light-headedness, numbness and headaches.  Psychiatric/Behavioral: Negative for agitation, behavioral problems, confusion and sleep disturbance.    Immunization History  Administered Date(s) Administered  . Influenza Whole 09/26/2013  . Influenza-Unspecified 09/26/2014, 10/04/2015  . Pneumococcal Polysaccharide-23 12/22/2008  . Tdap 10/13/2012   Pertinent  Health Maintenance Due  Topic Date Due  . DEXA SCAN  10/25/1980  . PNA vac Low Risk Adult (2 of 2 - PCV13) 12/22/2009  . INFLUENZA VACCINE  07/22/2016   Fall Risk  06/20/2016 10/08/2015 09/28/2013  Falls in the past year? No No No  Risk for fall due to : - History of fall(s) History of fall(s)  Risk for fall due to (comments): - - 10/13 fell out of bed   Functional Status Survey:    Vitals:   09/22/16 1422  BP: (!) 141/74  Pulse: 77  Resp: 16  Temp: 97.8 F (36.6 C)   SpO2: 98%  Weight: 144 lb 3.2 oz (65.4 kg)   Body mass index is 28.16 kg/m. Physical Exam  Constitutional: She is oriented to person, place, and time. No distress.  HENT:  Head: Normocephalic and atraumatic.  Right Ear: External ear normal.  Left Ear: External ear normal.  Mouth/Throat: Oropharynx is clear and moist. No oropharyngeal exudate.  Clear drainage to both nares  Neck: No JVD present.  Cardiovascular: Normal rate and regular rhythm.   No murmur heard. Pulmonary/Chest: Effort normal and breath sounds normal. No respiratory distress. She has no wheezes.  Abdominal: Soft. Bowel sounds are normal.  Neurological: She is alert and oriented to person, place, and time.  Left facial droop  Skin: Skin is warm and dry. She is not diaphoretic.  Psychiatric: She has a normal mood and affect.  Nursing note and vitals reviewed.   Labs reviewed:  Recent Labs  11/07/15 1131 03/20/16 1057 05/21/16  NA 139 143 140  K 4.3 4.3 4.8  CL 108  --   --   CO2 24  --   --   GLUCOSE 107*  --   --   BUN 21* 20 20  CREATININE 0.88 0.7 0.7  CALCIUM 8.6*  --   --     Recent Labs  05/21/16  AST 16  ALT 12  ALKPHOS 87    Recent Labs  11/07/15 1131  03/20/16 1057 04/25/16 0900 05/21/16  WBC 10.3  < > 9.2 8.8 9.4  NEUTROABS 6.7  --   --   --   --   HGB 7.2*  < > 8.9* 8.2* 9.2*  HCT 23.0*  < > 27* 29* 29*  MCV 92.0  --   --   --   --   PLT 445*  < > 411* 445* 486*  < > = values in this interval not displayed. Lab Results  Component Value Date   TSH 0.67 02/23/2015   Lab Results  Component Value Date   HGBA1C 6.1 (H) 02/01/2014   Lab Results  Component Value Date   CHOL 145 02/23/2015   HDL 45 02/23/2015   LDLCALC 82 02/23/2015   TRIG 141 02/23/2015   CHOLHDL 3.4 02/01/2014    Significant Diagnostic Results in last 30 days:  No results found.  Assessment/Plan 1. Rhinorrhea Declined medication for this issue which is long standing  2. Cough -Chronic,  multifactorial  -  not compliant with dysphagia diet -cough related to meals  3. Mild cognitive impairment with memory loss Stable, would not benefit from memory meds  4. Essential hypertension Controlled Continue Cozaar 50 mg qd  5. Anemia due to chronic blood loss Check CBC Continue Nu iron 1 tab QD  6. Hiatal hernia Continue Nexium 40 mg qd  7. Chronic diastolic heart failure (HCC) Stable, recently seen by Dr. Marlou Porch  8. Cerebrovascular disease Check lipids BP controlled Not on aspirin therapy due to colonic mass and anemia  9. Noninfectious colitis Continue budesonide per GI (Dr. Watt Climes) Denies symptoms, check A1C while on steriods  Family/ staff Communication: discussed with resident  Labs/tests ordered:  CBC, Lipid panel, A1C (check while on entocort

## 2016-09-22 NOTE — Progress Notes (Signed)
This encounter was created in error - please disregard.

## 2016-09-23 DIAGNOSIS — D649 Anemia, unspecified: Secondary | ICD-10-CM | POA: Diagnosis not present

## 2016-09-23 DIAGNOSIS — E785 Hyperlipidemia, unspecified: Secondary | ICD-10-CM | POA: Diagnosis not present

## 2016-09-23 DIAGNOSIS — Z79899 Other long term (current) drug therapy: Secondary | ICD-10-CM | POA: Diagnosis not present

## 2016-09-23 LAB — LIPID PANEL
Cholesterol: 150 mg/dL (ref 0–200)
HDL: 54 mg/dL (ref 35–70)
LDL Cholesterol: 65 mg/dL
Triglycerides: 151 mg/dL (ref 40–160)

## 2016-09-23 LAB — HEMOGLOBIN A1C: Hemoglobin A1C: 5.7

## 2016-09-23 LAB — CBC AND DIFFERENTIAL
HCT: 22 % — AB (ref 36–46)
Hemoglobin: 7.3 g/dL — AB (ref 12.0–16.0)
Platelets: 473 10*3/uL — AB (ref 150–399)
WBC: 9.7 10^3/mL

## 2016-10-03 ENCOUNTER — Non-Acute Institutional Stay (SKILLED_NURSING_FACILITY): Payer: Medicare Other | Admitting: Adult Health

## 2016-10-03 DIAGNOSIS — R053 Chronic cough: Secondary | ICD-10-CM

## 2016-10-03 DIAGNOSIS — R05 Cough: Secondary | ICD-10-CM

## 2016-10-03 DIAGNOSIS — R1312 Dysphagia, oropharyngeal phase: Secondary | ICD-10-CM | POA: Diagnosis not present

## 2016-10-03 DIAGNOSIS — J3089 Other allergic rhinitis: Secondary | ICD-10-CM

## 2016-10-03 NOTE — Progress Notes (Signed)
Location:  Occupational psychologist of Service:  SNF (31) Provider:   Cindi Carbon, ANP [place] [phone]   REED, Jonelle Sidle, DO  Patient Care Team: Gayland Curry, DO as PCP - General (Geriatric Medicine) Well [place], MD as Consulting Physician (Cardiology) [name], NP as Nurse Practitioner (Obstetrics and Gynecology)  Extended Emergency Contact Information Primary Emergency Contact: Lowe,Susan Address: [address]          [place] [address] [name] of [place] Phone: [phone] Relation: Friend Secondary Emergency Contact: Joyce,Steve Address: [address]          [place], [place] [address] [name] of [place] Phone: [phone] Work Phone: [phone] Mobile Phone: [phone] Relation: Friend  Code Status:  DNR Goals of care: Advanced Directive information Advanced Directives 07/29/2016  Does patient have an advance directive? Yes  Type of Advance Directive Out of facility DNR (pink MOST or yellow form);[place];Living will  Does patient want to make changes to advanced directive? -  Copy of advanced directive(s) in chart? Yes  Pre-existing out of facility DNR order (yellow form or pink MOST form) Yellow form placed in chart (order not valid for inpatient use)     Chief Complaint  Patient presents with  . Acute Visit    chronic cough    HPI:  Pt is a 82 y.o. female seen today for an acute visit for chronic cough. She reports that this had been going on for "years" but is worse the past few months. She was thinking of seeing Dr. Lamonte Sakai with pulmonary. By her account he had her on advair which helped her cough. In review of the records this is inaccurate and he actually stopped the Advair to help with her cough, which it was documented that it did. She has a hx of asthma but denies any sob or cough at night. Her cough is usually dry and can be  associated with meals but can also occur any time of day. She does not use albuterol.  No recent bouts of pna.   She has a hx of chronic rhinorrhea but has refused treatment for this and describes it as "an old lady drip". Has a hx of dysphagia due to CVA and has worked with speech therapy. She was recommended NTL but declined this and has signed a waiver to have thin liquids.   Past Medical History:  Diagnosis Date  . Acute bronchitis   . Acute myocardial infarction, unspecified site, episode of care unspecified 03/07/11  . Asthma   . Cardiomegaly   . CHF (congestive heart failure) ([place])   . Chronic diastolic heart failure ([place])   . Chronic diastolic heart failure ([place]) 01/31/2014   01/2014: 2d echo: EF 0000000, grade 1 diastolic dysfunction   . Coronary atherosclerosis of native coronary artery     s/p stent diagonal branch 1996; RCA 03/07/11  . Debility, unspecified   . Depression   . Diaphragmatic hernia without mention of obstruction or gangrene   . Dysphagia S/P CVA (cerebrovascular accident) 02/14/2014  . Edema 2006  . Finger numbness 12/28/2013   Left thumb, 1, 2nd fingers  . Herpes zoster complication 123456   complicated, left eye involvement  . Hypertension   . Irritable bowel syndrome   . Macular degeneration (senile) of retina, unspecified 2012   wet  . Nontoxic uninodular goiter    negative [place] 2008  . Open wound of knee, leg (  except thigh), and ankle, without mention of complication 0000000   left leg, complicated wound, extended healing, resolved 01/2013  . Other abnormal blood chemistry   . Other specified genital prolapse(618.89)    corrected with pessary  . Shoulder pain 03/30/2013  . Stroke ([place])   . TIA (transient ischemic attack)   . Transient ischemic attack (TIA), and cerebral infarction without residual deficits(V12.54) 2008   Past Surgical History:  Procedure Laterality Date  . APPENDECTOMY    . [place], 2001, 2012  . CAROTID STENT      diagonal branch of LCA  . CATARACT EXTRACTION    . CORONARY ANGIOPLASTY    . TONSILLECTOMY AND ADENOIDECTOMY      Allergies  Allergen Reactions  . Crestor [Rosuvastatin] Other (See Comments)    Leg Weakness   . Ace Inhibitors Cough  . Plavix [Clopidogrel] Nausea And Vomiting      Medication List       Accurate as of 10/03/16 11:59 PM. Always use your most recent med list.          acetaminophen 500 MG tablet Commonly known as:  TYLENOL Take 1,000 mg by mouth every 8 (eight) hours as needed for mild pain or moderate pain.   antiseptic oral rinse Liqd 15 mLs by Mouth Rinse route at bedtime.   budesonide 3 MG 24 hr capsule Commonly known as:  ENTOCORT EC Take 3 mg by mouth daily.   fluocinolone 0.01 % external solution Commonly known as:  SYNALAR Apply 1-2 application topically 2 (two) times daily.   furosemide 20 MG tablet Commonly known as:  LASIX Take 20 mg by mouth daily.   hydrocortisone cream 1 % Mix with mositurelle cream then apply small amount to affected areas as needed for rash   iron polysaccharides 150 MG capsule Commonly known as:  NIFEREX Take 150 mg by mouth daily. One capsule each morning   losartan 50 MG tablet Commonly known as:  COZAAR Take 50 mg by mouth daily.   LOTEMAX 0.5 % Gel Generic drug:  Loteprednol Etabonate Apply 0.5 inches to eye 2 (two) times daily. Left eye   metoprolol succinate 25 MG 24 hr tablet Commonly known as:  TOPROL-XL Take 25 mg by mouth daily.   NEXIUM 40 MG capsule Generic drug:  esomeprazole Take 40 mg by mouth daily.   nitroGLYCERIN 0.4 MG SL tablet Commonly known as:  NITROSTAT Place 0.4 mg under the tongue every 5 (five) minutes as needed for chest pain.   OPTIVE 0.5-0.9 % Soln Generic drug:  Carboxymethylcellul-Glycerin Place 1 drop into the right eye. Three times daily   PRESERVISION AREDS 2 PO Take 1 capsule by mouth 2 (two) times daily.   sertraline 50 MG tablet Commonly known as:   ZOLOFT Take 75 mg by mouth daily.   Simethicone 250 MG Caps Take by mouth 2 (two) times daily as needed (gas).   simvastatin 20 MG tablet Commonly known as:  ZOCOR Take 20 mg by mouth daily.   triamcinolone 0.025 % cream Commonly known as:  KENALOG Apply 1 application topically 2 (two) times daily.       Review of Systems  Immunization History  Administered Date(s) Administered  . Influenza Whole 09/26/2013  . Influenza-Unspecified 09/26/2014, 10/04/2015  . Pneumococcal Polysaccharide-23 12/22/2008  . Tdap 10/13/2012   Pertinent  Health Maintenance Due  Topic Date Due  . DEXA SCAN  10/25/1980  . PNA vac Low Risk Adult (2 of 2 - PCV13)  12/22/2009  . INFLUENZA VACCINE  07/22/2016   Fall Risk  06/20/2016 10/08/2015 09/28/2013  Falls in the past year? No No No  Risk for fall due to : - History of fall(s) History of fall(s)  Risk for fall due to (comments): - - 10/13 fell out of bed   Functional Status Survey:    There were no vitals filed for this visit. There is no height or weight on file to calculate BMI. Physical Exam  Labs reviewed:  Recent Labs  11/07/15 1131 03/20/16 1057 05/21/16  NA 139 143 140  K 4.3 4.3 4.8  CL 108  --   --   CO2 24  --   --   GLUCOSE 107*  --   --   BUN 21* 20 20  CREATININE 0.88 0.7 0.7  CALCIUM 8.6*  --   --     Recent Labs  05/21/16  AST 16  ALT 12  ALKPHOS 87    Recent Labs  11/07/15 1131  03/20/16 1057 04/25/16 0900 05/21/16  WBC 10.3  < > 9.2 8.8 9.4  NEUTROABS 6.7  --   --   --   --   HGB 7.2*  < > 8.9* 8.2* 9.2*  HCT 23.0*  < > 27* 29* 29*  MCV 92.0  --   --   --   --   PLT 445*  < > 411* 445* 486*  < > = values in this interval not displayed. Lab Results  Component Value Date   TSH 0.67 02/23/2015   Lab Results  Component Value Date   HGBA1C 6.1 (H) 02/01/2014   Lab Results  Component Value Date   CHOL 145 02/23/2015   HDL 45 02/23/2015   LDLCALC 82 02/23/2015   TRIG 141 02/23/2015   CHOLHDL  3.4 02/01/2014    Significant Diagnostic Results in last 30 days:  No results found.  Assessment/Plan 1. Chronic cough Check CXR to rule out pna, notable recent dx of colon ca (no treatment due to her age/debility) Could be due to aspiration and non compliance with modified diet. However, despite this she has done quite well and has not had any bout so pna  2. Oropharyngeal dysphagia Consider ST if resident will be compliant with recommendations, await xray results  3. Chronic non-seasonal allergic rhinitis, unspecified trigger Atrovent 2 sprays TID to both nares   Family/ staff Communication: discussed with resident  Labs/tests ordered:  CXR

## 2016-10-08 DIAGNOSIS — R933 Abnormal findings on diagnostic imaging of other parts of digestive tract: Secondary | ICD-10-CM | POA: Diagnosis not present

## 2016-10-08 DIAGNOSIS — D500 Iron deficiency anemia secondary to blood loss (chronic): Secondary | ICD-10-CM | POA: Diagnosis not present

## 2016-10-09 DIAGNOSIS — Z23 Encounter for immunization: Secondary | ICD-10-CM | POA: Diagnosis not present

## 2016-10-13 ENCOUNTER — Ambulatory Visit: Payer: Medicare Other | Admitting: Adult Health

## 2016-10-15 ENCOUNTER — Encounter: Payer: Self-pay | Admitting: Adult Health

## 2016-10-28 ENCOUNTER — Ambulatory Visit (INDEPENDENT_AMBULATORY_CARE_PROVIDER_SITE_OTHER)
Admission: RE | Admit: 2016-10-28 | Discharge: 2016-10-28 | Disposition: A | Discharge status: Home / Self Care | Payer: Medicare Other | Source: Ambulatory Visit | Attending: Emergency Medicine | Admitting: Emergency Medicine

## 2016-10-28 ENCOUNTER — Ambulatory Visit (INDEPENDENT_AMBULATORY_CARE_PROVIDER_SITE_OTHER): Payer: Medicare Other | Admitting: Emergency Medicine

## 2016-10-28 ENCOUNTER — Encounter: Payer: Self-pay | Admitting: Emergency Medicine

## 2016-10-28 DIAGNOSIS — R05 Cough: Secondary | ICD-10-CM

## 2016-10-28 DIAGNOSIS — I2583 Coronary atherosclerosis due to lipid rich plaque: Secondary | ICD-10-CM

## 2016-10-28 DIAGNOSIS — I517 Cardiomegaly: Secondary | ICD-10-CM | POA: Diagnosis not present

## 2016-10-28 DIAGNOSIS — R059 Cough, unspecified: Secondary | ICD-10-CM

## 2016-10-28 DIAGNOSIS — I2510 Atherosclerotic heart disease of native coronary artery without angina pectoris: Secondary | ICD-10-CM

## 2016-10-28 NOTE — Progress Notes (Signed)
Subjective:    Patient ID: Sonya [name], female    DOB: 1913/02/24, 82 y.o.   MRN: [id number]  HPI 82 yo woman, former smoker, hx of CAD, diastolic CHF, HTN, CVA c/b dysphagia 2/'15. She is supposed to be on a modified diet, doesn't always use the thickened liquids. Carries a hx of asthma made many years ago. She has been on Advair for many years. She coughs with swallowing. She is unsure whether she would miss the Advair. She was started on losartan   ROV 11/23/14 -- follow up visit for cough concerning for aspiration. Also considered a contribution from Advair, losartan. She was initially better after the Advair was stopped - less cough. She is having more for that last couple days for some reason. She doesn't recall any recent aspiration event. She is doing swallowing eval and strengthening exercises. She is still on losartan.   ROV 10/28/16 -- this is a follow up for chronic cough.  She continues to have aspiration episodes, causes her to get choked up. More with liquids than solids. She no longer has thickened liquids or a modified diet. She does not really do any particular swallowing precautions - states that she had been cleared to loosen those restrictions by the speech therapists. Cough is non-productive. She hears wheezes at night. Denies congestion or drainage but she does frequent throat clearing. Seemed to be helped by Vicks Rub. She is on Nexium and reflux well controlled. She was recently dx with colon CA by Dr Watt Climes by CT scan in June. She was set for CXR at [facility] but did not let them do it.    Review of Systems  Constitutional: Negative for fever and unexpected weight change.  HENT: Negative for congestion, dental problem, ear pain, nosebleeds, postnasal drip, rhinorrhea, sinus pressure, sneezing, sore throat and trouble swallowing.   Eyes: Negative for redness and itching.  Respiratory: Positive for cough, choking and wheezing. Negative for chest tightness and shortness  of breath.   Cardiovascular: Negative for palpitations and leg swelling.  Gastrointestinal: Negative for nausea and vomiting.  Genitourinary: Negative for dysuria.  Musculoskeletal: Negative for joint swelling.  Skin: Negative for rash.  Neurological: Negative for headaches.  Hematological: Does not bruise/bleed easily.  Psychiatric/Behavioral: Negative for dysphoric mood. The patient is not nervous/anxious.    Past Medical History:  Diagnosis Date  . Acute bronchitis   . Acute myocardial infarction, unspecified site, episode of care unspecified 03/07/11  . Asthma   . Cardiomegaly   . CHF (congestive heart failure) ([place])   . Chronic diastolic heart failure ([place])   . Chronic diastolic heart failure ([place]) 01/31/2014   01/2014: 2d echo: EF 0000000, grade 1 diastolic dysfunction   . Coronary atherosclerosis of native coronary artery     s/p stent diagonal branch 1996; RCA 03/07/11  . Debility, unspecified   . Depression   . Diaphragmatic hernia without mention of obstruction or gangrene   . Dysphagia S/P CVA (cerebrovascular accident) 02/14/2014  . Edema 2006  . Finger numbness 12/28/2013   Left thumb, 1, 2nd fingers  . Herpes zoster complication 123456   complicated, left eye involvement  . Hypertension   . Irritable bowel syndrome   . Macular degeneration (senile) of retina, unspecified 2012   wet  . Nontoxic uninodular goiter    negative [place] 2008  . Open wound of knee, leg (except thigh), and ankle, without mention of complication 0000000   left leg, complicated wound, extended healing, resolved 01/2013  .  Other abnormal blood chemistry   . Other specified genital prolapse(618.89)    corrected with pessary  . Shoulder pain 03/30/2013  . Stroke ([place])   . TIA (transient ischemic attack)   . Transient ischemic attack (TIA), and cerebral infarction without residual deficits(V12.54) 2008     Family History  Problem Relation Age of Onset  . Diabetes Sister   . Cancer Mother   . Stroke  Father   . Diabetes Father   . Dementia Sister      Social History   Social History  . Marital status: Widowed    Spouse name: N/A  . Number of children: 0  . Years of education: N/A   Occupational History  . 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 Smoking status: Former Smoker    Quit date: 12/23/1963  . Smokeless tobacco: Never Used  . Alcohol use 3.0 oz/week    5 Glasses of wine per week     Comment: one glass of wine at dinner   . Drug use: No  . Sexual activity: No   Other Topics Concern  . Not on file   Social History Narrative      Resides at [facility] since 2009. Retired Microbiologist    Widowed with no children.    Living Will, DNR   Exercises predominantly walking about a mile daily   Does not drive   Caffeine beverages occasional   Stopped smoking 1965   1/2 glass of wine almost daily      Allergies  Allergen Reactions  . Crestor [Rosuvastatin] Other (See Comments)    Leg Weakness   . Ace Inhibitors Cough  . Plavix [Clopidogrel] Nausea And Vomiting     Outpatient Medications Prior to Visit  Medication Sig Dispense Refill  . budesonide (ENTOCORT EC) 3 MG 24 hr capsule Take 3 mg by mouth daily.    . Carboxymethylcellul-Glycerin (OPTIVE) 0.5-0.9 % SOLN Place 1 drop into the right eye. Three times daily    . esomeprazole (NEXIUM) 40 MG capsule Take 40 mg by mouth daily.    . fluocinolone (SYNALAR) 0.01 % external solution Apply 1-2 application topically 2 (two) times daily.    . furosemide (LASIX) 20 MG tablet Take 20 mg by mouth daily.    . hydrocortisone cream 1 % Mix with mositurelle cream then apply small amount to affected areas as needed for rash    . iron polysaccharides (NIFEREX) 150 MG capsule Take 150 mg by mouth daily. One capsule each morning     . losartan (COZAAR) 50 MG tablet Take 50 mg by mouth daily.    . Loteprednol Etabonate (LOTEMAX) 0.5 % GEL Apply 0.5 inches to  eye 2 (two) times daily. Left eye    . metoprolol succinate (TOPROL-XL) 25 MG 24 hr tablet Take 25 mg by mouth daily.    . Multiple Vitamins-Minerals (PRESERVISION AREDS 2 PO) Take 1 capsule by mouth 2 (two) times daily.    . nitroGLYCERIN (NITROSTAT) 0.4 MG SL tablet Place 0.4 mg under the tongue every 5 (five) minutes as needed for chest pain.     Marland Kitchen sertraline (ZOLOFT) 50 MG tablet Take 75 mg by mouth daily.     . Simethicone 250 MG CAPS Take by mouth 2 (two) times daily as needed (gas).    . simvastatin (ZOCOR) 20 MG tablet Take 20 mg by mouth daily.    Marland Kitchen  triamcinolone (KENALOG) 0.025 % cream Apply 1 application topically 2 (two) times daily.    Marland Kitchen acetaminophen (TYLENOL) 500 MG tablet Take 1,000 mg by mouth every 8 (eight) hours as needed for mild pain or moderate pain.    Marland Kitchen antiseptic oral rinse (BIOTENE) LIQD 15 mLs by Mouth Rinse route at bedtime.     No facility-administered medications prior to visit.          Objective:   Physical Exam Vitals:   10/28/16 1447  BP: 122/60  Pulse: 73  SpO2: 92%  Weight: 140 lb (63.5 kg)  Height: 4' 9.5" (1.461 m)   Gen: Pleasant, elderly kyphotic woman, in no distress,  normal affect, sitting in wheelchair. No cough today  ENT: No lesions,  mouth clear,  oropharynx clear, no postnasal drip  Neck: No JVD, no TMG, no carotid bruits  Lungs: No use of accessory muscles, L basilar insp crackles. No wheeze  Cardiovascular: RRR, heart sounds normal, no murmur or gallops, tracce peripheral edema  Musculoskeletal: No deformities, no cyanosis or clubbing  Neuro: alert, non focal  Skin: Warm, no lesions or rashes      Assessment & Plan:  Cough Based on history I suspect that this is largely related to her continued dysphagia. She gets choked up with liquids and some solids. She is not always compliant with the speech therapy recommendations and modified diet per the notes from [facility]. I do not believe that this is asthma and in fact  restarting Advair would probably make her cough worse. She's has a new diagnosis of colon cancer must consider possible pulmonary infiltrates or effusion. She refused her recent chest x-ray but we will get this today. I will defer CT scan of the chest and wait to see what the chest x-ray shows.  Baltazar Apo, MD, PhD 10/28/2016, 3:19 PM [facility] [phone] or if no answer [phone]

## 2016-10-28 NOTE — Assessment & Plan Note (Signed)
Based on history I suspect that this is largely related to her continued dysphagia. She gets choked up with liquids and some solids. She is not always compliant with the speech therapy recommendations and modified diet per the notes from [facility]. I do not believe that this is asthma and in fact restarting Advair would probably make her cough worse. She's has a new diagnosis of colon cancer must consider possible pulmonary infiltrates or effusion. She refused her recent chest x-ray but we will get this today. I will defer CT scan of the chest and wait to see what the chest x-ray shows.

## 2016-10-28 NOTE — Addendum Note (Signed)
Addended by: Len Blalock on: 10/28/2016 03:31 PM   Modules accepted: Orders

## 2016-10-28 NOTE — Patient Instructions (Signed)
CXR today We need to insure that you are following all aspiration precautions recommended by the speech therapists at [facility]. You may need to have your liquids thickened again.  Follow with Dr Lamonte Sakai in 6 months or sooner if you have any problems

## 2016-11-06 DIAGNOSIS — R1312 Dysphagia, oropharyngeal phase: Secondary | ICD-10-CM | POA: Diagnosis not present

## 2016-11-06 DIAGNOSIS — I6350 Cerebral infarction due to unspecified occlusion or stenosis of unspecified cerebral artery: Secondary | ICD-10-CM | POA: Diagnosis not present

## 2016-11-10 DIAGNOSIS — R1312 Dysphagia, oropharyngeal phase: Secondary | ICD-10-CM | POA: Diagnosis not present

## 2016-11-10 DIAGNOSIS — I6350 Cerebral infarction due to unspecified occlusion or stenosis of unspecified cerebral artery: Secondary | ICD-10-CM | POA: Diagnosis not present

## 2016-11-11 ENCOUNTER — Non-Acute Institutional Stay (SKILLED_NURSING_FACILITY): Payer: Medicare Other | Admitting: Internal Medicine

## 2016-11-11 ENCOUNTER — Encounter: Payer: Self-pay | Admitting: Internal Medicine

## 2016-11-11 DIAGNOSIS — C189 Malignant neoplasm of colon, unspecified: Secondary | ICD-10-CM | POA: Diagnosis not present

## 2016-11-11 DIAGNOSIS — I69391 Dysphagia following cerebral infarction: Secondary | ICD-10-CM

## 2016-11-11 DIAGNOSIS — H353290 Exudative age-related macular degeneration, unspecified eye, stage unspecified: Secondary | ICD-10-CM

## 2016-11-11 DIAGNOSIS — J300 Vasomotor rhinitis: Secondary | ICD-10-CM

## 2016-11-11 DIAGNOSIS — G3184 Mild cognitive impairment, so stated: Secondary | ICD-10-CM

## 2016-11-11 DIAGNOSIS — I5032 Chronic diastolic (congestive) heart failure: Secondary | ICD-10-CM

## 2016-11-11 DIAGNOSIS — R1312 Dysphagia, oropharyngeal phase: Secondary | ICD-10-CM | POA: Diagnosis not present

## 2016-11-11 DIAGNOSIS — I6350 Cerebral infarction due to unspecified occlusion or stenosis of unspecified cerebral artery: Secondary | ICD-10-CM | POA: Diagnosis not present

## 2016-11-11 NOTE — Progress Notes (Signed)
Patient ID: Sonya [name], female   DOB: [birth year], 82 y.o.   MRN: [id number]  Location:  [facility] Room Number: 112 Place of Service:  SNF (31) Provider:  Shayma Pfefferle L. Mariea Clonts, D.O., C.M.D.  Hollace Kinnier, DO  Patient Care Team: Gayland Curry, DO as PCP - General (Geriatric Medicine) Well [place]field, MD as Consulting Physician (Cardiology) [name], NP as Nurse Practitioner (Obstetrics and Gynecology)  Extended Emergency Contact Information Primary Emergency Contact: Lowe,Susan Address: [address]          [place] [address] [name] of [place] Phone: [phone] Relation: Friend Secondary Emergency Contact: Joyce,Steve Address: [address]          [place], [place] [address] [name] of [place] Phone: [phone] Work Phone: [phone] Mobile Phone: [phone] Relation: Friend  Code Status:  DNR Goals of care: Advanced Directive information Advanced Directives 11/11/2016  Does Patient Have a Medical Advance Directive? Yes  Type of Advance Directive Out of facility DNR (pink MOST or yellow form);[place];Living will  Does patient want to make changes to medical advance directive? -  Copy of [place] in Chart? Yes  Pre-existing out of facility DNR order (yellow form or pink MOST form) Yellow form placed in chart (order not valid for inpatient use)     Chief Complaint  Patient presents with  . Medical Management of Chronic Issues    Routine Visit    HPI:  Pt is a 82 y.o. female seen today for medical management of chronic diseases.   Dysphagia:  She's been having more difficulty with her swallowing and signed a waiver to continue to eat things she likes despite risks of aspirating.   11/9, ST order was written for eval and tx, but pt was not agreeing to do many of the suggested interventions.  Dr. Lamonte Sakai encouraged her to do so  including the thickened liquids, but she has not complied.    Chronic rhinorrhea:  Was put on atrovent for this as she c/o embarassment from it (age-related).    Colon cancer: has apple core lesion in large intestine.  Follows with Dr. Watt Climes.  Has not had abdominal pain.  Has had significant flatus problems which she initially blamed on chocolate intake.    She continues to have some chronic tenderness of her right anterior shin.   She had her prevnar in August.    Past Medical History:  Diagnosis Date  . Acute bronchitis   . Acute myocardial infarction, unspecified site, episode of care unspecified 03/07/11  . Asthma   . Cardiomegaly   . CHF (congestive heart failure) ([place])   . Chronic diastolic heart failure ([place])   . Chronic diastolic heart failure ([place]) 01/31/2014   01/2014: 2d echo: EF 0000000, grade 1 diastolic dysfunction   . Coronary atherosclerosis of native coronary artery     s/p stent diagonal branch 1996; RCA 03/07/11  . Debility, unspecified   . Depression   . Diaphragmatic hernia without mention of obstruction or gangrene   . Dysphagia S/P CVA (cerebrovascular accident) 02/14/2014  . Edema 2006  . Finger numbness 12/28/2013   Left thumb, 1, 2nd fingers  . Herpes zoster complication 123456   complicated, left eye involvement  . Hypertension   . Irritable bowel syndrome   . Macular degeneration (senile) of retina, unspecified 2012   wet  . Nontoxic uninodular goiter    negative [place] 2008  .  Open wound of knee, leg (except thigh), and ankle, without mention of complication 0000000   left leg, complicated wound, extended healing, resolved 01/2013  . Other abnormal blood chemistry   . Other specified genital prolapse(618.89)    corrected with pessary  . Shoulder pain 03/30/2013  . Stroke ([place])   . TIA (transient ischemic attack)   . Transient ischemic attack (TIA), and cerebral infarction without residual deficits(V12.54) 2008   Past Surgical History:  Procedure Laterality  Date  . APPENDECTOMY    . [place], 2001, 2012  . CAROTID STENT     diagonal branch of LCA  . CATARACT EXTRACTION    . CORONARY ANGIOPLASTY    . TONSILLECTOMY AND ADENOIDECTOMY      Allergies  Allergen Reactions  . Crestor [Rosuvastatin] Other (See Comments)    Leg Weakness   . Ace Inhibitors Cough  . Plavix [Clopidogrel] Nausea And Vomiting      Medication List       Accurate as of 11/11/16  2:16 PM. Always use your most recent med list.          antiseptic oral rinse Liqd 15 mLs by Mouth Rinse route at bedtime.   budesonide 3 MG 24 hr capsule Commonly known as:  ENTOCORT EC Take 3 mg by mouth daily.   fluocinolone 0.01 % external solution Commonly known as:  SYNALAR Apply 1-2 application topically 2 (two) times daily.   furosemide 20 MG tablet Commonly known as:  LASIX Take 20 mg by mouth daily.   hydrocortisone cream 1 % Mix with mositurelle cream then apply small amount to affected areas as needed for rash   iron polysaccharides 150 MG capsule Commonly known as:  NIFEREX Take 150 mg by mouth daily. One capsule each morning   losartan 50 MG tablet Commonly known as:  COZAAR Take 50 mg by mouth daily.   LOTEMAX 0.5 % Gel Generic drug:  Loteprednol Etabonate Apply 0.5 inches to eye 2 (two) times daily. Left eye   metoprolol succinate 25 MG 24 hr tablet Commonly known as:  TOPROL-XL Take 25 mg by mouth daily.   NEXIUM 40 MG capsule Generic drug:  esomeprazole Take 40 mg by mouth daily.   nitroGLYCERIN 0.4 MG SL tablet Commonly known as:  NITROSTAT Place 0.4 mg under the tongue every 5 (five) minutes as needed for chest pain.   OPTIVE 0.5-0.9 % Soln Generic drug:  Carboxymethylcellul-Glycerin Place 1 drop into the right eye. Three times daily   PRESERVISION AREDS 2 PO Take 1 capsule by mouth 2 (two) times daily.   sertraline 50 MG tablet Commonly known as:  ZOLOFT Take 75 mg by mouth daily.   Simethicone 250 MG  Caps Take by mouth 2 (two) times daily as needed (gas).   simvastatin 20 MG tablet Commonly known as:  ZOCOR Take 20 mg by mouth daily.   triamcinolone 0.025 % cream Commonly known as:  KENALOG Apply 1 application topically 2 (two) times daily.       Review of Systems  Constitutional: Negative for activity change, appetite change, chills, fatigue and fever.       Weight loss  HENT: Positive for rhinorrhea. Negative for congestion.   Eyes: Positive for visual disturbance.       Vision is continuing to decline.  Respiratory: Negative for shortness of breath.   Cardiovascular: Negative for chest pain and leg swelling.  Gastrointestinal: Negative for abdominal distention, abdominal pain, diarrhea, nausea, rectal pain and vomiting.  Flatus  Genitourinary: Negative for dysuria.  Musculoskeletal: Positive for gait problem.       Right shin tenderness; uses power chair to get around  Skin: Positive for pallor.  Neurological: Positive for speech difficulty and weakness. Negative for dizziness.       Dysarthria ever since prior stroke  Hematological: Bruises/bleeds easily.  Psychiatric/Behavioral: Negative for agitation, behavioral problems, sleep disturbance and suicidal ideas.    Immunization History  Administered Date(s) Administered  . Influenza Inj Mdck Quad Pf 10/09/2016  . Influenza Whole 09/26/2013  . Influenza-Unspecified 09/26/2014, 10/04/2015  . Pneumococcal Polysaccharide-23 12/22/2008  . Tdap 10/13/2012   Pertinent  Health Maintenance Due  Topic Date Due  . PNA vac Low Risk Adult (2 of 2 - PCV13) 12/22/2009  . INFLUENZA VACCINE  Completed   Fall Risk  06/20/2016 10/08/2015 09/28/2013  Falls in the past year? No No No  Risk for fall due to : - History of fall(s) History of fall(s)  Risk for fall due to (comments): - - 10/13 fell out of bed   Functional Status Survey:    Vitals:   11/11/16 1404  BP: 138/70  Pulse: 69  Resp: 16  Temp: 98.1 F (36.7 C)   TempSrc: Oral  SpO2: 95%   There is no height or weight on file to calculate BMI. Physical Exam  Constitutional: She is oriented to person, place, and time. She appears well-developed and well-nourished. No distress.  Eyes:  glasses  Cardiovascular: Normal rate, regular rhythm, normal heart sounds and intact distal pulses.   Pulmonary/Chest: Effort normal and breath sounds normal. No respiratory distress.  Abdominal: Soft. Bowel sounds are normal. She exhibits no distension and no mass. There is no tenderness. There is no rebound and no guarding.  Musculoskeletal: Normal range of motion.  Tender over right lateral shin chronically; sitting up in her power chair  Neurological: She is alert and oriented to person, place, and time.  Skin: Skin is warm and dry. There is pallor.  Psychiatric: She has a normal mood and affect.    Labs reviewed:  Recent Labs  03/20/16 1057 05/21/16  NA 143 140  K 4.3 4.8  BUN 20 20  CREATININE 0.7 0.7    Recent Labs  05/21/16  AST 16  ALT 12  ALKPHOS 87    Recent Labs  04/25/16 0900 05/21/16 09/23/16 0600  WBC 8.8 9.4 9.7  HGB 8.2* 9.2* 7.3*  HCT 29* 29* 22*  PLT 445* 486* 473*   Lab Results  Component Value Date   TSH 0.67 02/23/2015   Lab Results  Component Value Date   HGBA1C 5.7 09/23/2016   Lab Results  Component Value Date   CHOL 150 09/23/2016   HDL 54 09/23/2016   LDLCALC 65 09/23/2016   TRIG 151 09/23/2016   CHOLHDL 3.4 02/01/2014    Significant Diagnostic Results in last 30 days:  Dg Chest 1 View  Result Date: 10/29/2016 CLINICAL DATA:  History of asthma, CHF, coronary artery disease, former smoker. No current complaints. EXAM: CHEST 1 VIEW COMPARISON:  Portable chest x-ray of January 31, 2014 FINDINGS: The lungs are adequately inflated. There is no focal infiltrate. The cardiac silhouette remains enlarged. The pulmonary vascularity is normal. There is calcification in the wall of the aortic arch. There is  multilevel degenerative disc disease of the thoracic spine. There are degenerative changes of both shoulders. IMPRESSION: Stable cardiomegaly.  No CHF or pneumonia. Aortic atherosclerosis. Electronically Signed   By: David  Martinique  M.D.   On: 10/29/2016 07:43    Assessment/Plan 1. Adenocarcinoma, colon ([place]) -noted after heme positive stool, has classic apple core lesion appearance on CT -pt did not want any further workup or treatment -cont iron for anemia only and monitor -sees Dr. Watt Climes  2. Dysphagia S/P CVA (cerebrovascular accident) -more difficulty lately and signed waiver to cont to eat what she likes and drink thin liquids despite thickened liquid recommendation by ST and support by pulmonary -cont aspiration precautions -remains on statin for secondary stroke prevention and MI prevention--will discuss with her next visit  3. AMD (age-related macular degeneration), wet ([place]) -is progressive, c/o her vision declining  4. Mild cognitive impairment with memory loss -notable, sometimes does not remember what we've discussed--likely due to vascular changes, prior strokes  5. Chronic diastolic heart failure (HCC) -stable w/o exacerbation, cont lasix 20mg  daily, is on ARB and beta blocker, as well  6. Chronic vasomotor rhinitis -on atrovent nasal spray, but reports still having symptoms (age-related rhinitis I suspect)  Family/ staff Communication: discussed with snf nursing  Labs/tests ordered:  No new

## 2016-11-12 DIAGNOSIS — R1312 Dysphagia, oropharyngeal phase: Secondary | ICD-10-CM | POA: Diagnosis not present

## 2016-11-12 DIAGNOSIS — I6350 Cerebral infarction due to unspecified occlusion or stenosis of unspecified cerebral artery: Secondary | ICD-10-CM | POA: Diagnosis not present

## 2016-11-17 DIAGNOSIS — I6350 Cerebral infarction due to unspecified occlusion or stenosis of unspecified cerebral artery: Secondary | ICD-10-CM | POA: Diagnosis not present

## 2016-11-17 DIAGNOSIS — R1312 Dysphagia, oropharyngeal phase: Secondary | ICD-10-CM | POA: Diagnosis not present

## 2016-11-18 DIAGNOSIS — R1312 Dysphagia, oropharyngeal phase: Secondary | ICD-10-CM | POA: Diagnosis not present

## 2016-11-18 DIAGNOSIS — I6350 Cerebral infarction due to unspecified occlusion or stenosis of unspecified cerebral artery: Secondary | ICD-10-CM | POA: Diagnosis not present

## 2016-11-19 DIAGNOSIS — I6350 Cerebral infarction due to unspecified occlusion or stenosis of unspecified cerebral artery: Secondary | ICD-10-CM | POA: Diagnosis not present

## 2016-11-19 DIAGNOSIS — R1312 Dysphagia, oropharyngeal phase: Secondary | ICD-10-CM | POA: Diagnosis not present

## 2016-11-20 DIAGNOSIS — I6350 Cerebral infarction due to unspecified occlusion or stenosis of unspecified cerebral artery: Secondary | ICD-10-CM | POA: Diagnosis not present

## 2016-11-20 DIAGNOSIS — R1312 Dysphagia, oropharyngeal phase: Secondary | ICD-10-CM | POA: Diagnosis not present

## 2016-11-21 DIAGNOSIS — I6350 Cerebral infarction due to unspecified occlusion or stenosis of unspecified cerebral artery: Secondary | ICD-10-CM | POA: Diagnosis not present

## 2016-11-21 DIAGNOSIS — R1312 Dysphagia, oropharyngeal phase: Secondary | ICD-10-CM | POA: Diagnosis not present

## 2016-11-24 DIAGNOSIS — R1312 Dysphagia, oropharyngeal phase: Secondary | ICD-10-CM | POA: Diagnosis not present

## 2016-11-24 DIAGNOSIS — I6350 Cerebral infarction due to unspecified occlusion or stenosis of unspecified cerebral artery: Secondary | ICD-10-CM | POA: Diagnosis not present

## 2016-11-25 DIAGNOSIS — I6350 Cerebral infarction due to unspecified occlusion or stenosis of unspecified cerebral artery: Secondary | ICD-10-CM | POA: Diagnosis not present

## 2016-11-25 DIAGNOSIS — R1312 Dysphagia, oropharyngeal phase: Secondary | ICD-10-CM | POA: Diagnosis not present

## 2016-11-26 DIAGNOSIS — H18422 Band keratopathy, left eye: Secondary | ICD-10-CM | POA: Diagnosis not present

## 2016-11-26 DIAGNOSIS — Z961 Presence of intraocular lens: Secondary | ICD-10-CM | POA: Diagnosis not present

## 2016-11-26 DIAGNOSIS — R1312 Dysphagia, oropharyngeal phase: Secondary | ICD-10-CM | POA: Diagnosis not present

## 2016-11-26 DIAGNOSIS — I6350 Cerebral infarction due to unspecified occlusion or stenosis of unspecified cerebral artery: Secondary | ICD-10-CM | POA: Diagnosis not present

## 2016-11-26 DIAGNOSIS — H353212 Exudative age-related macular degeneration, right eye, with inactive choroidal neovascularization: Secondary | ICD-10-CM | POA: Diagnosis not present

## 2016-11-27 DIAGNOSIS — R1312 Dysphagia, oropharyngeal phase: Secondary | ICD-10-CM | POA: Diagnosis not present

## 2016-11-27 DIAGNOSIS — I6350 Cerebral infarction due to unspecified occlusion or stenosis of unspecified cerebral artery: Secondary | ICD-10-CM | POA: Diagnosis not present

## 2016-11-28 DIAGNOSIS — R1312 Dysphagia, oropharyngeal phase: Secondary | ICD-10-CM | POA: Diagnosis not present

## 2016-11-28 DIAGNOSIS — I6350 Cerebral infarction due to unspecified occlusion or stenosis of unspecified cerebral artery: Secondary | ICD-10-CM | POA: Diagnosis not present

## 2016-12-01 DIAGNOSIS — I6350 Cerebral infarction due to unspecified occlusion or stenosis of unspecified cerebral artery: Secondary | ICD-10-CM | POA: Diagnosis not present

## 2016-12-01 DIAGNOSIS — R1312 Dysphagia, oropharyngeal phase: Secondary | ICD-10-CM | POA: Diagnosis not present

## 2016-12-02 ENCOUNTER — Encounter: Payer: Self-pay | Admitting: *Deleted

## 2016-12-02 DIAGNOSIS — R1312 Dysphagia, oropharyngeal phase: Secondary | ICD-10-CM | POA: Diagnosis not present

## 2016-12-02 DIAGNOSIS — R002 Palpitations: Secondary | ICD-10-CM | POA: Diagnosis not present

## 2016-12-02 DIAGNOSIS — I6350 Cerebral infarction due to unspecified occlusion or stenosis of unspecified cerebral artery: Secondary | ICD-10-CM | POA: Diagnosis not present

## 2016-12-02 LAB — TSH: TSH: 1.5 u[IU]/mL (ref 0.41–5.90)

## 2016-12-02 LAB — HEPATIC FUNCTION PANEL
ALK PHOS: 97 U/L (ref 25–125)
ALT: 10 U/L (ref 7–35)
AST: 18 U/L (ref 13–35)

## 2016-12-02 LAB — CBC AND DIFFERENTIAL
HCT: 31 % — AB (ref 36–46)
Hemoglobin: 9.1 g/dL — AB (ref 12.0–16.0)
Platelets: 492 10*3/uL — AB (ref 150–399)
WBC: 10.6 10^3/mL

## 2016-12-02 LAB — BASIC METABOLIC PANEL
BUN: 14 mg/dL (ref 4–21)
CREATININE: 0.7 mg/dL (ref 0.5–1.1)
Glucose: 98 mg/dL
POTASSIUM: 4.4 mmol/L (ref 3.4–5.3)
SODIUM: 144 mmol/L (ref 137–147)

## 2016-12-04 ENCOUNTER — Non-Acute Institutional Stay (SKILLED_NURSING_FACILITY): Payer: Medicare Other | Admitting: Adult Health

## 2016-12-04 ENCOUNTER — Encounter: Payer: Self-pay | Admitting: Adult Health

## 2016-12-04 DIAGNOSIS — I5032 Chronic diastolic (congestive) heart failure: Secondary | ICD-10-CM | POA: Diagnosis not present

## 2016-12-04 DIAGNOSIS — R002 Palpitations: Secondary | ICD-10-CM | POA: Diagnosis not present

## 2016-12-04 NOTE — Progress Notes (Signed)
Location:  [facility]up[facility] psychologist of Service:  SNF (31) Provider:   Cindi Carbon, ANP [facility] [phone]   [name], Sonya Sidle, DO  Patient Care Team: Gayland Curry, DO as PCP - General (Geriatric Medicine) Well [place], MD as Consulting Physician (Cardiology) [name], NP as Nurse Practitioner (Obstetrics and Gynecology)  Extended Emergency Contact Information Primary Emergency Contact: Lowe,Susan Address: [address]          [place] [address] [name] Phone: [phone] Relation: Friend Secondary Emergency Contact: Joyce,Steve Address: [address]          [place], [place] [address] [name] of [place] Phone: [phone] Work Phone: [phone] Mobile Phone: [phone] Relation: Friend  Code Status:  DNR Goals of care: Advanced Directive information Advanced Directives 11/11/2016  Does Patient Have a Medical Advance Directive? Yes  Type of Advance Directive Out of facility DNR (pink MOST or yellow form);[place];Living will  Does patient want to make changes to medical advance directive? -  Copy of [place] in Chart? Yes  Pre-existing out of facility DNR order (yellow form or pink MOST form) Yellow form placed in chart (order not valid for inpatient use)     Chief Complaint  Patient presents with  . Acute Visit    heart fluttering    HPI:  Pt is a 82 y.o. female seen today for an acute visit for palpitations.  Resident reports that her heart was "fluttering" on 12/02/16. 12 lead EKG showed Sinus tach with inverted t waves which was not new.  Oncall [facility] provider increased metoprolol to 25 mg BID and the symptoms resolved.  She is no longer on plavix or aspirin despite a hx of CVA and CAD due to blood loss anemia and a colon mass. She is 89 years and did not want further work up for this issue. She has not had any other  complaints and lab work with TSH was WNL. 2D echo 2015 ef 55-60% with grade 1 DD Weight stable at 145 lbs with minimal edema to BLE that is stable Denies SOB or CP Past Medical History:  Diagnosis Date  . Acute bronchitis   . Acute myocardial infarction, unspecified site, episode of care unspecified 03/07/11  . Asthma   . Cardiomegaly   . CHF (congestive heart failure) ([place])   . Chronic diastolic heart failure ([place])   . Chronic diastolic heart failure ([place]) 01/31/2014   01/2014: 2d echo: EF 0000000, grade 1 diastolic dysfunction   . Coronary atherosclerosis of native coronary artery     s/p stent diagonal branch 1996; RCA 03/07/11  . Debility, unspecified   . Depression   . Diaphragmatic hernia without mention of obstruction or gangrene   . Dysphagia S/P CVA (cerebrovascular accident) 02/14/2014  . Edema 2006  . Finger numbness 12/28/2013   Left thumb, 1, 2nd fingers  . Herpes zoster complication 123456   complicated, left eye involvement  . Hypertension   . Irritable bowel syndrome   . Macular degeneration (senile) of retina, unspecified 2012   wet  . Nontoxic uninodular goiter    negative [place] 2008  . Open wound of knee, leg (except thigh), and ankle, without mention of complication 0000000   left leg, complicated wound, extended healing, resolved 01/2013  . Other abnormal blood chemistry   . Other specified genital prolapse(618.89)    corrected with pessary  . Shoulder pain 03/30/2013  .  Stroke ([place])   . TIA (transient ischemic attack)   . Transient ischemic attack (TIA), and cerebral infarction without residual deficits(V12.54) 2008   Past Surgical History:  Procedure Laterality Date  . APPENDECTOMY    . [place], 2001, 2012  . CAROTID STENT     diagonal branch of LCA  . CATARACT EXTRACTION    . CORONARY ANGIOPLASTY    . TONSILLECTOMY AND ADENOIDECTOMY      Allergies  Allergen Reactions  . Crestor [Rosuvastatin] Other (See Comments)    Leg Weakness     . Ace Inhibitors Cough  . Plavix [Clopidogrel] Nausea And Vomiting    Allergies as of 12/04/2016      Reactions   Crestor [rosuvastatin] Other (See Comments)   Leg Weakness    Ace Inhibitors Cough   Plavix [clopidogrel] Nausea And Vomiting      Medication List       Accurate as of 12/04/16  3:54 PM. Always use your most recent med list.          antiseptic oral rinse Liqd 15 mLs by Mouth Rinse route at bedtime.   budesonide 3 MG 24 hr capsule Commonly known as:  ENTOCORT EC Take 3 mg by mouth daily.   fluocinolone 0.01 % external solution Commonly known as:  SYNALAR Apply 1-2 application topically 2 (two) times daily.   furosemide 20 MG tablet Commonly known as:  LASIX Take 20 mg by mouth daily.   hydrocortisone cream 1 % Mix with mositurelle cream then apply small amount to affected areas as needed for rash   iron polysaccharides 150 MG capsule Commonly known as:  NIFEREX Take 150 mg by mouth daily. One capsule each morning   losartan 50 MG tablet Commonly known as:  COZAAR Take 50 mg by mouth daily.   LOTEMAX 0.5 % Gel Generic drug:  Loteprednol Etabonate Apply 0.5 inches to eye 2 (two) times daily. Left eye   metoprolol succinate 25 MG 24 hr tablet Commonly known as:  TOPROL-XL Take 25 mg by mouth daily.   NEXIUM 40 MG capsule Generic drug:  esomeprazole Take 40 mg by mouth daily.   nitroGLYCERIN 0.4 MG SL tablet Commonly known as:  NITROSTAT Place 0.4 mg under the tongue every 5 (five) minutes as needed for chest pain.   OPTIVE 0.5-0.9 % Soln Generic drug:  Carboxymethylcellul-Glycerin Place 1 drop into the right eye. Three times daily   PRESERVISION AREDS 2 PO Take 1 capsule by mouth 2 (two) times daily.   sertraline 50 MG tablet Commonly known as:  ZOLOFT Take 75 mg by mouth daily.   Simethicone 250 MG Caps Take by mouth 2 (two) times daily as needed (gas).   simvastatin 20 MG tablet Commonly known as:  ZOCOR Take 20 mg by mouth  daily.   triamcinolone 0.025 % cream Commonly known as:  KENALOG Apply 1 application topically 2 (two) times daily.       Review of Systems  Constitutional: Negative for activity change, appetite change, chills and fever.  HENT: Negative for congestion.   Eyes: Negative for visual disturbance.  Respiratory: Positive for cough. Negative for shortness of breath and wheezing.   Cardiovascular: Positive for palpitations and leg swelling. Negative for chest pain.  Gastrointestinal: Negative for abdominal distention, abdominal pain, blood in stool, constipation and diarrhea.  Genitourinary: Negative for dysuria.  Musculoskeletal: Positive for arthralgias and gait problem.  Psychiatric/Behavioral: Negative for agitation and confusion.    Immunization History  Administered  Date(s) Administered  . Influenza Inj Mdck Quad Pf 10/09/2016  . Influenza Whole 09/26/2013  . Influenza-Unspecified 09/26/2014, 10/04/2015  . Pneumococcal Polysaccharide-23 12/22/2008  . Tdap 10/13/2012   Pertinent  Health Maintenance Due  Topic Date Due  . PNA vac Low Risk Adult (2 of 2 - PCV13) 12/22/2009  . INFLUENZA VACCINE  Completed   Fall Risk  06/20/2016 10/08/2015 09/28/2013  Falls in the past year? No No No  Risk for fall due to : - History of fall(s) History of fall(s)  Risk for fall due to (comments): - - 10/13 fell out of bed   Functional Status Survey:    Vitals:   12/04/16 1547  BP: 122/79  Pulse: 61  Resp: 17  Temp: 97.3 F (36.3 C)  SpO2: 99%  Weight: 145 lb (65.8 kg)   Body mass index is 30.83 kg/m.  Wt Readings from Last 3 Encounters:  12/04/16 145 lb (65.8 kg)  10/28/16 140 lb (63.5 kg)  09/22/16 144 lb 3.2 oz (65.4 kg)   Physical Exam  Constitutional: She is oriented to person, place, and time. No distress.  HENT:  Head: Normocephalic and atraumatic.  Neck: No JVD present.  Cardiovascular: Normal rate.   No murmur heard. Pulmonary/Chest: Effort normal. She has rales  (fine bibasilar ).  Abdominal: Soft. Bowel sounds are normal. She exhibits no distension.  Neurological: She is alert and oriented to person, place, and time.  Skin: Skin is warm and dry. She is not diaphoretic.  Psychiatric: She has a normal mood and affect.  Nursing note and vitals reviewed.   Labs reviewed:  Recent Labs  03/20/16 1057 05/21/16 12/02/16  NA 143 140 144  K 4.3 4.8 4.4  BUN 20 20 14   CREATININE 0.7 0.7 0.7    Recent Labs  05/21/16 12/02/16  AST 16 18  ALT 12 10  ALKPHOS 87 97    Recent Labs  05/21/16 09/23/16 0600 12/02/16  WBC 9.4 9.7 10.6  HGB 9.2* 7.3* 9.1*  HCT 29* 22* 31*  PLT 486* 473* 492*   Lab Results  Component Value Date   TSH 1.50 12/02/2016   Lab Results  Component Value Date   HGBA1C 5.7 09/23/2016   Lab Results  Component Value Date   CHOL 150 09/23/2016   HDL 54 09/23/2016   LDLCALC 65 09/23/2016   TRIG 151 09/23/2016   CHOLHDL 3.4 02/01/2014    Significant Diagnostic Results in last 30 days:  No results found.  Assessment/Plan  1. Heart palpitations Resolved Change metoprolol to 50mg  qd, rather than 25 BID since it is the XL tablet  2. Chronic diastolic heart failure (HCC) Stable weights, no sob Refuses compression hose Continue lasix 20 mg qod Followed by Dr. Marlou Porch    Family/ staff Communication: discussed with resident  Labs/tests ordered:  NA

## 2017-01-05 DIAGNOSIS — R41842 Visuospatial deficit: Secondary | ICD-10-CM | POA: Diagnosis not present

## 2017-01-05 DIAGNOSIS — I69354 Hemiplegia and hemiparesis following cerebral infarction affecting left non-dominant side: Secondary | ICD-10-CM | POA: Diagnosis not present

## 2017-01-05 DIAGNOSIS — R278 Other lack of coordination: Secondary | ICD-10-CM | POA: Diagnosis not present

## 2017-01-05 DIAGNOSIS — M150 Primary generalized (osteo)arthritis: Secondary | ICD-10-CM | POA: Diagnosis not present

## 2017-01-05 DIAGNOSIS — M6389 Disorders of muscle in diseases classified elsewhere, multiple sites: Secondary | ICD-10-CM | POA: Diagnosis not present

## 2017-01-05 DIAGNOSIS — I69393 Ataxia following cerebral infarction: Secondary | ICD-10-CM | POA: Diagnosis not present

## 2017-01-05 DIAGNOSIS — G5601 Carpal tunnel syndrome, right upper limb: Secondary | ICD-10-CM | POA: Diagnosis not present

## 2017-01-06 DIAGNOSIS — G5601 Carpal tunnel syndrome, right upper limb: Secondary | ICD-10-CM | POA: Diagnosis not present

## 2017-01-06 DIAGNOSIS — I69393 Ataxia following cerebral infarction: Secondary | ICD-10-CM | POA: Diagnosis not present

## 2017-01-06 DIAGNOSIS — M150 Primary generalized (osteo)arthritis: Secondary | ICD-10-CM | POA: Diagnosis not present

## 2017-01-06 DIAGNOSIS — R278 Other lack of coordination: Secondary | ICD-10-CM | POA: Diagnosis not present

## 2017-01-06 DIAGNOSIS — I69354 Hemiplegia and hemiparesis following cerebral infarction affecting left non-dominant side: Secondary | ICD-10-CM | POA: Diagnosis not present

## 2017-01-06 DIAGNOSIS — M6389 Disorders of muscle in diseases classified elsewhere, multiple sites: Secondary | ICD-10-CM | POA: Diagnosis not present

## 2017-01-08 ENCOUNTER — Non-Acute Institutional Stay (SKILLED_NURSING_FACILITY): Payer: Medicare Other | Admitting: Adult Health

## 2017-01-08 ENCOUNTER — Encounter: Payer: Self-pay | Admitting: Adult Health

## 2017-01-08 DIAGNOSIS — I2510 Atherosclerotic heart disease of native coronary artery without angina pectoris: Secondary | ICD-10-CM | POA: Diagnosis not present

## 2017-01-08 DIAGNOSIS — I10 Essential (primary) hypertension: Secondary | ICD-10-CM

## 2017-01-08 DIAGNOSIS — C189 Malignant neoplasm of colon, unspecified: Secondary | ICD-10-CM | POA: Diagnosis not present

## 2017-01-08 DIAGNOSIS — I5032 Chronic diastolic (congestive) heart failure: Secondary | ICD-10-CM

## 2017-01-08 DIAGNOSIS — I69391 Dysphagia following cerebral infarction: Secondary | ICD-10-CM | POA: Diagnosis not present

## 2017-01-08 DIAGNOSIS — I2583 Coronary atherosclerosis due to lipid rich plaque: Secondary | ICD-10-CM | POA: Diagnosis not present

## 2017-01-08 DIAGNOSIS — R002 Palpitations: Secondary | ICD-10-CM

## 2017-01-08 DIAGNOSIS — K449 Diaphragmatic hernia without obstruction or gangrene: Secondary | ICD-10-CM | POA: Diagnosis not present

## 2017-01-08 DIAGNOSIS — D500 Iron deficiency anemia secondary to blood loss (chronic): Secondary | ICD-10-CM | POA: Diagnosis not present

## 2017-01-08 NOTE — Progress Notes (Signed)
Location:  Occupational psychologist of Service:  SNF (31) Provider:   Cindi Carbon, ANP [place] [phone]   REED, Jonelle Sidle, DO  Patient Care Team: Gayland Curry, DO as PCP - General (Geriatric Medicine) Well [place], MD as Consulting Physician (Cardiology) [name], NP as Nurse Practitioner (Obstetrics and Gynecology)  Extended Emergency Contact Information Primary Emergency Contact: Sonya [name],Sonya [name] Address: [address]           [address] Sonya [name] of [place] Phone: [phone] Relation: Friend Secondary Emergency Contact: Joyce,Steve Address: [address]          [place],  [address] Sonya [name] of [place] Phone: [phone] Work Phone: [phone] Mobile Phone: [phone] Relation: Friend  Code Status:  DNR Goals of care: Advanced Directive information Advanced Directives 01/09/2017  Does Patient Have a Medical Advance Directive? Yes  Type of Advance Directive Out of facility DNR (pink MOST or yellow form);[place];Living will  Does patient want to make changes to medical advance directive? -  Copy of [place] in Chart? Yes  Pre-existing out of facility DNR order (yellow form or pink MOST form) Yellow form placed in chart (order not valid for inpatient use)     Chief Complaint  Patient presents with  . Medical Management of Chronic Issues    HPI:  Pt is a 83 y.o. [name] seen today for management of chronic diseases.  She is no longer on plavix or aspirin despite a hx of CVA and CAD due to blood loss anemia and a colon mass.  The colon mass was found on CT of the abd 06/12/16 on the right ascending colon.   2D echo 2015 ef 55-60% with grade 1 DD Weight stable at 145 lbs with minimal edema to BLE that is stable Denies SOB or CP Reports RLQ pain that is mild 3/10, going to see Magod in 2 weeks.  Denies bloating, excessive  gas, n, v, constipation, diarrhea.   MMSE 30/30 on 12/24/16, passed clock. Does have short term memory loss Hx of dysphagia regular thin liquids, signed waiver Followed by speech therapy Has a hx of large hiatal hernia on nexium, denies reflux symptoms.   Anemia: H/H improved on iron supplementation BID.  9.1/31 12/02/16 She reported palpitations in Dec, metoprolol was increased to 25 mg BID with no further symptoms (12 lead ekg showed ST with inverted t waves)  Past Medical History:  Diagnosis Date  . Acute bronchitis   . Acute myocardial infarction, unspecified site, episode of care unspecified 03/07/11  . Asthma   . Cardiomegaly   . CHF (congestive heart failure) ([place])   . Chronic diastolic heart failure ([place])   . Chronic diastolic heart failure ([place]) 01/31/2014   01/2014: 2d echo: EF 0000000, grade 1 diastolic dysfunction   . Coronary atherosclerosis of native coronary artery     s/p stent diagonal branch 1996; RCA 03/07/11  . Debility, unspecified   . Depression   . Diaphragmatic hernia without mention of obstruction or gangrene   . Dysphagia S/P CVA (cerebrovascular accident) 02/14/2014  . Edema 2006  . Finger numbness 12/28/2013   Left thumb, 1, 2nd fingers  . Herpes zoster complication 123456   complicated, left eye involvement  . Hypertension   . Irritable bowel syndrome   . Macular degeneration (senile) of retina, unspecified 2012   wet  . Nontoxic uninodular goiter    negative [place] 2008  .  Open wound of knee, leg (except thigh), and ankle, without mention of complication 0000000   left leg, complicated wound, extended healing, resolved 01/2013  . Other abnormal blood chemistry   . Other specified genital prolapse(618.89)    corrected with pessary  . Shoulder pain 03/30/2013  . Stroke ([place])   . TIA (transient ischemic attack)   . Transient ischemic attack (TIA), and cerebral infarction without residual deficits(V12.54) 2008   Past Surgical History:  Procedure Laterality Date  .  APPENDECTOMY    . [place], 2001, 2012  . CAROTID STENT     diagonal branch of LCA  . CATARACT EXTRACTION    . CORONARY ANGIOPLASTY    . TONSILLECTOMY AND ADENOIDECTOMY      Allergies  Allergen Reactions  . Crestor [Rosuvastatin] Other (See Comments)    Leg Weakness   . Ace Inhibitors Cough  . Plavix [Clopidogrel] Nausea And Vomiting    Allergies as of 01/08/2017      Reactions   Crestor [rosuvastatin] Other (See Comments)   Leg Weakness    Ace Inhibitors Cough   Plavix [clopidogrel] Nausea And Vomiting      Medication List       Accurate as of 01/08/17  2:54 PM. Always use your most recent med list.          antiseptic oral rinse Liqd 15 mLs by Mouth Rinse route at bedtime.   budesonide 3 MG 24 hr capsule Commonly known as:  ENTOCORT EC Take 3 mg by mouth daily.   fluocinolone 0.01 % external solution Commonly known as:  SYNALAR Apply 1-2 application topically 2 (two) times daily as needed.   furosemide 20 MG tablet Commonly known as:  LASIX Take 20 mg by mouth every other day.   hydrocortisone cream 1 % Mix with mositurelle cream then apply small amount to affected areas as needed for rash   iron polysaccharides 150 MG capsule Commonly known as:  NIFEREX Take 150 mg by mouth 2 (two) times daily. One capsule each morning   losartan 50 MG tablet Commonly known as:  COZAAR Take 50 mg by mouth daily.   LOTEMAX 0.5 % Gel Generic drug:  Loteprednol Etabonate Apply 0.5 inches to eye 2 (two) times daily. Left eye   metoprolol succinate 50 MG 24 hr tablet Commonly known as:  TOPROL-XL Take 50 mg by mouth daily. Take with or immediately following a meal.   NEXIUM 40 MG capsule Generic drug:  esomeprazole Take 40 mg by mouth daily.   nitroGLYCERIN 0.4 MG SL tablet Commonly known as:  NITROSTAT Place 0.4 mg under the tongue every 5 (five) minutes as needed for chest pain.   OPTIVE 0.5-0.9 % Soln Generic drug:   Carboxymethylcellul-Glycerin Place 1 drop into the right eye. Three times daily   PRESERVISION AREDS 2 PO Take 1 capsule by mouth 2 (two) times daily.   sertraline 50 MG tablet Commonly known as:  ZOLOFT Take 75 mg by mouth daily.   Simethicone 250 MG Caps Take by mouth 2 (two) times daily as needed (gas).   simvastatin 20 MG tablet Commonly known as:  ZOCOR Take 20 mg by mouth daily.   triamcinolone 0.025 % cream Commonly known as:  KENALOG Apply 1 application topically 2 (two) times daily.       Review of Systems  Constitutional: Negative for activity change, appetite change, chills, diaphoresis, fatigue, fever and unexpected weight change.  HENT: Positive for rhinorrhea. Negative for congestion.  Eyes: Negative for visual disturbance.  Respiratory: Positive for cough. Negative for shortness of breath and wheezing.   Cardiovascular: Positive for leg swelling. Negative for chest pain and palpitations.  Gastrointestinal: Positive for abdominal pain. Negative for abdominal distention, anal bleeding, blood in stool, constipation, diarrhea, nausea, rectal pain and vomiting.  Genitourinary: Negative for dysuria.  Musculoskeletal: Positive for arthralgias and gait problem. Negative for back pain and joint swelling.  Skin: Negative for wound.  Neurological: Negative for dizziness, tremors, weakness and light-headedness.  Psychiatric/Behavioral: Positive for dysphoric mood. Negative for agitation and confusion.       Short term memory loss    Immunization History  Administered Date(s) Administered  . Influenza Inj Mdck Quad Pf 10/09/2016  . Influenza Whole 09/26/2013  . Influenza-Unspecified 09/26/2014, 10/04/2015  . Pneumococcal Conjugate-13 07/31/2016  . Pneumococcal Polysaccharide-23 12/22/2008  . Tdap 10/13/2012   Pertinent  Health Maintenance Due  Topic Date Due  . INFLUENZA VACCINE  Completed  . PNA vac Low Risk Adult  Completed   Fall Risk  06/20/2016 10/08/2015  09/28/2013  Falls in the past year? No No No  Risk for fall due to : - History of fall(s) History of fall(s)  Risk for fall due to (comments): - - 10/13 fell out of bed   Functional Status Survey:    Vitals:   01/08/17 1451  BP: (!) 116/59  Pulse: 73  Resp: 20  Temp: 97.8 F (36.6 C)  SpO2: Sonya%  Weight: 145 lb (65.8 kg)   Body mass index is 30.83 kg/m.  Wt Readings from Last 3 Encounters:  01/08/17 145 lb (65.8 kg)  12/04/16 145 lb (65.8 kg)  10/28/16 140 lb (63.5 kg)   Physical Exam  Constitutional: She is oriented to person, place, and time. No distress.  HENT:  Head: Normocephalic and atraumatic.  Neck: No JVD present.  Cardiovascular: Normal rate.   No murmur heard. BLE edema +1  Pulmonary/Chest: Effort normal. She has rales (fine bibasilar ).  Abdominal: Soft. Bowel sounds are normal. She exhibits no distension.  Neurological: She is alert and oriented to person, place, and time.  Skin: Skin is warm and dry. She is not diaphoretic.  Venous stasis changes to BLE  Psychiatric: She has a normal mood and affect.  Nursing note and vitals reviewed.   Labs reviewed:  Recent Labs  03/20/16 1057 05/21/16 12/02/16  NA 143 140 144  K 4.3 4.8 4.4  BUN 20 20 14   CREATININE 0.7 0.7 0.7    Recent Labs  05/21/16 12/02/16  AST 16 18  ALT 12 10  ALKPHOS 87 97    Recent Labs  05/21/16 09/23/16 0600 12/02/16  WBC 9.4 9.7 10.6  HGB 9.2* 7.3* 9.1*  HCT 29* 22* 31*  PLT 486* 473* 492*   Lab Results  Component Value Date   TSH 1.50 12/02/2016   Lab Results  Component Value Date   HGBA1C 5.7 09/23/2016   Lab Results  Component Value Date   CHOL 150 09/23/2016   HDL 54 09/23/2016   LDLCALC 65 09/23/2016   TRIG 151 09/23/2016   CHOLHDL 3.4 02/01/2014    Significant Diagnostic Results in last 30 days:  No results found.  Assessment/Plan  Adenocarcinoma, colon ([place]) Now reporting mild RLQ pain. Declined medication for this issue and states that she  will discuss it with Dr. Watt Climes.  Her weight, condition, and appetite remain stable. She has elected no intervention for this issue due to her age.  Dysphagia S/P CVA (cerebrovascular accident) Continues with chronic cough. Continues to work with [facility] by her account but has signed a waiver for thin liquids.  No recent bouts of pna/choking.  Hiatal hernia Denies issues with this problem. Continue Nexium  Chronic diastolic heart failure Controlled. Continue Lasix 20 mg QOD.  Continue Losartan 50 mg qd  Anemia due to chronic blood loss Improved H/H.  Continue Niferex 150 mg BID  Heart palpitations Resolved. Continue metoprolol 25 mg BID  Essential hypertension Controlled. Continue current meds. Goal BP <150/90  CAD (coronary artery disease) S/P STEMI/cath/stent to RCA in 2012. No longer on antiplatelets due to GI Bleed.  Continue BB and statin (not sure if she benefits from this at her age)    Family/ staff Communication: discussed with resident  Labs/tests ordered:  NA

## 2017-01-09 NOTE — Assessment & Plan Note (Signed)
S/P STEMI/cath/stent to RCA in 2012. No longer on antiplatelets due to GI Bleed.  Continue BB and statin (not sure if she benefits from this at her age)

## 2017-01-09 NOTE — Assessment & Plan Note (Signed)
Resolved. Continue metoprolol 25 mg BID

## 2017-01-09 NOTE — Assessment & Plan Note (Signed)
Controlled. Continue current meds. Goal BP <150/90

## 2017-01-09 NOTE — Assessment & Plan Note (Signed)
Denies issues with this problem. Continue Nexium

## 2017-01-09 NOTE — Assessment & Plan Note (Signed)
Now reporting mild RLQ pain. Declined medication for this issue and states that she will discuss it with Dr. Watt Climes.  Her weight, condition, and appetite remain stable. She has elected no intervention for this issue due to her age.

## 2017-01-09 NOTE — Assessment & Plan Note (Signed)
Continues with chronic cough. Continues to work with [facility] by her account but has signed a waiver for thin liquids.  No recent bouts of pna/choking.

## 2017-01-09 NOTE — Assessment & Plan Note (Addendum)
Controlled. Continue Lasix 20 mg QOD.  Continue Losartan 50 mg qd

## 2017-01-09 NOTE — Assessment & Plan Note (Signed)
Improved H/H.  Continue Niferex 150 mg BID

## 2017-01-12 DIAGNOSIS — G5601 Carpal tunnel syndrome, right upper limb: Secondary | ICD-10-CM | POA: Diagnosis not present

## 2017-01-12 DIAGNOSIS — R278 Other lack of coordination: Secondary | ICD-10-CM | POA: Diagnosis not present

## 2017-01-12 DIAGNOSIS — I69393 Ataxia following cerebral infarction: Secondary | ICD-10-CM | POA: Diagnosis not present

## 2017-01-12 DIAGNOSIS — I69354 Hemiplegia and hemiparesis following cerebral infarction affecting left non-dominant side: Secondary | ICD-10-CM | POA: Diagnosis not present

## 2017-01-12 DIAGNOSIS — M6389 Disorders of muscle in diseases classified elsewhere, multiple sites: Secondary | ICD-10-CM | POA: Diagnosis not present

## 2017-01-12 DIAGNOSIS — M150 Primary generalized (osteo)arthritis: Secondary | ICD-10-CM | POA: Diagnosis not present

## 2017-01-15 DIAGNOSIS — R278 Other lack of coordination: Secondary | ICD-10-CM | POA: Diagnosis not present

## 2017-01-15 DIAGNOSIS — M6389 Disorders of muscle in diseases classified elsewhere, multiple sites: Secondary | ICD-10-CM | POA: Diagnosis not present

## 2017-01-15 DIAGNOSIS — I69354 Hemiplegia and hemiparesis following cerebral infarction affecting left non-dominant side: Secondary | ICD-10-CM | POA: Diagnosis not present

## 2017-01-15 DIAGNOSIS — M150 Primary generalized (osteo)arthritis: Secondary | ICD-10-CM | POA: Diagnosis not present

## 2017-01-15 DIAGNOSIS — I69393 Ataxia following cerebral infarction: Secondary | ICD-10-CM | POA: Diagnosis not present

## 2017-01-15 DIAGNOSIS — G5601 Carpal tunnel syndrome, right upper limb: Secondary | ICD-10-CM | POA: Diagnosis not present

## 2017-01-21 DIAGNOSIS — I69354 Hemiplegia and hemiparesis following cerebral infarction affecting left non-dominant side: Secondary | ICD-10-CM | POA: Diagnosis not present

## 2017-01-21 DIAGNOSIS — I69393 Ataxia following cerebral infarction: Secondary | ICD-10-CM | POA: Diagnosis not present

## 2017-01-21 DIAGNOSIS — M150 Primary generalized (osteo)arthritis: Secondary | ICD-10-CM | POA: Diagnosis not present

## 2017-01-21 DIAGNOSIS — R278 Other lack of coordination: Secondary | ICD-10-CM | POA: Diagnosis not present

## 2017-01-21 DIAGNOSIS — G5601 Carpal tunnel syndrome, right upper limb: Secondary | ICD-10-CM | POA: Diagnosis not present

## 2017-01-21 DIAGNOSIS — M6389 Disorders of muscle in diseases classified elsewhere, multiple sites: Secondary | ICD-10-CM | POA: Diagnosis not present

## 2017-01-22 DIAGNOSIS — R41842 Visuospatial deficit: Secondary | ICD-10-CM | POA: Diagnosis not present

## 2017-01-22 DIAGNOSIS — M150 Primary generalized (osteo)arthritis: Secondary | ICD-10-CM | POA: Diagnosis not present

## 2017-01-22 DIAGNOSIS — M6389 Disorders of muscle in diseases classified elsewhere, multiple sites: Secondary | ICD-10-CM | POA: Diagnosis not present

## 2017-01-22 DIAGNOSIS — A09 Infectious gastroenteritis and colitis, unspecified: Secondary | ICD-10-CM | POA: Diagnosis not present

## 2017-01-22 DIAGNOSIS — I69354 Hemiplegia and hemiparesis following cerebral infarction affecting left non-dominant side: Secondary | ICD-10-CM | POA: Diagnosis not present

## 2017-01-22 DIAGNOSIS — R933 Abnormal findings on diagnostic imaging of other parts of digestive tract: Secondary | ICD-10-CM | POA: Diagnosis not present

## 2017-01-22 DIAGNOSIS — D500 Iron deficiency anemia secondary to blood loss (chronic): Secondary | ICD-10-CM | POA: Diagnosis not present

## 2017-01-22 DIAGNOSIS — I69393 Ataxia following cerebral infarction: Secondary | ICD-10-CM | POA: Diagnosis not present

## 2017-01-22 DIAGNOSIS — G5601 Carpal tunnel syndrome, right upper limb: Secondary | ICD-10-CM | POA: Diagnosis not present

## 2017-01-22 DIAGNOSIS — R278 Other lack of coordination: Secondary | ICD-10-CM | POA: Diagnosis not present

## 2017-01-26 DIAGNOSIS — M150 Primary generalized (osteo)arthritis: Secondary | ICD-10-CM | POA: Diagnosis not present

## 2017-01-26 DIAGNOSIS — G5601 Carpal tunnel syndrome, right upper limb: Secondary | ICD-10-CM | POA: Diagnosis not present

## 2017-01-26 DIAGNOSIS — R278 Other lack of coordination: Secondary | ICD-10-CM | POA: Diagnosis not present

## 2017-01-26 DIAGNOSIS — M6389 Disorders of muscle in diseases classified elsewhere, multiple sites: Secondary | ICD-10-CM | POA: Diagnosis not present

## 2017-01-26 DIAGNOSIS — I69393 Ataxia following cerebral infarction: Secondary | ICD-10-CM | POA: Diagnosis not present

## 2017-01-26 DIAGNOSIS — I69354 Hemiplegia and hemiparesis following cerebral infarction affecting left non-dominant side: Secondary | ICD-10-CM | POA: Diagnosis not present

## 2017-01-29 ENCOUNTER — Non-Acute Institutional Stay (SKILLED_NURSING_FACILITY): Payer: Medicare Other | Admitting: Adult Health

## 2017-01-29 DIAGNOSIS — L989 Disorder of the skin and subcutaneous tissue, unspecified: Secondary | ICD-10-CM

## 2017-01-29 NOTE — Progress Notes (Signed)
Location:  Occupational psychologist of Service:  SNF (31) Provider:  Cindi Carbon, ANP [place] [phone]   REED, Jonelle Sidle, DO  Patient Care Team: Gayland Curry, DO as PCP - General (Geriatric Medicine) Well [place], MD as Consulting Physician (Cardiology) [name], NP as Nurse Practitioner (Obstetrics and Gynecology)  Extended Emergency Contact Information Primary Emergency Contact: Lowe,Susan Address: [address]          [place] [address] [name] of [place] Phone: [phone] Relation: Friend Secondary Emergency Contact: Joyce,Steve Address: [address]          [place], [place] [address] [name] of [place] Phone: [phone] Work Phone: [phone] Mobile Phone: [phone] Relation: Friend  Code Status:  DNR Goals of care: Advanced Directive information Advanced Directives 01/09/2017  Does Patient Have a Medical Advance Directive? Yes  Type of Advance Directive Out of facility DNR (pink MOST or yellow form);[place];Living will  Does patient want to make changes to medical advance directive? -  Copy of [place] in Chart? Yes  Pre-existing out of facility DNR order (yellow form or pink MOST form) Yellow form placed in chart (order not valid for inpatient use)     Chief Complaint  Patient presents with  . Acute Visit    skin lesion    HPI:  Pt is a 83 y.o. female seen today for an acute visit for left cheek skin lesion. She reports the area has been there for over a month and is itching. The itching is getting worse over time. She is not sure if it is getting bigger, as she has poor vision.  There is no fever, drainage, swelling, etc.    Past Medical History:  Diagnosis Date  . Acute bronchitis   . Acute myocardial infarction, unspecified site, episode of care unspecified 03/07/11  . Asthma   . Cardiomegaly   . CHF  (congestive heart failure) ([place])   . Chronic diastolic heart failure ([place])   . Chronic diastolic heart failure ([place]) 01/31/2014   01/2014: 2d echo: EF 0000000, grade 1 diastolic dysfunction   . Coronary atherosclerosis of native coronary artery     s/p stent diagonal branch 1996; RCA 03/07/11  . Debility, unspecified   . Depression   . Diaphragmatic hernia without mention of obstruction or gangrene   . Dysphagia S/P CVA (cerebrovascular accident) 02/14/2014  . Edema 2006  . Finger numbness 12/28/2013   Left thumb, 1, 2nd fingers  . Herpes zoster complication 123456   complicated, left eye involvement  . Hypertension   . Irritable bowel syndrome   . Macular degeneration (senile) of retina, unspecified 2012   wet  . Nontoxic uninodular goiter    negative [place] 2008  . Open wound of knee, leg (except thigh), and ankle, without mention of complication 0000000   left leg, complicated wound, extended healing, resolved 01/2013  . Other abnormal blood chemistry   . Other specified genital prolapse(618.89)    corrected with pessary  . Shoulder pain 03/30/2013  . Stroke ([place])   . TIA (transient ischemic attack)   . Transient ischemic attack (TIA), and cerebral infarction without residual deficits(V12.54) 2008   Past Surgical History:  Procedure Laterality Date  . APPENDECTOMY    . [place], 2001, 2012  . CAROTID STENT     diagonal branch of LCA  . CATARACT EXTRACTION    . CORONARY ANGIOPLASTY    .  TONSILLECTOMY AND ADENOIDECTOMY      Allergies  Allergen Reactions  . Crestor [Rosuvastatin] Other (See Comments)    Leg Weakness   . Ace Inhibitors Cough  . Plavix [Clopidogrel] Nausea And Vomiting    Allergies as of 01/29/2017      Reactions   Crestor [rosuvastatin] Other (See Comments)   Leg Weakness    Ace Inhibitors Cough   Plavix [clopidogrel] Nausea And Vomiting      Medication List       Accurate as of 01/29/17 11:59 PM. Always use your most recent med list.            antiseptic oral rinse Liqd 15 mLs by Mouth Rinse route at bedtime.   budesonide 3 MG 24 hr capsule Commonly known as:  ENTOCORT EC Take 3 mg by mouth daily.   fluocinolone 0.01 % external solution Commonly known as:  SYNALAR Apply 1-2 application topically 2 (two) times daily as needed.   furosemide 20 MG tablet Commonly known as:  LASIX Take 20 mg by mouth every other day.   hydrocortisone cream 1 % Mix with mositurelle cream then apply small amount to affected areas as needed for rash   iron polysaccharides 150 MG capsule Commonly known as:  NIFEREX Take 150 mg by mouth 2 (two) times daily. One capsule each morning   losartan 50 MG tablet Commonly known as:  COZAAR Take 50 mg by mouth daily.   LOTEMAX 0.5 % Gel Generic drug:  Loteprednol Etabonate Apply 0.5 inches to eye 2 (two) times daily. Left eye   metoprolol succinate 50 MG 24 hr tablet Commonly known as:  TOPROL-XL Take 50 mg by mouth daily. Take with or immediately following a meal.   NEXIUM 40 MG capsule Generic drug:  esomeprazole Take 40 mg by mouth daily.   nitroGLYCERIN 0.4 MG SL tablet Commonly known as:  NITROSTAT Place 0.4 mg under the tongue every 5 (five) minutes as needed for chest pain.   OPTIVE 0.5-0.9 % Soln Generic drug:  Carboxymethylcellul-Glycerin Place 1 drop into the right eye. Three times daily   PRESERVISION AREDS 2 PO Take 1 capsule by mouth 2 (two) times daily.   sertraline 50 MG tablet Commonly known as:  ZOLOFT Take 75 mg by mouth daily.   Simethicone 250 MG Caps Take by mouth 2 (two) times daily as needed (gas).   simvastatin 20 MG tablet Commonly known as:  ZOCOR Take 20 mg by mouth daily.   triamcinolone 0.025 % cream Commonly known as:  KENALOG Apply 1 application topically 2 (two) times daily.       Review of Systems  Constitutional: Negative.   Skin: Positive for color change and rash.    Immunization History  Administered Date(s) Administered   . Influenza Inj Mdck Quad Pf 10/09/2016  . Influenza Whole 09/26/2013  . Influenza-Unspecified 09/26/2014, 10/04/2015  . Pneumococcal Conjugate-13 07/31/2016  . Pneumococcal Polysaccharide-23 12/22/2008  . Tdap 10/13/2012   Pertinent  Health Maintenance Due  Topic Date Due  . INFLUENZA VACCINE  Completed  . PNA vac Low Risk Adult  Completed   Fall Risk  06/20/2016 10/08/2015 09/28/2013  Falls in the past year? No No No  Risk for fall due to : - History of fall(s) History of fall(s)  Risk for fall due to (comments): - - 10/13 fell out of bed   Functional Status Survey:    Physical Exam  Constitutional: No distress.  Skin: Skin is warm and dry. She is  not diaphoretic.  Left cheek with skin lesion round, macular, slight tan color with uniformity.  No open areas or drainage.  Not tender.      Labs reviewed:  Recent Labs  03/20/16 1057 05/21/16 12/02/16  NA 143 140 144  K 4.3 4.8 4.4  BUN 20 20 14   CREATININE 0.7 0.7 0.7    Recent Labs  05/21/16 12/02/16  AST 16 18  ALT 12 10  ALKPHOS 87 97    Recent Labs  05/21/16 09/23/16 0600 12/02/16  WBC 9.4 9.7 10.6  HGB 9.2* 7.3* 9.1*  HCT 29* 22* 31*  PLT 486* 473* 492*   Lab Results  Component Value Date   TSH 1.50 12/02/2016   Lab Results  Component Value Date   HGBA1C 5.7 09/23/2016   Lab Results  Component Value Date   CHOL 150 09/23/2016   HDL 54 09/23/2016   LDLCALC 65 09/23/2016   TRIG 151 09/23/2016   CHOLHDL 3.4 02/01/2014    Significant Diagnostic Results in last 30 days:  No results found.  Assessment/Plan 1. Skin lesion of face Triamcinolone 0.1% BID for 1 week to left cheek for itching Refer to derm for left cheek lesion ? AK vs SCC    Family/ staff Communication: discussed with staff/resident  Labs/tests ordered:  NA

## 2017-01-30 ENCOUNTER — Encounter: Payer: Self-pay | Admitting: Adult Health

## 2017-02-18 ENCOUNTER — Non-Acute Institutional Stay (SKILLED_NURSING_FACILITY): Payer: Medicare Other | Admitting: Internal Medicine

## 2017-02-18 ENCOUNTER — Encounter: Payer: Self-pay | Admitting: Internal Medicine

## 2017-02-18 DIAGNOSIS — C189 Malignant neoplasm of colon, unspecified: Secondary | ICD-10-CM | POA: Diagnosis not present

## 2017-02-18 DIAGNOSIS — I5032 Chronic diastolic (congestive) heart failure: Secondary | ICD-10-CM | POA: Diagnosis not present

## 2017-02-18 DIAGNOSIS — E7800 Pure hypercholesterolemia, unspecified: Secondary | ICD-10-CM

## 2017-02-18 DIAGNOSIS — H353290 Exudative age-related macular degeneration, unspecified eye, stage unspecified: Secondary | ICD-10-CM | POA: Diagnosis not present

## 2017-02-18 DIAGNOSIS — I69391 Dysphagia following cerebral infarction: Secondary | ICD-10-CM | POA: Diagnosis not present

## 2017-02-18 DIAGNOSIS — D500 Iron deficiency anemia secondary to blood loss (chronic): Secondary | ICD-10-CM

## 2017-02-18 NOTE — Progress Notes (Signed)
Patient ID: Sonya [name], female   DOB: [birth year], 83 y.o.   MRN: [id number]  Location:  [facility] Room Number: 124 Place of Service:  SNF (31) Provider:  Panayiotis Rainville L. Mariea Clonts, D.O., C.M.D.  Hollace Kinnier, DO  Patient Care Team: Gayland Curry, DO as PCP - General (Geriatric Medicine) Well [place], MD as Consulting Physician (Cardiology) [name], NP as Nurse Practitioner (Obstetrics and Gynecology)  Extended Emergency Contact Information Primary Emergency Contact: Lowe,Susan Address: [address]          [place] [address] [name] of [place] Phone: [phone] Relation: Friend Secondary Emergency Contact: Joyce,Steve Address: [address]          [place], [place] [address] [name] of [place] Phone: [phone] Work Phone: [phone] Mobile Phone: [phone] Relation: Friend  Code Status:  DNR Goals of care: Advanced Directive information Advanced Directives 02/18/2017  Does Patient Have a Medical Advance Directive? Yes  Type of Paramedic of [place];Out of facility DNR (pink MOST or yellow form)  Does patient want to make changes to medical advance directive? -  Copy of [place] in Chart? Yes  Pre-existing out of facility DNR order (yellow form or pink MOST form) Yellow form placed in chart (order not valid for inpatient use)   Chief Complaint  Patient presents with  . Medical Management of Chronic Issues    routine visit    HPI:  Pt is a 83 y.o. female seen today for medical management of chronic diseases.    Lives in skilled care due to her prior stroke, needs hoyer for transfers  Colon cancer:  She is doing quite well. She has occasional flatus, but improved with less chocolate intake.  She has occasional right lower quadrant pain, as well, but short-lived and no other associated symptoms.  Budesonide helping her by  report.  The colon mass was found on CT of the abd 06/12/16 on the right ascending colon.  followed by Dr. Watt Climes  Iron deficiency anemia:  Continues on iron supplement bid.  Lab Results  Component Value Date   HCT 31 (A) 12/02/2016   Hyperlipidemia:  Continues on zocor.  No myalgias.    Has had prior stroke with residual dysphagia and dysarthria.   regular thin liquids, signed waiver Followed by speech therapy  Chronic diastolic chf:  Has been stable w/o exacerbations.  Continues on lasix, cozaar.  2D echo 2015 ef 55-60% with grade 1 DD   Chemistry      Component Value Date/Time   NA 144 12/02/2016   K 4.4 12/02/2016   CL 108 11/07/2015 1131   CO2 24 11/07/2015 1131   BUN 14 12/02/2016   CREATININE 0.7 12/02/2016   CREATININE 0.88 11/07/2015 1131   GLU 98 12/02/2016      Component Value Date/Time   CALCIUM 8.6 (L) 11/07/2015 1131   ALKPHOS 97 12/02/2016   AST 18 12/02/2016   ALT 10 12/02/2016   BILITOT 0.4 02/23/2015 1419     Vision remains poor from macular degeneration.  Wears glasses.     Past Medical History:  Diagnosis Date  . Acute bronchitis   . Acute myocardial infarction, unspecified site, episode of care unspecified 03/07/11  . Asthma   . Cardiomegaly   . CHF (congestive heart failure) ([place])   . Chronic diastolic heart failure ([place])   . Chronic diastolic heart failure ([place]) 01/31/2014   01/2014: 2d  echo: EF 0000000, grade 1 diastolic dysfunction   . Coronary atherosclerosis of native coronary artery     s/p stent diagonal branch 1996; RCA 03/07/11  . Debility, unspecified   . Depression   . Diaphragmatic hernia without mention of obstruction or gangrene   . Dysphagia S/P CVA (cerebrovascular accident) 02/14/2014  . Edema 2006  . Finger numbness 12/28/2013   Left thumb, 1, 2nd fingers  . Herpes zoster complication 123456   complicated, left eye involvement  . Hypertension   . Irritable bowel syndrome   . Macular degeneration (senile) of retina, unspecified 2012    wet  . Nontoxic uninodular goiter    negative [place] 2008  . Open wound of knee, leg (except thigh), and ankle, without mention of complication 0000000   left leg, complicated wound, extended healing, resolved 01/2013  . Other abnormal blood chemistry   . Other specified genital prolapse(618.89)    corrected with pessary  . Shoulder pain 03/30/2013  . Stroke ([place])   . TIA (transient ischemic attack)   . Transient ischemic attack (TIA), and cerebral infarction without residual deficits(V12.54) 2008   Past Surgical History:  Procedure Laterality Date  . APPENDECTOMY    . [place], 2001, 2012  . CAROTID STENT     diagonal branch of LCA  . CATARACT EXTRACTION    . CORONARY ANGIOPLASTY    . TONSILLECTOMY AND ADENOIDECTOMY      Allergies  Allergen Reactions  . Crestor [Rosuvastatin] Other (See Comments)    Leg Weakness   . Ace Inhibitors Cough  . Plavix [Clopidogrel] Nausea And Vomiting    Allergies as of 02/18/2017      Reactions   Crestor [rosuvastatin] Other (See Comments)   Leg Weakness    Ace Inhibitors Cough   Plavix [clopidogrel] Nausea And Vomiting      Medication List       Accurate as of 02/18/17 11:06 AM. Always use your most recent med list.          acetaminophen 500 MG tablet Commonly known as:  TYLENOL Take 1,000 mg by mouth every 8 (eight) hours as needed.   antiseptic oral rinse Liqd 15 mLs by Mouth Rinse route at bedtime.   budesonide 3 MG 24 hr capsule Commonly known as:  ENTOCORT EC Take 3 mg by mouth daily.   carboxymethylcellulose 0.5 % Soln Commonly known as:  REFRESH PLUS Place 1 drop into the right eye 2 (two) times daily as needed.   fluocinolone 0.01 % external solution Commonly known as:  SYNALAR Apply 1-2 application topically 2 (two) times daily as needed.   furosemide 20 MG tablet Commonly known as:  LASIX Take 20 mg by mouth every other day.   hydrocortisone cream 1 % Mix with mositurelle cream then apply  small amount to affected areas as needed for rash   iron polysaccharides 150 MG capsule Commonly known as:  NIFEREX Take 150 mg by mouth 2 (two) times daily. One capsule each morning   losartan 50 MG tablet Commonly known as:  COZAAR Take 50 mg by mouth daily.   LOTEMAX 0.5 % Gel Generic drug:  Loteprednol Etabonate Apply 0.5 inches to eye 2 (two) times daily. Left eye   metoprolol succinate 50 MG 24 hr tablet Commonly known as:  TOPROL-XL Take 50 mg by mouth daily. Take with or immediately following a meal.   NEXIUM 40 MG capsule Generic drug:  esomeprazole Take 40 mg by mouth daily.  nitroGLYCERIN 0.4 MG SL tablet Commonly known as:  NITROSTAT Place 0.4 mg under the tongue every 5 (five) minutes as needed for chest pain.   PRESERVISION AREDS 2 PO Take 1 capsule by mouth 2 (two) times daily.   sertraline 50 MG tablet Commonly known as:  ZOLOFT Take 75 mg by mouth daily.   simvastatin 20 MG tablet Commonly known as:  ZOCOR Take 20 mg by mouth daily.   triamcinolone 0.025 % cream Commonly known as:  KENALOG Apply 1 application topically 2 (two) times daily.       Review of Systems  Constitutional: Positive for fatigue. Negative for activity change, appetite change, chills and fever.  HENT: Positive for rhinorrhea. Negative for congestion.   Eyes: Positive for visual disturbance.  Respiratory: Negative for shortness of breath.   Cardiovascular: Negative for chest pain, palpitations and leg swelling.  Gastrointestinal: Positive for abdominal pain. Negative for abdominal distention, blood in stool, constipation, diarrhea, nausea and vomiting.       Colon cancer  Genitourinary: Negative for dysuria.       Incontinence  Musculoskeletal: Positive for arthralgias. Negative for back pain.  Neurological: Positive for speech difficulty and weakness. Negative for dizziness.  Hematological: Bruises/bleeds easily.  Psychiatric/Behavioral:       Short term memory loss     Immunization History  Administered Date(s) Administered  . Influenza Inj Mdck Quad Pf 10/09/2016  . Influenza Whole 09/26/2013  . Influenza-Unspecified 09/26/2014, 10/04/2015  . Pneumococcal Conjugate-13 07/31/2016  . Pneumococcal Polysaccharide-23 12/22/2008  . Tdap 10/13/2012   Pertinent  Health Maintenance Due  Topic Date Due  . INFLUENZA VACCINE  Completed  . PNA vac Low Risk Adult  Completed   Fall Risk  06/20/2016 10/08/2015 09/28/2013  Falls in the past year? No No No  Risk for fall due to : - History of fall(s) History of fall(s)  Risk for fall due to (comments): - - 10/13 fell out of bed   Functional Status Survey:    Vitals:   02/18/17 1101  BP: 140/76  Pulse: 72  Resp: 16  Temp: 97.1 F (36.2 C)  TempSrc: Oral  SpO2: 95%  Weight: 147 lb (66.7 kg)   Body mass index is 31.26 kg/m. Physical Exam  Constitutional: She is oriented to person, place, and time. She appears well-developed and well-nourished. No distress.  Eyes:  glasses  Cardiovascular: Normal rate, regular rhythm, normal heart sounds and intact distal pulses.   Wears compression hose  Pulmonary/Chest: Effort normal and breath sounds normal. No respiratory distress.  Abdominal: Soft. Bowel sounds are normal. She exhibits no distension. There is no tenderness. There is no rebound and no guarding.  Musculoskeletal: Normal range of motion.  Neurological: She is alert and oriented to person, place, and time.  Sometimes repeats things between visits; dysarthria  Skin: Skin is warm and dry. There is pallor.  Psychiatric: She has a normal mood and affect.    Labs reviewed:  Recent Labs  03/20/16 1057 05/21/16 12/02/16  NA 143 140 144  K 4.3 4.8 4.4  BUN 20 20 14   CREATININE 0.7 0.7 0.7    Recent Labs  05/21/16 12/02/16  AST 16 18  ALT 12 10  ALKPHOS 87 97    Recent Labs  05/21/16 09/23/16 0600 12/02/16  WBC 9.4 9.7 10.6  HGB 9.2* 7.3* 9.1*  HCT 29* 22* 31*  PLT 486* 473* 492*    Lab Results  Component Value Date   TSH 1.50 12/02/2016  Lab Results  Component Value Date   HGBA1C 5.7 09/23/2016   Lab Results  Component Value Date   CHOL 150 09/23/2016   HDL 54 09/23/2016   LDLCALC 65 09/23/2016   TRIG 151 09/23/2016   CHOLHDL 3.4 02/01/2014   Assessment/Plan 1. Adenocarcinoma, colon ([place]) -has occasional RLQ abd pain and flatus, no other complaints -cont budesonide and following with Dr. Norva Pavlov  2. Chronic diastolic heart failure (HCC) -stable, cont lasix and cozaar  3. Dysphagia S/P CVA (cerebrovascular accident) -cont statin and bp control, but if pt c/o pill burden would d/c statin therapy  4. Pure hypercholesterolemia -cont zocor for now as she has no related side effects and does not c/o swallowing pills   5. Anemia due to chronic blood loss -from colon ca, cont iron supplement bid  -weight is not trending down  6. AMD (age-related macular degeneration), wet ([place]) -continues on preservision, lotemax gel and refresh drops  Family/ staff Communication: discussed with nursing staff  Labs/tests ordered:  No new

## 2017-03-05 DIAGNOSIS — R933 Abnormal findings on diagnostic imaging of other parts of digestive tract: Secondary | ICD-10-CM | POA: Diagnosis not present

## 2017-03-05 DIAGNOSIS — D500 Iron deficiency anemia secondary to blood loss (chronic): Secondary | ICD-10-CM | POA: Diagnosis not present

## 2017-04-01 DIAGNOSIS — D500 Iron deficiency anemia secondary to blood loss (chronic): Secondary | ICD-10-CM | POA: Diagnosis not present

## 2017-04-02 ENCOUNTER — Encounter: Payer: Self-pay | Admitting: Adult Health

## 2017-04-02 ENCOUNTER — Non-Acute Institutional Stay (SKILLED_NURSING_FACILITY): Payer: Medicare Other | Admitting: Adult Health

## 2017-04-02 DIAGNOSIS — I2510 Atherosclerotic heart disease of native coronary artery without angina pectoris: Secondary | ICD-10-CM

## 2017-04-02 DIAGNOSIS — D500 Iron deficiency anemia secondary to blood loss (chronic): Secondary | ICD-10-CM

## 2017-04-02 DIAGNOSIS — I2583 Coronary atherosclerosis due to lipid rich plaque: Secondary | ICD-10-CM

## 2017-04-02 DIAGNOSIS — C189 Malignant neoplasm of colon, unspecified: Secondary | ICD-10-CM

## 2017-04-02 DIAGNOSIS — I679 Cerebrovascular disease, unspecified: Secondary | ICD-10-CM

## 2017-04-06 NOTE — Progress Notes (Addendum)
Location:  Occupational psychologist of Service:  SNF (31) Provider:   Cindi Carbon, ANP [place] [phone]   REED, Jonelle Sidle, DO  Patient Care Team: Gayland Curry, DO as PCP - General (Geriatric Medicine) Well [place], MD as Consulting Physician (Cardiology) [name], NP as Nurse Practitioner (Obstetrics and Gynecology)  Extended Emergency Contact Information Primary Emergency Contact: Lowe,Susan Address: [address]          [place] [address] [name] of [place] Phone: [phone] Relation: Friend Secondary Emergency Contact: Joyce,Steve Address: [address]          [place], [place] [address] [name] of [place] Phone: [phone] Work Phone: [phone] Mobile Phone: [phone] Relation: Friend  Code Status:  DNR Goals of care: Advanced Directive information Advanced Directives 04/02/2017  Does Patient Have a Medical Advance Directive? Yes  Type of Paramedic of [place];Out of facility DNR (pink MOST or yellow form)  Does patient want to make changes to medical advance directive? -  Copy of [place] in Chart? Yes  Pre-existing out of facility DNR order (yellow form or pink MOST form) Yellow form placed in chart (order not valid for inpatient use)     Chief Complaint  Patient presents with  . Medical Management of Chronic Issues    HPI:  Pt is a 83 y.o. female seen today for management of chronic diseases. Resides in memory due to weakness after CVA.   The colon mass was found on CT of the abd 06/12/16 on the right ascending colon. No further treatment was pursued due to her age. She is followed by Dr. Watt Climes.  Her weight is down 3-5 lbs over the past month. She reports feeling nauseous at times in the afternoon but no vomiting.  Also reports occasional RLQ pain. She is on budesonide prescribed by Dr. Watt Climes.   2D echo 2015 ef  55-60% with grade 1 DD. Denies sob and chest pain. Weight trending down. Has mild leg edema but refuses compression hose. MMSE 30/30 on 12/24/16, passed clock. Does have short term memory loss but remains able to make her own decisions, follow commands, etc.  Anemia: H/H improved on iron supplementation BID.  9.1/31 12/02/16. Labs were drawn at Dr. Perley Jain office and we have asked them to fax [place] a copy. No bloody or dark stools. Hx of carotid stent, CVA, coronary angioplasty: no longer on antiplatelet therapy due to dropping H/H with heme pos stools.  Remains on zocor 20 mg qd. LDL 65  Past Medical History:  Diagnosis Date  . Acute bronchitis   . Acute myocardial infarction, unspecified site, episode of care unspecified 03/07/11  . Asthma   . Cardiomegaly   . CHF (congestive heart failure) ([place])   . Chronic diastolic heart failure ([place])   . Chronic diastolic heart failure ([place]) 01/31/2014   01/2014: 2d echo: EF 51-02%, grade 1 diastolic dysfunction   . Coronary atherosclerosis of native coronary artery     s/p stent diagonal branch 1996; RCA 03/07/11  . Debility, unspecified   . Depression   . Diaphragmatic hernia without mention of obstruction or gangrene   . Dysphagia S/P CVA (cerebrovascular accident) 02/14/2014  . Edema 2006  . Finger numbness 12/28/2013   Left thumb, 1, 2nd fingers  . Herpes zoster complication 5852   complicated, left eye involvement  . Hypertension   . Irritable bowel syndrome   . Macular  degeneration (senile) of retina, unspecified 2012   wet  . Nontoxic uninodular goiter    negative [place] 2008  . Open wound of knee, leg (except thigh), and ankle, without mention of complication 02/7047   left leg, complicated wound, extended healing, resolved 01/2013  . Other abnormal blood chemistry   . Other specified genital prolapse(618.89)    corrected with pessary  . Shoulder pain 03/30/2013  . Stroke ([place])   . TIA (transient ischemic attack)   . Transient ischemic attack  (TIA), and cerebral infarction without residual deficits(V12.54) 2008   Past Surgical History:  Procedure Laterality Date  . APPENDECTOMY    . [place], 2001, 2012  . CAROTID STENT     diagonal branch of LCA  . CATARACT EXTRACTION    . CORONARY ANGIOPLASTY    . TONSILLECTOMY AND ADENOIDECTOMY      Allergies  Allergen Reactions  . Crestor [Rosuvastatin] Other (See Comments)    Leg Weakness   . Ace Inhibitors Cough  . Plavix [Clopidogrel] Nausea And Vomiting    Allergies as of 04/02/2017      Reactions   Crestor [rosuvastatin] Other (See Comments)   Leg Weakness    Ace Inhibitors Cough   Plavix [clopidogrel] Nausea And Vomiting      Medication List       Accurate as of 04/02/17 11:59 PM. Always use your most recent med list.          acetaminophen 500 MG tablet Commonly known as:  TYLENOL Take 1,000 mg by mouth every 8 (eight) hours as needed.   antiseptic oral rinse Liqd 15 mLs by Mouth Rinse route at bedtime.   budesonide 3 MG 24 hr capsule Commonly known as:  ENTOCORT EC Take 3 mg by mouth daily.   carboxymethylcellulose 0.5 % Soln Commonly known as:  REFRESH PLUS Place 1 drop into the right eye 2 (two) times daily as needed.   fluocinolone 0.01 % external solution Commonly known as:  SYNALAR Apply 1-2 application topically 2 (two) times daily as needed.   furosemide 20 MG tablet Commonly known as:  LASIX Take 20 mg by mouth every other day.   hydrocortisone cream 1 % Mix with mositurelle cream then apply small amount to affected areas as needed for rash   iron polysaccharides 150 MG capsule Commonly known as:  NIFEREX Take 150 mg by mouth 2 (two) times daily. One capsule each morning   losartan 50 MG tablet Commonly known as:  COZAAR Take 50 mg by mouth daily.   LOTEMAX 0.5 % Gel Generic drug:  Loteprednol Etabonate Apply 0.5 inches to eye 2 (two) times daily. Left eye   metoprolol succinate 50 MG 24 hr tablet Commonly  known as:  TOPROL-XL Take 50 mg by mouth daily. Take with or immediately following a meal.   NEXIUM 40 MG capsule Generic drug:  esomeprazole Take 40 mg by mouth daily.   nitroGLYCERIN 0.4 MG SL tablet Commonly known as:  NITROSTAT Place 0.4 mg under the tongue every 5 (five) minutes as needed for chest pain.   PRESERVISION AREDS 2 PO Take 1 capsule by mouth 2 (two) times daily.   sertraline 50 MG tablet Commonly known as:  ZOLOFT Take 75 mg by mouth daily.   simvastatin 20 MG tablet Commonly known as:  ZOCOR Take 20 mg by mouth daily.   triamcinolone 0.025 % cream Commonly known as:  KENALOG Apply 1 application topically 2 (two) times daily.  Review of Systems  Constitutional: Negative for activity change, appetite change, chills, diaphoresis, fatigue, fever and unexpected weight change.  HENT: Positive for rhinorrhea. Negative for congestion.   Eyes: Negative for visual disturbance.  Respiratory: Positive for cough (chronic). Negative for shortness of breath and wheezing.   Cardiovascular: Positive for leg swelling. Negative for chest pain and palpitations.  Gastrointestinal: Positive for abdominal pain and nausea. Negative for abdominal distention, anal bleeding, blood in stool, constipation, diarrhea and rectal pain.  Genitourinary: Negative for dysuria.  Musculoskeletal: Positive for arthralgias and gait problem. Negative for back pain and joint swelling.  Skin: Negative for wound.  Neurological: Negative for dizziness, tremors, weakness and light-headedness.  Psychiatric/Behavioral: Positive for dysphoric mood. Negative for agitation and confusion.       Short term memory loss    Immunization History  Administered Date(s) Administered  . Influenza Inj Mdck Quad Pf 10/09/2016  . Influenza Whole 09/26/2013  . Influenza-Unspecified 09/26/2014, 10/04/2015  . Pneumococcal Conjugate-13 07/31/2016  . Pneumococcal Polysaccharide-23 12/22/2008  . Tdap 10/13/2012     Pertinent  Health Maintenance Due  Topic Date Due  . INFLUENZA VACCINE  07/22/2017  . PNA vac Low Risk Adult  Completed   Fall Risk  06/20/2016 10/08/2015 09/28/2013  Falls in the past year? No No No  Risk for fall due to : - History of fall(s) History of fall(s)  Risk for fall due to (comments): - - 10/13 fell out of bed   Functional Status Survey:    Vitals:   04/02/17 1033  BP: (!) 143/72  Pulse: 82  Resp: 14  Temp: (!) 96.9 F (36.1 C)  SpO2: 98%  Weight: 142 lb (64.4 kg)   Body mass index is 30.2 kg/m.  Wt Readings from Last 3 Encounters:  04/02/17 142 lb (64.4 kg)  02/18/17 147 lb (66.7 kg)  01/08/17 145 lb (65.8 kg)   Physical Exam  Constitutional: She is oriented to person, place, and time. No distress.  HENT:  Head: Normocephalic and atraumatic.  Neck: No JVD present.  Cardiovascular: Normal rate.   No murmur heard. BLE edema +1  Pulmonary/Chest: Effort normal. She has rales (fine bibasilar ).  Abdominal: Soft. Bowel sounds are normal. She exhibits no distension.  Neurological: She is alert and oriented to person, place, and time.  Skin: Skin is warm and dry. She is not diaphoretic.  Venous stasis changes to BLE  Psychiatric: She has a normal mood and affect.  Nursing note and vitals reviewed.   Labs reviewed:  Recent Labs  05/21/16 12/02/16  NA 140 144  K 4.8 4.4  BUN 20 14  CREATININE 0.7 0.7    Recent Labs  05/21/16 12/02/16  AST 16 18  ALT 12 10  ALKPHOS 87 97    Recent Labs  05/21/16 09/23/16 0600 12/02/16  WBC 9.4 9.7 10.6  HGB 9.2* 7.3* 9.1*  HCT 29* 22* 31*  PLT 486* 473* 492*   Lab Results  Component Value Date   TSH 1.50 12/02/2016   Lab Results  Component Value Date   HGBA1C 5.7 09/23/2016   Lab Results  Component Value Date   CHOL 150 09/23/2016   HDL 54 09/23/2016   LDLCALC 65 09/23/2016   TRIG 151 09/23/2016   CHOLHDL 3.4 02/01/2014    Significant Diagnostic Results in last 30 days:  No results  found.  Assessment/Plan  1. Coronary artery disease due to lipid rich plaque No symptoms, doing quite well for 83 y.o. Continue Metoprolol  50 mg qd and Zocor 20 mg qd  Followed by cards (she makes the apts)  2. Cerebrovascular disease S/P CVA No aggressive care but will remain on zocor and BB for prevention, not able to take aspirin   3. Adenocarcinoma, colon ([place]) Followed by Dr. Watt Climes. We are now seeing some RLQ pain and some mild nausea with 3 lb weight loss and slight decrease in appetite. She has declined further intervention and refuses to take any meds for the nausea at this time Would continue the nexium and make sure she takes food for with the budesonide. I also wonder if the iron is causing the nausea.  4. Anemia due to chronic blood loss Need CBC from Dr. Watt Climes, would consider decreasing iron to see if this helps with the nausea. I have asked the staff to make sure the iron is given with food.  Family/ staff Communication: discussed with resident  Labs/tests ordered:  NA

## 2017-04-15 ENCOUNTER — Non-Acute Institutional Stay (SKILLED_NURSING_FACILITY): Payer: Medicare Other

## 2017-04-15 DIAGNOSIS — Z0000 Encounter for general adult medical examination without abnormal findings: Secondary | ICD-10-CM | POA: Diagnosis not present

## 2017-04-15 NOTE — Progress Notes (Signed)
Subjective:   Sonya [name] is a 83 y.o. female who presents for an Initial Medicare Annual Wellness Visit at [facility]  Cardiac Risk Factors include: advanced age (>69men, >81 women);family history of premature cardiovascular disease;hypertension;smoking/ tobacco exposure;sedentary lifestyle     Objective:    Today's Vitals   04/15/17 1436  BP: 130/64  Pulse: 72  Temp: (!) 96.9 F (36.1 C)  TempSrc: Oral  SpO2: 98%  Weight: 142 lb (64.4 kg)  Height: 4\' 10"  (1.473 m)   Body mass index is 29.68 kg/m.   Current Medications (verified) Outpatient Encounter Prescriptions as of 04/15/2017  Medication Sig  . acetaminophen (TYLENOL) 500 MG tablet Take 1,000 mg by mouth every 8 (eight) hours as needed.  Marland Kitchen antiseptic oral rinse (BIOTENE) LIQD 15 mLs by Mouth Rinse route at bedtime.  . budesonide (ENTOCORT EC) 3 MG 24 hr capsule Take 3 mg by mouth daily.  . carboxymethylcellulose (REFRESH PLUS) 0.5 % SOLN Place 1 drop into the right eye 2 (two) times daily as needed.  Marland Kitchen esomeprazole (NEXIUM) 40 MG capsule Take 40 mg by mouth daily.  . fluocinolone (SYNALAR) 0.01 % external solution Apply 1-2 application topically 2 (two) times daily as needed.   . furosemide (LASIX) 20 MG tablet Take 20 mg by mouth every other day.   . hydrocortisone cream 1 % Mix with mositurelle cream then apply small amount to affected areas as needed for rash  . iron polysaccharides (NIFEREX) 150 MG capsule Take 150 mg by mouth 2 (two) times daily. One capsule each morning   . losartan (COZAAR) 50 MG tablet Take 50 mg by mouth daily.  . Loteprednol Etabonate (LOTEMAX) 0.5 % GEL Apply 0.5 inches to eye 2 (two) times daily. Left eye  . metoprolol succinate (TOPROL-XL) 50 MG 24 hr tablet Take 50 mg by mouth daily. Take with or immediately following a meal.  . Multiple Vitamins-Minerals (PRESERVISION AREDS 2 PO) Take 1 capsule by mouth 2 (two) times daily.  . nitroGLYCERIN (NITROSTAT) 0.4 MG SL tablet Place  0.4 mg under the tongue every 5 (five) minutes as needed for chest pain.   Marland Kitchen sertraline (ZOLOFT) 50 MG tablet Take 75 mg by mouth daily.   . simethicone (MYLICON) 606 MG chewable tablet Chew 250 mg by mouth 2 (two) times daily.  . simvastatin (ZOCOR) 20 MG tablet Take 20 mg by mouth daily.  Marland Kitchen triamcinolone (KENALOG) 0.025 % cream Apply 1 application topically 2 (two) times daily.   No facility-administered encounter medications on file as of 04/15/2017.     Allergies (verified) Crestor [rosuvastatin]; Ace inhibitors; and Plavix [clopidogrel]   History: Past Medical History:  Diagnosis Date  . Acute bronchitis   . Acute myocardial infarction, unspecified site, episode of care unspecified 03/07/11  . Asthma   . Cardiomegaly   . CHF (congestive heart failure) ([place])   . Chronic diastolic heart failure ([place])   . Chronic diastolic heart failure ([place]) 01/31/2014   01/2014: 2d echo: EF 30-16%, grade 1 diastolic dysfunction   . Coronary atherosclerosis of native coronary artery     s/p stent diagonal branch 1996; RCA 03/07/11  . Debility, unspecified   . Depression   . Diaphragmatic hernia without mention of obstruction or gangrene   . Dysphagia S/P CVA (cerebrovascular accident) 02/14/2014  . Edema 2006  . Finger numbness 12/28/2013   Left thumb, 1, 2nd fingers  . Herpes zoster complication 0109   complicated, left eye involvement  . Hypertension   .  Irritable bowel syndrome   . Macular degeneration (senile) of retina, unspecified 2012   wet  . Nontoxic uninodular goiter    negative [place] 2008  . Open wound of knee, leg (except thigh), and ankle, without mention of complication 56/3149   left leg, complicated wound, extended healing, resolved 01/2013  . Other abnormal blood chemistry   . Other specified genital prolapse(618.89)    corrected with pessary  . Shoulder pain 03/30/2013  . Stroke ([place])   . TIA (transient ischemic attack)   . Transient ischemic attack (TIA), and cerebral  infarction without residual deficits(V12.54) 2008   Past Surgical History:  Procedure Laterality Date  . APPENDECTOMY    . [place], 2001, 2012  . CAROTID STENT     diagonal branch of LCA  . CATARACT EXTRACTION    . CORONARY ANGIOPLASTY    . TONSILLECTOMY AND ADENOIDECTOMY     Family History  Problem Relation Age of Onset  . Diabetes Sister   . Cancer Mother   . Stroke Father   . Diabetes Father   . Dementia Sister    Social History   Occupational History  . 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 Smoking status: Former Smoker    Quit date: 12/23/1963  . Smokeless tobacco: Never Used  . Alcohol use 3.0 oz/week    5 Glasses of wine per week     Comment: one glass of wine at dinner   . Drug use: No  . Sexual activity: No    Tobacco Counseling Counseling given: Not Answered   Activities of Daily Living In your present state of health, do you have any difficulty performing the following activities: 04/15/2017 05/06/2016  Hearing? N Y  Vision? Y Y  Difficulty concentrating or making decisions? N Y  Walking or climbing stairs? Y Y  Dressing or bathing? Y Y  Doing errands, shopping? [place] Donning  Preparing Food and eating ? Y -  Using the Toilet? Y -  Do you have problems with loss of bowel control? Y -  Managing your Medications? Y -  Managing your Finances? Y -  Housekeeping or managing your Housekeeping? Y -  Some recent data might be hidden    Immunizations and Health Maintenance Immunization History  Administered Date(s) Administered  . Influenza Inj Mdck Quad Pf 10/09/2016  . Influenza Whole 09/26/2013  . Influenza-Unspecified 09/26/2014, 10/04/2015  . Pneumococcal Conjugate-13 07/31/2016  . Pneumococcal Polysaccharide-23 12/22/2008  . Tdap 10/13/2012   There are no preventive care reminders to display for this patient.  Patient Care Team: Gayland Curry, DO as PCP - General  (Geriatric Medicine) [facility] Jerline Pain, MD as Consulting Physician (Cardiology) [name], NP as Nurse Practitioner (Obstetrics and Gynecology)  Indicate any recent Medical Services you may have received from other than [facility] providers in the past year (date may be approximate).     Assessment:   This is a routine wellness examination for [place].   Hearing/Vision screen No exam data present  Dietary issues and exercise activities discussed: Current Exercise Habits: The patient does not participate in regular exercise at present, Exercise limited by: None identified  Goals    . Go outside          Starting 04/15/2017 pt will try to go outside more often.      Depression Screen PHQ 2/9 Scores 04/15/2017 04/15/2017  06/20/2016 10/08/2015 09/28/2013  PHQ - 2 Score 0 3 0 1 1    Fall Risk Fall Risk  04/15/2017 06/20/2016 10/08/2015 09/28/2013  Falls in the past year? No No No No  Risk for fall due to : - - History of fall(s) History of fall(s)  Risk for fall due to (comments): - - - 10/13 fell out of bed    Cognitive Function: MMSE - Mini Mental State Exam 11/23/2016  Orientation to time 5  Orientation to Place 5  Registration 3  Attention/ Calculation 5  Recall 3  Language- name 2 objects 2  Language- repeat 1  Language- follow 3 step command 3  Language- read & follow direction 1  Write a sentence 1  Copy design 1  Total score 30        Screening Tests Health Maintenance  Topic Date Due  . INFLUENZA VACCINE  07/22/2017  . TETANUS/TDAP  10/13/2022  . PNA vac Low Risk Adult  Completed      Plan:    I have personally reviewed and addressed the Medicare Annual Wellness questionnaire and have noted the following in the patient's chart:  A. Medical and social history B. Use of alcohol, tobacco or illicit drugs  C. Current medications and supplements D. Functional ability and status E.  Nutritional status F.  Physical  activity G. Advance directives H. List of other physicians I.  Hospitalizations, surgeries, and ER visits in previous 12 months J.  [place] to include hearing, vision, cognitive, depression L. Referrals and appointments - none  In addition, I have reviewed and discussed with patient certain preventive protocols, quality metrics, and best practice recommendations. A written personalized care plan for preventive services as well as general preventive health recommendations were provided to patient.  See attached scanned questionnaire for additional information.   Signed,   Rich Reining, RN Nurse Health Advisor

## 2017-04-15 NOTE — Progress Notes (Signed)
   I reviewed health advisor's note, was available for consultation and agree with the assessment and plan as written.    Mina Babula L. Arnie Maiolo, D.O. [facility] [address], [place] [address] Cell Phone (Mon-Fri 8am-5pm):  [phone] On Call:  [phone] & follow prompts after 5pm & weekends Office Phone:  [phone] Office Fax:  [phone]   Quick Notes   Health Maintenance: None  Abnormal Screen: MMSE 30/30. Did not pass clock drawing. Done on 11/23/2016   Patient Concerns: None     Nurse Concerns: None

## 2017-04-15 NOTE — Patient Instructions (Signed)
[name] , Thank you for taking time to come for your Medicare Wellness Visit. I appreciate your ongoing commitment to your health goals. Please review the following plan we discussed and let me know if I can assist you in the future.   Screening recommendations/referrals: Colonoscopy up to date Mammogram up to date Bone Density up to date Recommended yearly ophthalmology/optometry visit for glaucoma screening and checkup Recommended yearly dental visit for hygiene and checkup  Vaccinations: Influenza vaccine up to date Pneumococcal vaccine up to date Tdap vaccine up to date. Due 10/13/2022 Shingles vaccine not in records. If you want Shingrix let us know and we will put in order.  Advanced directives: In chart  Conditions/risks identified: None  Next appointment: None upcoming   Preventive Care 65 Years and Older, Female Preventive care refers to lifestyle choices and visits with your health care provider that can promote health and wellness. What does preventive care include?  A yearly physical exam. This is also called an annual well check.  Dental exams once or twice a year.  Routine eye exams. Ask your health care provider how often you should have your eyes checked.  Personal lifestyle choices, including:  Daily care of your teeth and gums.  Regular physical activity.  Eating a healthy diet.  Avoiding tobacco and drug use.  Limiting alcohol use.  Practicing safe sex.  Taking low-dose aspirin every day.  Taking vitamin and mineral supplements as recommended by your health care provider. What happens during an annual well check? The services and screenings done by your health care provider during your annual well check will depend on your age, overall health, lifestyle risk factors, and family history of disease. Counseling  Your health care provider may ask you questions about your:  Alcohol use.  Tobacco use.  Drug use.  Emotional  well-being.  Home and relationship well-being.  Sexual activity.  Eating habits.  History of falls.  Memory and ability to understand (cognition).  Work and work Statistician.  Reproductive health. Screening  You may have the following tests or measurements:  Height, weight, and BMI.  Blood pressure.  Lipid and cholesterol levels. These may be checked every 5 years, or more frequently if you are over 52 years old.  Skin check.  Lung cancer screening. You may have this screening every year starting at age 26 if you have a 30-pack-year history of smoking and currently smoke or have quit within the past 15 years.  Fecal occult blood test (FOBT) of the stool. You may have this test every year starting at age 57.  Flexible sigmoidoscopy or colonoscopy. You may have a sigmoidoscopy every 5 years or a colonoscopy every 10 years starting at age 70.  Hepatitis C blood test.  Hepatitis B blood test.  Sexually transmitted disease (STD) testing.  Diabetes screening. This is done by checking your blood sugar (glucose) after you have not eaten for a while (fasting). You may have this done every 1-3 years.  Bone density scan. This is done to screen for osteoporosis. You may have this done starting at age 19.  Mammogram. This may be done every 1-2 years. Talk to your health care provider about how often you should have regular mammograms. Talk with your health care provider about your test results, treatment options, and if necessary, the need for more tests. Vaccines  Your health care provider may recommend certain vaccines, such as:  Influenza vaccine. This is recommended every year.  Tetanus, diphtheria, and acellular pertussis (  Tdap, Td) vaccine. You may need a Td booster every 10 years.  Zoster vaccine. You may need this after age 13.  Pneumococcal 13-valent conjugate (PCV13) vaccine. One dose is recommended after age 62.  Pneumococcal polysaccharide (PPSV23) vaccine. One  dose is recommended after age 76. Talk to your health care provider about which screenings and vaccines you need and how often you need them. This information is not intended to replace advice given to you by your health care provider. Make sure you discuss any questions you have with your health care provider. Document Released: 01/04/2016 Document Revised: 08/27/2016 Document Reviewed: 10/09/2015 Elsevier Interactive Patient Education  2017 [place] Prevention in the Home Falls can cause injuries. They can happen to people of all ages. There are many things you can do to make your home safe and to help prevent falls. What can I do on the outside of my home?  Regularly fix the edges of walkways and driveways and fix any cracks.  Remove anything that might make you trip as you walk through a door, such as a raised step or threshold.  Trim any bushes or trees on the path to your home.  Use bright outdoor lighting.  Clear any walking paths of anything that might make someone trip, such as rocks or tools.  Regularly check to see if handrails are loose or broken. Make sure that both sides of any steps have handrails.  Any raised decks and porches should have guardrails on the edges.  Have any leaves, snow, or ice cleared regularly.  Use sand or salt on walking paths during winter.  Clean up any spills in your garage right away. This includes oil or grease spills. What can I do in the bathroom?  Use night lights.  Install grab bars by the toilet and in the tub and shower. Do not use towel bars as grab bars.  Use non-skid mats or decals in the tub or shower.  If you need to sit down in the shower, use a plastic, non-slip stool.  Keep the floor dry. Clean up any water that spills on the floor as soon as it happens.  Remove soap buildup in the tub or shower regularly.  Attach bath mats securely with double-sided non-slip rug tape.  Do not have throw rugs and other  things on the floor that can make you trip. What can I do in the bedroom?  Use night lights.  Make sure that you have a light by your bed that is easy to reach.  Do not use any sheets or blankets that are too big for your bed. They should not hang down onto the floor.  Have a firm chair that has side arms. You can use this for support while you get dressed.  Do not have throw rugs and other things on the floor that can make you trip. What can I do in the kitchen?  Clean up any spills right away.  Avoid walking on wet floors.  Keep items that you use a lot in easy-to-reach places.  If you need to reach something above you, use a strong step stool that has a grab bar.  Keep electrical cords out of the way.  Do not use floor polish or wax that makes floors slippery. If you must use wax, use non-skid floor wax.  Do not have throw rugs and other things on the floor that can make you trip. What can I do with my stairs?  Do  not leave any items on the stairs.  Make sure that there are handrails on both sides of the stairs and use them. Fix handrails that are broken or loose. Make sure that handrails are as long as the stairways.  Check any carpeting to make sure that it is firmly attached to the stairs. Fix any carpet that is loose or worn.  Avoid having throw rugs at the top or bottom of the stairs. If you do have throw rugs, attach them to the floor with carpet tape.  Make sure that you have a light switch at the top of the stairs and the bottom of the stairs. If you do not have them, ask someone to add them for you. What else can I do to help prevent falls?  Wear shoes that:  Do not have high heels.  Have rubber bottoms.  Are comfortable and fit you well.  Are closed at the toe. Do not wear sandals.  If you use a stepladder:  Make sure that it is fully opened. Do not climb a closed stepladder.  Make sure that both sides of the stepladder are locked into place.  Ask  someone to hold it for you, if possible.  Clearly mark and make sure that you can see:  Any grab bars or handrails.  First and last steps.  Where the edge of each step is.  Use tools that help you move around (mobility aids) if they are needed. These include:  Canes.  Walkers.  Scooters.  Crutches.  Turn on the lights when you go into a dark area. Replace any light bulbs as soon as they burn out.  Set up your furniture so you have a clear path. Avoid moving your furniture around.  If any of your floors are uneven, fix them.  If there are any pets around you, be aware of where they are.  Review your medicines with your doctor. Some medicines can make you feel dizzy. This can increase your chance of falling. Ask your doctor what other things that you can do to help prevent falls. This information is not intended to replace advice given to you by your health care provider. Make sure you discuss any questions you have with your health care provider. Document Released: 10/04/2009 Document Revised: 05/15/2016 Document Reviewed: 01/12/2015 Elsevier Interactive Patient Education  2017 [employer].

## 2017-04-30 ENCOUNTER — Other Ambulatory Visit: Payer: Self-pay | Admitting: Gastroenterology

## 2017-04-30 ENCOUNTER — Encounter: Payer: Self-pay | Admitting: Cardiology

## 2017-04-30 DIAGNOSIS — C189 Malignant neoplasm of colon, unspecified: Secondary | ICD-10-CM

## 2017-04-30 DIAGNOSIS — R933 Abnormal findings on diagnostic imaging of other parts of digestive tract: Secondary | ICD-10-CM | POA: Diagnosis not present

## 2017-04-30 DIAGNOSIS — A09 Infectious gastroenteritis and colitis, unspecified: Secondary | ICD-10-CM | POA: Diagnosis not present

## 2017-04-30 DIAGNOSIS — R1031 Right lower quadrant pain: Secondary | ICD-10-CM | POA: Diagnosis not present

## 2017-04-30 DIAGNOSIS — R9389 Abnormal findings on diagnostic imaging of other specified body structures: Secondary | ICD-10-CM

## 2017-04-30 DIAGNOSIS — D500 Iron deficiency anemia secondary to blood loss (chronic): Secondary | ICD-10-CM | POA: Diagnosis not present

## 2017-04-30 LAB — CBC AND DIFFERENTIAL
HCT: 26 % — AB (ref 36–46)
Hemoglobin: 8.4 g/dL — AB (ref 12.0–16.0)
PLATELETS: 577 10*3/uL — AB (ref 150–399)
WBC: 10.4 10*3/mL

## 2017-04-30 LAB — BASIC METABOLIC PANEL
BUN: 20 mg/dL (ref 4–21)
CREATININE: 0.7 mg/dL (ref 0.5–1.1)
Glucose: 98 mg/dL
Potassium: 4.8 mmol/L (ref 3.4–5.3)
Sodium: 141 mmol/L (ref 137–147)

## 2017-04-30 LAB — HEPATIC FUNCTION PANEL
ALT: 12 U/L (ref 7–35)
AST: 14 U/L (ref 13–35)
Alkaline Phosphatase: 79 U/L (ref 25–125)
BILIRUBIN, TOTAL: 0.2 mg/dL

## 2017-05-05 ENCOUNTER — Encounter: Payer: Self-pay | Admitting: Internal Medicine

## 2017-05-05 ENCOUNTER — Non-Acute Institutional Stay (SKILLED_NURSING_FACILITY): Payer: Medicare Other | Admitting: Internal Medicine

## 2017-05-05 DIAGNOSIS — R000 Tachycardia, unspecified: Secondary | ICD-10-CM | POA: Diagnosis not present

## 2017-05-05 DIAGNOSIS — I259 Chronic ischemic heart disease, unspecified: Secondary | ICD-10-CM

## 2017-05-05 DIAGNOSIS — C189 Malignant neoplasm of colon, unspecified: Secondary | ICD-10-CM

## 2017-05-05 DIAGNOSIS — R9431 Abnormal electrocardiogram [ECG] [EKG]: Secondary | ICD-10-CM | POA: Diagnosis not present

## 2017-05-05 NOTE — Progress Notes (Signed)
Patient ID: Sonya [name], female   DOB: 1913/01/11, 83 y.o.   MRN: [id number]  Location:  [facility] Room Number: 124 Place of Service:  SNF [phone]) Provider:  Gayland Curry, DO  Patient Care Team: Gayland Curry, DO as PCP - General (Geriatric Medicine) [facility] [place] [place], Thana Farr, MD as Consulting Physician (Cardiology) [name], NP as Nurse Practitioner (Obstetrics and Gynecology) Clarene Essex, MD as Consulting Physician (Gastroenterology)  Extended Emergency Contact Information Primary Emergency Contact: Lowe,Susan Address: [address]          [place] [address] [name] of [place] Phone: [phone] Relation: Friend Secondary Emergency Contact: Joyce,Steve Address: [address]          [place], [place] [address] [name] of [place] Phone: [phone] Work Phone: [phone] Mobile Phone: [phone] Relation: Friend  Code Status:  DNR Goals of care: Advanced Directive information Advanced Directives 05/05/2017  Does Patient Have a Medical Advance Directive? Yes  Type of Paramedic of [place];Living will;Out of facility DNR (pink MOST or yellow form)  Does patient want to make changes to medical advance directive? -  Copy of [place] in Chart? Yes  Pre-existing out of facility DNR order (yellow form or pink MOST form) Yellow form placed in chart (order not valid for inpatient use)   Chief Complaint  Patient presents with  . Acute Visit    fast heart rate    HPI:  Pt is a 83 y.o. female seen today for an acute visit for feeling her heart fluttering too fast last night.  Sonya [name] has a h/o atrial flutter at one point and some other episodes of sinus tach, colon mass (has f/u CT tomorrow), iron deficiency anemia seen for an acute visit after she had palpitations last night and HR was 147 on her EKG in sinus tach with ST depression in lateral  leads(V2-6) flattened T wave in V5--this was obtained last night at 2023 in her room, but reviewed this am around 10am. She was given a dose of lopressor 25mg  once and her HR slowed to normal. She had refused hospitalization at that time.  When I saw her, she was eating breakfast and feeling fine.  HR wnl at 72.  She was more concerned about drinking the contrast for her CT abdomen in the morning than anything else.  She kept saying she sees Dr. Marlou Porch on 6/1.  She has no chest pain or dyspnea and did not last night either.  She does report some occasional pain in her RLQ where her colon mass is located.  She continues to eat ice cream.  She agrees to [facility] with CKMB and troponin, but did not want a sooner appt unless they are abnormal.   Past Medical History:  Diagnosis Date  . Acute bronchitis   . Acute myocardial infarction, unspecified site, episode of care unspecified 03/07/11  . Asthma   . Cardiomegaly   . CHF (congestive heart failure) ([place])   . Chronic diastolic heart failure ([place])   . Chronic diastolic heart failure ([place]) 01/31/2014   01/2014: 2d echo: EF 38-75%, grade 1 diastolic dysfunction   . Coronary atherosclerosis of native coronary artery     s/p stent diagonal branch 1996; RCA 03/07/11  . Debility, unspecified   . Depression   . Diaphragmatic hernia without mention of obstruction or gangrene   . Dysphagia S/P CVA (cerebrovascular accident) 02/14/2014  . Edema 2006  .  Finger numbness 12/28/2013   Left thumb, 1, 2nd fingers  . Herpes zoster complication 0093   complicated, left eye involvement  . Hypertension   . Irritable bowel syndrome   . Macular degeneration (senile) of retina, unspecified 2012   wet  . Nontoxic uninodular goiter    negative [place] 2008  . Open wound of knee, leg (except thigh), and ankle, without mention of complication 81/8299   left leg, complicated wound, extended healing, resolved 01/2013  . Other abnormal blood chemistry   . Other specified genital  prolapse(618.89)    corrected with pessary  . Shoulder pain 03/30/2013  . Stroke ([place])   . TIA (transient ischemic attack)   . Transient ischemic attack (TIA), and cerebral infarction without residual deficits(V12.54) 2008   Past Surgical History:  Procedure Laterality Date  . APPENDECTOMY    . [place], 2001, 2012  . CAROTID STENT     diagonal branch of LCA  . CATARACT EXTRACTION    . CORONARY ANGIOPLASTY    . TONSILLECTOMY AND ADENOIDECTOMY      Allergies  Allergen Reactions  . Crestor [Rosuvastatin] Other (See Comments)    Leg Weakness   . Ace Inhibitors Cough  . Plavix [Clopidogrel] Nausea And Vomiting    Outpatient Encounter Prescriptions as of 05/05/2017  Medication Sig  . acetaminophen (TYLENOL) 500 MG tablet Take 1,000 mg by mouth every 8 (eight) hours as needed.  Marland Kitchen antiseptic oral rinse (BIOTENE) LIQD 15 mLs by Mouth Rinse route at bedtime.  . budesonide (ENTOCORT EC) 3 MG 24 hr capsule Take 3 mg by mouth daily.  . carboxymethylcellulose (REFRESH PLUS) 0.5 % SOLN Place 1 drop into the right eye 2 (two) times daily as needed.  Marland Kitchen esomeprazole (NEXIUM) 40 MG capsule Take 40 mg by mouth daily.  . fluocinolone (SYNALAR) 0.01 % external solution Apply 1-2 application topically 2 (two) times daily as needed.   . furosemide (LASIX) 20 MG tablet Take 20 mg by mouth every other day.   . hydrocortisone cream 1 % Mix with mositurelle cream then apply small amount to affected areas as needed for rash  . iron polysaccharides (NIFEREX) 150 MG capsule Take 150 mg by mouth 2 (two) times daily. One capsule each morning   . losartan (COZAAR) 50 MG tablet Take 50 mg by mouth daily.  . Loteprednol Etabonate (LOTEMAX) 0.5 % GEL Apply 0.5 inches to eye 2 (two) times daily. Left eye  . metoprolol succinate (TOPROL-XL) 50 MG 24 hr tablet Take 50 mg by mouth daily. Take with or immediately following a meal.  . Multiple Vitamins-Minerals (PRESERVISION AREDS 2 PO) Take 1  capsule by mouth 2 (two) times daily.  . nitroGLYCERIN (NITROSTAT) 0.4 MG SL tablet Place 0.4 mg under the tongue every 5 (five) minutes as needed for chest pain.   Marland Kitchen sertraline (ZOLOFT) 50 MG tablet Take 75 mg by mouth daily.   . simethicone (MYLICON) 371 MG chewable tablet Chew 250 mg by mouth 2 (two) times daily.  . simvastatin (ZOCOR) 20 MG tablet Take 20 mg by mouth daily.  Marland Kitchen triamcinolone (KENALOG) 0.025 % cream Apply 1 application topically 2 (two) times daily.   No facility-administered encounter medications on file as of 05/05/2017.     Review of Systems  Constitutional: Positive for activity change. Negative for appetite change, chills, fatigue, fever and unexpected weight change.  HENT: Positive for hearing loss. Negative for congestion.   Eyes: Positive for visual disturbance.  Respiratory: Negative for  chest tightness and shortness of breath.   Cardiovascular: Negative for chest pain, palpitations and leg swelling.  Gastrointestinal: Positive for abdominal pain. Negative for abdominal distention, blood in stool, constipation and vomiting.  Genitourinary: Negative for dysuria.  Musculoskeletal: Positive for arthralgias. Negative for joint swelling.  Skin: Positive for pallor.  Neurological: Positive for weakness. Negative for dizziness.  Hematological: Bruises/bleeds easily.  Psychiatric/Behavioral: Negative for agitation and sleep disturbance.    Immunization History  Administered Date(s) Administered  . Influenza Inj Mdck Quad Pf 10/09/2016  . Influenza Whole 09/26/2013  . Influenza-Unspecified 09/26/2014, 10/04/2015  . Pneumococcal Conjugate-13 07/31/2016  . Pneumococcal Polysaccharide-23 12/22/2008  . Tdap 10/13/2012   Pertinent  Health Maintenance Due  Topic Date Due  . INFLUENZA VACCINE  07/22/2017  . PNA vac Low Risk Adult  Completed   Fall Risk  04/15/2017 06/20/2016 10/08/2015 09/28/2013  Falls in the past year? No No No No  Risk for fall due to : - - History  of fall(s) History of fall(s)  Risk for fall due to (comments): - - - 10/13 fell out of bed   Functional Status Survey:    Vitals:   05/05/17 1323  BP: (!) 148/74  Pulse: 72  Resp: 18  Temp: (!) 96.7 F (35.9 C)  TempSrc: Oral  SpO2: 97%   There is no height or weight on file to calculate BMI. Physical Exam  Constitutional: She is oriented to person, place, and time. She appears well-developed and well-nourished. No distress.  Eyes:  glasses  Cardiovascular: Normal rate, regular rhythm, normal heart sounds and intact distal pulses.   Pulmonary/Chest: Effort normal and breath sounds normal. She has no rales.  Abdominal: Soft.  Musculoskeletal:  Uses scooter to get around  Neurological: She is alert and oriented to person, place, and time.  Skin: Skin is warm and dry.  Psychiatric: She has a normal mood and affect. Her behavior is normal. Judgment and thought content normal.    Labs reviewed:  Recent Labs  05/21/16 12/02/16 04/30/17  NA 140 144 141  K 4.8 4.4 4.8  BUN 20 14 20   CREATININE 0.7 0.7 0.7    Recent Labs  05/21/16 12/02/16 04/30/17  AST 16 18 14   ALT 12 10 12   ALKPHOS 87 97 79    Recent Labs  09/23/16 0600 12/02/16 04/30/17  WBC 9.7 10.6 10.4  HGB 7.3* 9.1* 8.4*  HCT 22* 31* 26*  PLT 473* 492* 577*   Lab Results  Component Value Date   TSH 1.50 12/02/2016   Lab Results  Component Value Date   HGBA1C 5.7 09/23/2016   Lab Results  Component Value Date   CHOL 150 09/23/2016   HDL 54 09/23/2016   LDLCALC 65 09/23/2016   TRIG 151 09/23/2016   CHOLHDL 3.4 02/01/2014    Assessment/Plan 1. Sinus tachycardia by electrocardiogram -responded to single dose of lopressor and some valsalva manuevers .last night and now HR normal -EKG showed HR 147 last night and some ischemia in lateral leads -on call had recommended hospitalization, but resident opted to stay at [facility]  2. Cardiac ischemia In context of significant tachycardia--pending cardiac  enzymes -advised pt that I want her to at least see her cardiologist sooner than 6/1 if these enzymes are elevated--she does not want any aggressive interventions at 101 and also has her colon mass  3. Adenocarcinoma, colon ([place]) -of right colon, f/u CT in am as planned -monitor for new symptoms and concerns for obstruction -not a surgical  candidate at 101 - on iron for anemia  Family/ staff Communication: discussed with pt and SNF nurse  Labs/tests ordered:  Ck mb, troponin I

## 2017-05-06 ENCOUNTER — Ambulatory Visit
Admission: RE | Admit: 2017-05-06 | Discharge: 2017-05-06 | Disposition: A | Discharge status: Home / Self Care | Payer: Medicare Other | Source: Ambulatory Visit | Attending: Gastroenterology | Admitting: Gastroenterology

## 2017-05-06 DIAGNOSIS — C189 Malignant neoplasm of colon, unspecified: Secondary | ICD-10-CM

## 2017-05-06 DIAGNOSIS — R9389 Abnormal findings on diagnostic imaging of other specified body structures: Secondary | ICD-10-CM

## 2017-05-06 MED ORDER — IOPAMIDOL (ISOVUE-300) INJECTION 61%
100.0000 mL | Freq: Once | INTRAVENOUS | Status: AC | PRN
  Administered 2017-05-06: 100 mL via INTRAVENOUS

## 2017-05-21 DIAGNOSIS — H18422 Band keratopathy, left eye: Secondary | ICD-10-CM | POA: Diagnosis not present

## 2017-05-21 DIAGNOSIS — H353212 Exudative age-related macular degeneration, right eye, with inactive choroidal neovascularization: Secondary | ICD-10-CM | POA: Diagnosis not present

## 2017-05-21 DIAGNOSIS — Z961 Presence of intraocular lens: Secondary | ICD-10-CM | POA: Diagnosis not present

## 2017-05-22 ENCOUNTER — Encounter (INDEPENDENT_AMBULATORY_CARE_PROVIDER_SITE_OTHER): Payer: Self-pay

## 2017-05-22 ENCOUNTER — Ambulatory Visit (INDEPENDENT_AMBULATORY_CARE_PROVIDER_SITE_OTHER): Payer: Medicare Other | Admitting: Cardiology

## 2017-05-22 ENCOUNTER — Encounter: Payer: Self-pay | Admitting: Cardiology

## 2017-05-22 VITALS — BP 150/58 | HR 74 | Ht 60.0 in | Wt 143.0 lb

## 2017-05-22 DIAGNOSIS — I2510 Atherosclerotic heart disease of native coronary artery without angina pectoris: Secondary | ICD-10-CM

## 2017-05-22 DIAGNOSIS — I2583 Coronary atherosclerosis due to lipid rich plaque: Secondary | ICD-10-CM | POA: Diagnosis not present

## 2017-05-22 DIAGNOSIS — I10 Essential (primary) hypertension: Secondary | ICD-10-CM | POA: Diagnosis not present

## 2017-05-22 DIAGNOSIS — Z8673 Personal history of transient ischemic attack (TIA), and cerebral infarction without residual deficits: Secondary | ICD-10-CM

## 2017-05-22 DIAGNOSIS — I5032 Chronic diastolic (congestive) heart failure: Secondary | ICD-10-CM

## 2017-05-22 DIAGNOSIS — I259 Chronic ischemic heart disease, unspecified: Secondary | ICD-10-CM

## 2017-05-22 MED ORDER — METOPROLOL SUCCINATE ER 25 MG PO TB24: 25.0000 mg | ORAL_TABLET | Freq: Every day | ORAL | 3 refills | Status: DC

## 2017-05-22 MED ORDER — METOPROLOL SUCCINATE ER 50 MG PO TB24: 50.0000 mg | ORAL_TABLET | Freq: Every day | ORAL | 3 refills | Status: DC

## 2017-05-22 NOTE — Patient Instructions (Signed)
Medication Instructions:  Please increase Metoprolol to 75 mg a day (a 50 mg tablet and a 25 mg tablet daily) Continue all other medications as listed.  Follow-Up: Follow up in 6 months with Dr. Marlou Porch.  You will receive a letter in the mail 2 months before you are due.  Please call us when you receive this letter to schedule your follow up appointment.  If you need a refill on your cardiac medications before your next appointment, please call your pharmacy.  Thank you for choosing [facility]!!

## 2017-05-22 NOTE — Progress Notes (Signed)
[facility]. [address] [place], [place]  [address] Phone: [phone] Fax:  [phone]  Date:  05/22/2017   ID:  KATARA GRINER, DOB [birth year], MRN [id number]  PCP:  Gayland Curry, DO   History of Present Illness: Sonya [name] is a 83 y.o. female with coronary artery disease status post ST elevation myocardial infarction receiving an RCA stent with residual moderate LAD disease and obtuse marginal disease here for followup.  She had an episode of hemoglobin 6.5. She states that she was asymptomatic. Dr. Watt Climes saw her GI, colonic mass.   She gets around in her electric chair. Lives at [facility].   CAD-stable with no exertional anginal symptoms. She will occasionally have fleeting chest discomfort, this is usually relieved by burping. In the past her chest discomfort post catheterization/stent placement was relieved with Nexium. I will stop isosorbide. She can call if symptoms arise.   Stroke-unfortunately suffered a stroke in early 2015. Dr. Leonie Man. Dysarthria has improved slightly. Dysphagia has improved. Left-sided facial droop has improved. Aspirin has been stopped because of bleeding.   Hyperlipidemia-in the past, she has not tolerated statins due to leg weakness.  GERD-doing well with Nexium. Seen Dr. Lamonte Sakai for chronic aspiration. Occasionally will have wheezing.  Inez Catalina her friend here with her.   05/22/17-she was seen by Dr. Mariea Clonts at [facility] because of her heart fluttering. She has had a history of atrial flutter and some episodes of sinus tach. Her heart rate was 147. Her EKG was interpreted as sinus tach with ST depression in the lateral leads V2 through V6 with flattened T-wave in V5. She was given a dose of Lopressor 25 mg once and her heart rate slowed back to normal. At next morning she was feeling fine. She did not wish to go to the hospital. No chest pain during this episode. Sometimes she will have right lower quadrant pain where her colonic mass  is located.   Wt Readings from Last 3 Encounters:  05/22/17 143 lb (64.9 kg)  04/15/17 142 lb (64.4 kg)  04/02/17 142 lb (64.4 kg)     Past Medical History:  Diagnosis Date  . Acute bronchitis   . Acute myocardial infarction, unspecified site, episode of care unspecified 03/07/11  . Asthma   . Cardiomegaly   . CHF (congestive heart failure) ([place])   . Chronic diastolic heart failure ([place])   . Chronic diastolic heart failure ([place]) 01/31/2014   01/2014: 2d echo: EF 34-91%, grade 1 diastolic dysfunction   . Coronary atherosclerosis of native coronary artery     s/p stent diagonal branch 1996; RCA 03/07/11  . Debility, unspecified   . Depression   . Diaphragmatic hernia without mention of obstruction or gangrene   . Dysphagia S/P CVA (cerebrovascular accident) 02/14/2014  . Edema 2006  . Finger numbness 12/28/2013   Left thumb, 1, 2nd fingers  . Herpes zoster complication 7915   complicated, left eye involvement  . Hypertension   . Irritable bowel syndrome   . Macular degeneration (senile) of retina, unspecified 2012   wet  . Nontoxic uninodular goiter    negative [place] 2008  . Open wound of knee, leg (except thigh), and ankle, without mention of complication 04/6978   left leg, complicated wound, extended healing, resolved 01/2013  . Other abnormal blood chemistry   . Other specified genital prolapse(618.89)    corrected with pessary  . Shoulder pain 03/30/2013  . Stroke ([place])   .  TIA (transient ischemic attack)   . Transient ischemic attack (TIA), and cerebral infarction without residual deficits(V12.54) 2008    Past Surgical History:  Procedure Laterality Date  . APPENDECTOMY    . [place], 2001, 2012  . CAROTID STENT     diagonal branch of LCA  . CATARACT EXTRACTION    . CORONARY ANGIOPLASTY    . TONSILLECTOMY AND ADENOIDECTOMY      Current Outpatient Prescriptions  Medication Sig Dispense Refill  . acetaminophen (TYLENOL) 500 MG tablet Take 1,000 mg  by mouth every 8 (eight) hours as needed.    Marland Kitchen antiseptic oral rinse (BIOTENE) LIQD 15 mLs by Mouth Rinse route at bedtime.    . budesonide (ENTOCORT EC) 3 MG 24 hr capsule Take 3 mg by mouth daily.    . carboxymethylcellulose (REFRESH PLUS) 0.5 % SOLN Place 1 drop into the right eye 2 (two) times daily as needed.    Marland Kitchen esomeprazole (NEXIUM) 40 MG capsule Take 40 mg by mouth daily.    . fluocinolone (SYNALAR) 0.01 % external solution Apply 1-2 application topically 2 (two) times daily as needed.     . furosemide (LASIX) 20 MG tablet Take 20 mg by mouth every other day.     . hydrocortisone cream 1 % Mix with mositurelle cream then apply small amount to affected areas as needed for rash    . iron polysaccharides (NIFEREX) 150 MG capsule Take 150 mg by mouth 2 (two) times daily. One capsule each morning     . losartan (COZAAR) 50 MG tablet Take 50 mg by mouth daily.    . Loteprednol Etabonate (LOTEMAX) 0.5 % GEL Apply 0.5 inches to eye 2 (two) times daily. Left eye    . metoprolol succinate (TOPROL-XL) 50 MG 24 hr tablet Take 1 tablet (50 mg total) by mouth daily. Take with or immediately following a meal. 90 tablet 3  . Multiple Vitamins-Minerals (PRESERVISION AREDS 2 PO) Take 1 capsule by mouth 2 (two) times daily.    . nitroGLYCERIN (NITROSTAT) 0.4 MG SL tablet Place 0.4 mg under the tongue every 5 (five) minutes as needed for chest pain.     Marland Kitchen sertraline (ZOLOFT) 50 MG tablet Take 75 mg by mouth daily.     . simethicone (MYLICON) 338 MG chewable tablet Chew 250 mg by mouth 2 (two) times daily.    . simvastatin (ZOCOR) 20 MG tablet Take 20 mg by mouth daily.    Marland Kitchen triamcinolone (KENALOG) 0.025 % cream Apply 1 application topically 2 (two) times daily.    . metoprolol succinate (TOPROL-XL) 25 MG 24 hr tablet Take 1 tablet (25 mg total) by mouth daily. 90 tablet 3   No current facility-administered medications for this visit.     Allergies:    Allergies  Allergen Reactions  . Crestor  [Rosuvastatin] Other (See Comments)    Leg Weakness   . Ace Inhibitors Cough  . Plavix [Clopidogrel] Nausea And Vomiting    Social History:  The patient  reports that she quit smoking about 53 years ago. She has never used smokeless tobacco. She reports that she drinks about 3.0 oz of alcohol per week . She reports that she does not use drugs.   ROS:  Please see the history of present illness.    PHYSICAL EXAM: VS:  BP (!) 150/58   Pulse 74   Ht 5' (1.524 m)   Wt 143 lb (64.9 kg)   BMI 27.93 kg/m  Well nourished,  well developed, in no acute distress elderly, in motorized wheelchair. HEENT: one eye with corneal deposits Neck: no JVD  Cardiac:  normal S1, S2; RRR; no murmur  Lungs:  clear to auscultation bilaterally, no wheezing, rhonchi or rales  Abd: soft, nontender, no hepatomegaly  Ext: no edema  Skin: Right leg improved no edema.  Neuro: Very minimal left facial droop. Dysarthria noted.  EKG:  EKG was ordered today 09/01/16-sinus rhythm, 70, nonspecific ST-T wave changes. Personally viewed-05/01/15-sinus rhythm, 73, old inferior infarct pattern, nonspecific T-wave changes-prior Normal rhythm 63, T wave inversion inferior leads mostly but scattered throughout EKG.     ASSESSMENT AND PLAN:   Sinus tachycardia/possible atrial flutter  - She is not a candidate for anticoagulation  - Agree with the added metoprolol that evening at 25 mg.  - We will increase her Toprol from 50 to 75mg   - Currently feeling well.  Coronary artery disease post MI  - No longer on aspirin because of severe anemia. On simvastatin. Good lipids.  Essential hypertension  - No changes.  Prior stroke  - 01/2014, dysarthria, improved  History of severe anemia  - Hemoglobin 6 in the past. Colonic mass as possible source. To high risk for excision.  Adenocarcinoma of the colon  - This is increased in size, now 5 cm from 2 cm with lymphatic colonic spread.  - She is seeing Dr. Watt Climes soon.   Chronic  cough  - Has seen Dr. Lamonte Sakai. Possibly in part from chronic aspiration post CVA  6 month follow up.  Signed, Candee Furbish, MD [facility]  05/22/2017 4:23 PM

## 2017-06-05 NOTE — Progress Notes (Signed)
ERROR

## 2017-06-16 ENCOUNTER — Encounter: Payer: Self-pay | Admitting: Internal Medicine

## 2017-06-16 ENCOUNTER — Non-Acute Institutional Stay (SKILLED_NURSING_FACILITY): Payer: Medicare Other | Admitting: Internal Medicine

## 2017-06-16 DIAGNOSIS — H353290 Exudative age-related macular degeneration, unspecified eye, stage unspecified: Secondary | ICD-10-CM

## 2017-06-16 DIAGNOSIS — G3184 Mild cognitive impairment, so stated: Secondary | ICD-10-CM | POA: Diagnosis not present

## 2017-06-16 DIAGNOSIS — R002 Palpitations: Secondary | ICD-10-CM | POA: Diagnosis not present

## 2017-06-16 DIAGNOSIS — C189 Malignant neoplasm of colon, unspecified: Secondary | ICD-10-CM

## 2017-06-16 DIAGNOSIS — I10 Essential (primary) hypertension: Secondary | ICD-10-CM | POA: Diagnosis not present

## 2017-06-16 DIAGNOSIS — D500 Iron deficiency anemia secondary to blood loss (chronic): Secondary | ICD-10-CM | POA: Diagnosis not present

## 2017-06-16 NOTE — Progress Notes (Signed)
Patient ID: Sonya [name], female   DOB: 28-Mar-1913, 83 y.o.   MRN: [id number]  Location:  [facility] Room Number: 124 Place of Service:  SNF ([phone]) Provider:   Gayland Curry, DO  Patient Care Team: Sonya Curry, DO as PCP - General (Geriatric Medicine) [facility] Sonya [name], Sonya Farr, MD as Consulting Physician (Cardiology) Sonya Chance, NP as Nurse Practitioner (Obstetrics and Gynecology) Sonya Essex, MD as Consulting Physician (Gastroenterology)  Extended Emergency Contact Information Primary Emergency Contact: [name],Sonya Address: [address]          [place] [address] [name] of [place] Phone: [phone] Relation: Friend Secondary Emergency Contact: Sonya [name] Address: [address]          [place], [place] [address] [name] of [place] Phone: [phone] Work Phone: [phone] Mobile Phone: [phone] Relation: Friend  Code Status:  DNR Goals of care: Advanced Directive information Advanced Directives 06/16/2017  Does Patient Have a Medical Advance Directive? Yes  Type of Advance Directive Out of facility DNR (pink MOST or yellow form);Sonya [name];Living will  Does patient want to make changes to medical advance directive? -  Copy of [place] in Chart? Yes  Pre-existing out of facility DNR order (yellow form or pink MOST form) Yellow form placed in chart (order not valid for inpatient use)    Chief Complaint  Patient presents with  . Medical Management of Chronic Issues    Routine Visit    HPI:  Pt is a 83 y.o. female seen today for medical management of chronic diseases.    Denies recent palpitations.  Dr. Marlou [name] increased her toprol to 75mg  po daily due to these.  Nursing notes indicate that she had them on 3 occasions, but HR was normal when it was checked.    5/17 CT abdomen showed an increase in the size of her colon mass.  She reports  that,on occasion, she will feel that it's there, but the pain does not last long.  She is on budesonide daily from Dr. Watt [name].  Weight is pretty stable at 143--has bene in the 140s this year ranging from 142-147lbs.  She mentions a different pain that is more epigastric that sometimes lasts for a quite a while, but she does not have it at the time of the appt.  She says it gets better when she eats.  She is on nexium.    Her last hgb in May had trended down to 8.4.  She is on iron polysaccharides 150mg  po bid.    She also saw Dr. Cordelia [name] who recommended she take icaps or preservision areds 2 vitamins for her macular degeneration.  She was having more difficulty seeing her silverware.  She reports to me that in general, her vision is worse and there is nothing that can be done.  She also says she is just too old.    Her memory is not as good, but not to a point where her cognition affects her daily functioning.  She lives in skilled care long term due to a stroke she had a few years ago.    BP is at goal with cozaar, toprol-xl, lasix diuretic.  No dizziness reported.  Past Medical History:  Diagnosis Date  . Acute bronchitis   . Acute myocardial infarction, unspecified site, episode of care unspecified 03/07/11  . Asthma   . Cardiomegaly   . CHF (congestive heart failure) ([place])   . Chronic diastolic  heart failure ([place])   . Chronic diastolic heart failure ([place]) 01/31/2014   01/2014: 2d echo: EF 84-13%, grade 1 diastolic dysfunction   . Coronary atherosclerosis of native coronary artery     s/p stent diagonal branch 1996; RCA 03/07/11  . Debility, unspecified   . Depression   . Diaphragmatic hernia without mention of obstruction or gangrene   . Dysphagia S/P CVA (cerebrovascular accident) 02/14/2014  . Edema 2006  . Finger numbness 12/28/2013   Left thumb, 1, 2nd fingers  . Herpes zoster complication 2440   complicated, left eye involvement  . Hypertension   . Irritable bowel syndrome   .  Macular degeneration (senile) of retina, unspecified 2012   wet  . Nontoxic uninodular goiter    negative [place] 2008  . Open wound of knee, leg (except thigh), and ankle, without mention of complication 09/2724   left leg, complicated wound, extended healing, resolved 01/2013  . Other abnormal blood chemistry   . Other specified genital prolapse(618.89)    corrected with pessary  . Shoulder pain 03/30/2013  . Stroke ([place])   . TIA (transient ischemic attack)   . Transient ischemic attack (TIA), and cerebral infarction without residual deficits(V12.54) 2008   Past Surgical History:  Procedure Laterality Date  . APPENDECTOMY    . [place], 2001, 2012  . CAROTID STENT     diagonal branch of LCA  . CATARACT EXTRACTION    . CORONARY ANGIOPLASTY    . TONSILLECTOMY AND ADENOIDECTOMY      Allergies  Allergen Reactions  . Crestor [Rosuvastatin] Other (See Comments)    Leg Weakness   . Ace Inhibitors Cough  . Plavix [Clopidogrel] Nausea And Vomiting    Outpatient Encounter Prescriptions as of 06/16/2017  Medication Sig  . acetaminophen (TYLENOL) 500 MG tablet Take 1,000 mg by mouth every 8 (eight) hours as needed.  Marland Kitchen antiseptic oral rinse (BIOTENE) LIQD 15 mLs by Mouth Rinse route at bedtime.  . budesonide (ENTOCORT EC) 3 MG 24 hr capsule Take 3 mg by mouth daily.  . carboxymethylcellulose (REFRESH PLUS) 0.5 % SOLN Place 1 drop into the right eye 2 (two) times daily as needed.  Marland Kitchen esomeprazole (NEXIUM) 40 MG capsule Take 40 mg by mouth daily.  . fluocinolone (SYNALAR) 0.01 % external solution Apply 1-2 application topically 2 (two) times daily as needed.   . furosemide (LASIX) 20 MG tablet Take 20 mg by mouth every other day.   . hydrocortisone cream 1 % Mix with mositurelle cream then apply small amount to affected areas as needed for rash  . iron polysaccharides (NIFEREX) 150 MG capsule Take 150 mg by mouth 2 (two) times daily. One capsule each morning   . losartan  (COZAAR) 50 MG tablet Take 50 mg by mouth daily.  . Loteprednol Etabonate (LOTEMAX) 0.5 % GEL Apply 0.5 inches to eye 2 (two) times daily. Left eye  . metoprolol succinate (TOPROL-XL) 50 MG 24 hr tablet Take 75 mg by mouth daily. Take with or immediately following a meal.  . Multiple Vitamins-Minerals (PRESERVISION AREDS 2 PO) Take 1 capsule by mouth 2 (two) times daily.  . nitroGLYCERIN (NITROSTAT) 0.4 MG SL tablet Place 0.4 mg under the tongue every 5 (five) minutes as needed for chest pain.   Marland Kitchen sertraline (ZOLOFT) 50 MG tablet Take 75 mg by mouth daily.   . simethicone (MYLICON) 366 MG chewable tablet Chew 250 mg by mouth 2 (two) times daily.  . simvastatin (ZOCOR) 20 MG  tablet Take 20 mg by mouth daily.  Marland Kitchen triamcinolone (KENALOG) 0.025 % cream Apply 1 application topically 2 (two) times daily.  . [DISCONTINUED] metoprolol succinate (TOPROL-XL) 25 MG 24 hr tablet Take 1 tablet (25 mg total) by mouth daily.  . [DISCONTINUED] metoprolol succinate (TOPROL-XL) 50 MG 24 hr tablet Take 1 tablet (50 mg total) by mouth daily. Take with or immediately following a meal.   No facility-administered encounter medications on file as of 06/16/2017.     Review of Systems  Constitutional: Positive for fatigue. Negative for activity change, appetite change, chills, fever and unexpected weight change.  HENT: Positive for hearing loss and trouble swallowing. Negative for congestion.   Eyes: Positive for visual disturbance.       Poor vision from macular degeneration  Respiratory: Negative for chest tightness and shortness of breath.   Cardiovascular: Negative for chest pain, palpitations and leg swelling.  Gastrointestinal: Positive for abdominal pain. Negative for abdominal distention, blood in stool, constipation, diarrhea, nausea and vomiting.  Genitourinary: Negative for dysuria.  Musculoskeletal: Negative for arthralgias and joint swelling.       Still tender on right anterior shin--chronic    Neurological: Positive for weakness. Negative for dizziness.  Hematological: Does not bruise/bleed easily.  Psychiatric/Behavioral: Negative for behavioral problems, confusion and sleep disturbance. The patient is not nervous/anxious.        Short term memory loss increasingly notable    Immunization History  Administered Date(s) Administered  . Influenza Inj Mdck Quad Pf 10/09/2016  . Influenza Whole 09/26/2013  . Influenza-Unspecified 09/26/2014, 10/04/2015  . Pneumococcal Conjugate-13 07/31/2016  . Pneumococcal Polysaccharide-23 12/22/2008  . Tdap 10/13/2012   Pertinent  Health Maintenance Due  Topic Date Due  . INFLUENZA VACCINE  07/22/2017  . PNA vac Low Risk Adult  Completed   Fall Risk  04/15/2017 06/20/2016 10/08/2015 09/28/2013  Falls in the past year? No No No No  Risk for fall due to : - - History of fall(s) History of fall(s)  Risk for fall due to (comments): - - - 10/13 fell out of bed   Functional Status Survey:    Vitals:   06/16/17 1435  BP: (!) 152/68  Pulse: 77  Resp: 18  Temp: 97.6 F (36.4 C)  TempSrc: Oral  SpO2: 95%  Weight: 143 lb (64.9 kg)   Body mass index is 27.93 kg/m. Physical Exam  Constitutional: She is oriented to person, place, and time. She appears well-developed and well-nourished. No distress.  Eyes:  Glasses, poor vision  Cardiovascular: Normal rate, regular rhythm, normal heart sounds and intact distal pulses.   Pulmonary/Chest: Effort normal and breath sounds normal. No respiratory distress.  Abdominal: Soft. Bowel sounds are normal. She exhibits no distension. There is no tenderness. There is no rebound and no guarding.  Musculoskeletal:  Uses power chair to get around   Neurological: She is alert and oriented to person, place, and time.  But forgetting some when giving history--has stories mixed up a bit  Skin: Skin is warm and dry. There is pallor.  Psychiatric: She has a normal mood and affect.    Labs  reviewed:  Recent Labs  12/02/16 04/30/17  NA 144 141  K 4.4 4.8  BUN 14 20  CREATININE 0.7 0.7    Recent Labs  12/02/16 04/30/17  AST 18 14  ALT 10 12  ALKPHOS 97 79    Recent Labs  09/23/16 0600 12/02/16 04/30/17  WBC 9.7 10.6 10.4  HGB 7.3* 9.1* 8.4*  HCT 22* 31* 26*  PLT 473* 492* 577*   Lab Results  Component Value Date   TSH 1.50 12/02/2016   Lab Results  Component Value Date   HGBA1C 5.7 09/23/2016   Lab Results  Component Value Date   CHOL 150 09/23/2016   HDL 54 09/23/2016   LDLCALC 65 09/23/2016   TRIG 151 09/23/2016   CHOLHDL 3.4 02/01/2014    Significant Diagnostic Results in last 30 days:  Repeat CT abd/pelvis reviewed.  Assessment/Plan 1. Adenocarcinoma, colon ([place]) -mass noted to be growing in May and she does get some pain occasionally in the area, but it's not persistent -cont budesonide per GI  -wt stable -no other new symptoms -monitoring and txing palliatively  2. Exudative age-related macular degeneration, unspecified laterality, unspecified stage ([place]) -ongoing, cont AREDS-2 and monitor, nothing can be done otherwise  3. Heart palpitations -better with increased toprol by her report, cont to monitor  4. Mild cognitive impairment with memory loss -noted, is gradually declining, but remains able to have good conversations, history not always precise  5. Anemia due to chronic blood loss -ongoing, on bid iron supplement--cont this and monitor  6. Essential hypertension -bp satisfactory with current regimen, today's slightly high but was with automatic cuff--staff need to recheck manually  Family/ staff Communication: discussed with snf nurse  Labs/tests ordered:  No new  Rasheedah Reis L. Bruk [place], D.O. [facility] [address],  [address] Cell Phone (Mon-Fri 8am-5pm):  [phone] On Call:  [phone] & follow prompts after 5pm & weekends Office Phone:   [phone] Office Fax:  [phone]

## 2017-06-29 DIAGNOSIS — R933 Abnormal findings on diagnostic imaging of other parts of digestive tract: Secondary | ICD-10-CM | POA: Diagnosis not present

## 2017-06-30 ENCOUNTER — Encounter: Payer: Self-pay | Admitting: Internal Medicine

## 2017-06-30 ENCOUNTER — Non-Acute Institutional Stay (SKILLED_NURSING_FACILITY): Payer: Medicare Other | Admitting: Internal Medicine

## 2017-06-30 DIAGNOSIS — R112 Nausea with vomiting, unspecified: Secondary | ICD-10-CM

## 2017-06-30 DIAGNOSIS — H353290 Exudative age-related macular degeneration, unspecified eye, stage unspecified: Secondary | ICD-10-CM | POA: Diagnosis not present

## 2017-06-30 DIAGNOSIS — Z7189 Other specified counseling: Secondary | ICD-10-CM | POA: Diagnosis not present

## 2017-06-30 DIAGNOSIS — C189 Malignant neoplasm of colon, unspecified: Secondary | ICD-10-CM | POA: Diagnosis not present

## 2017-06-30 DIAGNOSIS — R1312 Dysphagia, oropharyngeal phase: Secondary | ICD-10-CM | POA: Diagnosis not present

## 2017-06-30 NOTE — Progress Notes (Signed)
Patient ID: Sonya [name], female   DOB: 1913-10-21, 83 y.o.   MRN: [id number]  Location:  [facility] Room Number: 124 Place of Service:  SNF [phone]) Provider:   Gayland Curry, DO  Patient Care Team: Gayland Curry, DO as PCP - General (Geriatric Medicine) Community, [facility] [place] [place], Thana Farr, MD as Consulting Physician (Cardiology) [name], NP as Nurse Practitioner (Obstetrics and Gynecology) Clarene Essex, MD as Consulting Physician (Gastroenterology)  Extended Emergency Contact Information Primary Emergency Contact: Sonya [name],Sonya [name] Address: [address]           [address] [name] of [place] Phone: [phone] Relation: Friend Secondary Emergency Contact: [name],Sonya Address: [address]          [place], [place] [address] [name] of [place] Phone: [phone] Work Phone: [phone] Mobile Phone: [phone] Relation: Friend  Code Status:  DNR Goals of care: Advanced Directive information Advanced Directives 06/30/2017  Does Patient Have a Medical Advance Directive? Yes  Type of Advance Directive Out of facility DNR (pink MOST or yellow form);[place];Living will  Does patient want to make changes to medical advance directive? -  Copy of [place] in Chart? Yes  Pre-existing out of facility DNR order (yellow form or pink MOST form) Yellow form placed in chart (order not valid for inpatient use)   Chief Complaint  Patient presents with  . Acute Visit    abdominal pain    HPI:  Pt is a 83 y.o. female with a right colon mass felt to be adenocarcinoma, severe macular degeneration, prior stroke with dysphagia and dysarthria,  CAD, htn, anemia from chronic blood loss seen today for an acute visit for increased abdominal pain, tachycardia, htn, nausea and poor po intake.    She saw Sonya [name] yesterday and learned that her colon mass had grown on the repeat CT  scan she had.  She has been having some increased pain at the site and also in her epigastric area recently.  Sonya [name] had prescribed for her zofran and tramadol 42m q8-12 hrs as needed for pain.  She has been having more pain at the site and nausea, vomiting.  She did not take her regular medications yesterday or today or eat meals.  She did eat some oranges this am.  She stayed in bed.  She did have a medium BM yesterday and has been going about every 2 days.  Staff note some increased gurgling in her throat and congestion.  She did miss her rate control medication.  Pt seems to think she has not had the tramadol or zofran b/c [facility] but apparently these have been obtained from our facility pharmacy.    When I met with Sonya [name] she said that she thinks that she has lived a good life and that her time is near.  We talked about involving hospice care to help manage her symptoms (currently of pain and nausea) and to support her many friends.  She was adamant that her pain would be better when she got Dr. MPerley Jainmedicines and would not accept that she'd already gotten emergency dispenses of these here.  She was very much agreeable to a hospice referral b/c she is clear that she is not a good surgical candidate at 83yo and also does not want to deal with the fatigue and potential side effects from radiation treatments.  She would like her symptoms  managed palliatively and does not want [place] to prolong her life.  I also spoke with her POA and friend, Sonya [name], who agreed with the hospice referral and overall plan.  We also reviewed Sonya [name]'s med list to eliminate medications not consistent with her palliative goals.    We also discussed possible aspiration during her coughing.  She has known dysphagia and has vomited.  Sonya [name] agreed with watching her rather than pursuing more testing at this point.  Sonya [name] also c/o her progressive visual loss from her wet macular degeneration.   Unfortunately, nothing more can be done to help with this.    Past Medical History:  Diagnosis Date  . Acute bronchitis   . Acute myocardial infarction, unspecified site, episode of care unspecified 03/07/11  . Asthma   . Cardiomegaly   . CHF (congestive heart failure) ([place])   . Chronic diastolic heart failure ([place])   . Chronic diastolic heart failure ([place]) 01/31/2014   01/2014: 2d echo: EF 88-50%, grade 1 diastolic dysfunction   . Coronary atherosclerosis of native coronary artery     s/p stent diagonal branch 1996; RCA 03/07/11  . Debility, unspecified   . Depression   . Diaphragmatic hernia without mention of obstruction or gangrene   . Dysphagia S/P CVA (cerebrovascular accident) 02/14/2014  . Edema 2006  . Finger numbness 12/28/2013   Left thumb, 1, 2nd fingers  . Herpes zoster complication 2774   complicated, left eye involvement  . Hypertension   . Irritable bowel syndrome   . Macular degeneration (senile) of retina, unspecified 2012   wet  . Nontoxic uninodular goiter    negative [place] 2008  . Open wound of knee, leg (except thigh), and ankle, without mention of complication 11/8785   left leg, complicated wound, extended healing, resolved 01/2013  . Other abnormal blood chemistry   . Other specified genital prolapse(618.89)    corrected with pessary  . Shoulder pain 03/30/2013  . Stroke ([place])   . TIA (transient ischemic attack)   . Transient ischemic attack (TIA), and cerebral infarction without residual deficits(V12.54) 2008   Past Surgical History:  Procedure Laterality Date  . APPENDECTOMY    . [place], 2001, 2012  . CAROTID STENT     diagonal branch of LCA  . CATARACT EXTRACTION    . CORONARY ANGIOPLASTY    . TONSILLECTOMY AND ADENOIDECTOMY      Allergies  Allergen Reactions  . Crestor [Rosuvastatin] Other (See Comments)    Leg Weakness   . Ace Inhibitors Cough  . Plavix [Clopidogrel] Nausea And Vomiting    Outpatient Encounter  Prescriptions as of 06/30/2017  Medication Sig  . acetaminophen (TYLENOL) 500 MG tablet Take 1,000 mg by mouth every 8 (eight) hours as needed.  Marland Kitchen antiseptic oral rinse (BIOTENE) LIQD 15 mLs by Mouth Rinse route at bedtime.  . budesonide (ENTOCORT EC) 3 MG 24 hr capsule Take 3 mg by mouth daily.  . carboxymethylcellulose (REFRESH PLUS) 0.5 % SOLN Place 1 drop into the right eye 2 (two) times daily as needed.  Marland Kitchen esomeprazole (NEXIUM) 40 MG capsule Take 40 mg by mouth daily.  . fluocinolone (SYNALAR) 0.01 % external solution Apply 1-2 application topically 2 (two) times daily as needed.   . furosemide (LASIX) 20 MG tablet Take 20 mg by mouth every other day.   . hydrocortisone cream 1 % Mix with mositurelle cream then apply small amount to affected areas as needed for rash  . iron  polysaccharides (NIFEREX) 150 MG capsule Take 150 mg by mouth 2 (two) times daily. One capsule each morning   . losartan (COZAAR) 50 MG tablet Take 50 mg by mouth daily.  . Loteprednol Etabonate (LOTEMAX) 0.5 % GEL Apply 0.5 inches to eye 2 (two) times daily. Left eye  . metoprolol succinate (TOPROL-XL) 50 MG 24 hr tablet Take 75 mg by mouth daily. Take with or immediately following a meal.  . Multiple Vitamins-Minerals (PRESERVISION AREDS 2 PO) Take 1 capsule by mouth 2 (two) times daily.  . nitroGLYCERIN (NITROSTAT) 0.4 MG SL tablet Place 0.4 mg under the tongue every 5 (five) minutes as needed for chest pain.   Marland Kitchen ondansetron (ZOFRAN) 4 MG tablet Take 4 mg by mouth every 8 (eight) hours as needed for nausea or vomiting.  . sertraline (ZOLOFT) 50 MG tablet Take 75 mg by mouth daily.   . simethicone (MYLICON) 761 MG chewable tablet Chew 250 mg by mouth 2 (two) times daily.  . simvastatin (ZOCOR) 20 MG tablet Take 20 mg by mouth daily.  . traMADol (ULTRAM) 50 MG tablet Take 50 mg by mouth every 8 (eight) hours as needed.  . triamcinolone (KENALOG) 0.025 % cream Apply 1 application topically 2 (two) times daily.   No  facility-administered encounter medications on file as of 06/30/2017.     Review of Systems  Constitutional: Positive for activity change, appetite change, fatigue and unexpected weight change. Negative for chills and fever.  HENT: Positive for congestion and hearing loss.   Eyes: Negative for visual disturbance.       Blurred vision, glasses  Respiratory: Positive for cough and choking. Negative for shortness of breath and wheezing.   Cardiovascular: Negative for chest pain and leg swelling.  Gastrointestinal: Positive for abdominal pain, nausea and vomiting. Negative for abdominal distention, anal bleeding, blood in stool, constipation, diarrhea and rectal pain.  Genitourinary: Negative for dysuria.  Musculoskeletal: Positive for gait problem. Negative for arthralgias.  Neurological: Positive for weakness. Negative for dizziness.  Hematological: Bruises/bleeds easily.  Psychiatric/Behavioral: Positive for confusion. Negative for agitation, behavioral problems and sleep disturbance. The patient is not nervous/anxious.        Very clear for 101, but does have some short term memory loss about details medically    Immunization History  Administered Date(s) Administered  . Influenza Inj Mdck Quad Pf 10/09/2016  . Influenza Whole 09/26/2013  . Influenza-Unspecified 09/26/2014, 10/04/2015  . Pneumococcal Conjugate-13 07/31/2016  . Pneumococcal Polysaccharide-23 12/22/2008  . Tdap 10/13/2012   Pertinent  Health Maintenance Due  Topic Date Due  . INFLUENZA VACCINE  07/22/2017  . PNA vac Low Risk Adult  Completed   Fall Risk  04/15/2017 06/20/2016 10/08/2015 09/28/2013  Falls in the past year? No No No No  Risk for fall due to : - - History of fall(s) History of fall(s)  Risk for fall due to (comments): - - - 10/13 fell out of bed   Functional Status Survey:  dependent except feeding, hoyer lift  Vitals:   06/30/17 1431  BP: (!) 174/74  Pulse: (!) 105  Resp: (!) 23  Temp: 98 F  (36.7 C)  TempSrc: Oral  SpO2: 93%  Weight: 141 lb (64 kg)   Body mass index is 27.54 kg/m. Physical Exam  Constitutional: She is oriented to person, place, and time. She appears well-nourished. No distress.  HENT:  Head: Normocephalic and atraumatic.  Eyes: Pupils are equal, round, and reactive to light. Conjunctivae are normal.  glasses  Cardiovascular: Intact distal pulses.   Tachy, regular  Pulmonary/Chest: Effort normal.  Some upper airway rhonchi  Abdominal: Soft. Bowel sounds are normal. She exhibits no distension. There is tenderness. There is no rebound and no guarding.  Tender over right side of abdomen and epigastric region  Musculoskeletal:  Resting in bed; normally up in power chair  Neurological: She is alert and oriented to person, place, and time.  Skin: Skin is warm and dry. There is pallor.    Labs reviewed:  Recent Labs  12/02/16 04/30/17  NA 144 141  K 4.4 4.8  BUN 14 20  CREATININE 0.7 0.7    Recent Labs  12/02/16 04/30/17  AST 18 14  ALT 10 12  ALKPHOS 97 79    Recent Labs  09/23/16 0600 12/02/16 04/30/17  WBC 9.7 10.6 10.4  HGB 7.3* 9.1* 8.4*  HCT 22* 31* 26*  PLT 473* 492* 577*   Lab Results  Component Value Date   TSH 1.50 12/02/2016   Lab Results  Component Value Date   HGBA1C 5.7 09/23/2016   Lab Results  Component Value Date   CHOL 150 09/23/2016   HDL 54 09/23/2016   LDLCALC 65 09/23/2016   TRIG 151 09/23/2016   CHOLHDL 3.4 02/01/2014    Significant Diagnostic Results in last 30 days:  Reviewed CT and notes from Sonya [name] and Dr. Marlou Porch   Assessment/Plan 1. Adenocarcinoma, colon ([place]) -is progressing with more abdominal pain, nausea, vomiting -does not have overt obstructive signs on examination with normal bowel sounds qod bms -cont bowel regimen and tx symptoms palliatively--cont tramadol, zofran and change tylenol to scheduled for pain -referral placed to Hospice and [facility] with consent  of pt and her HCPOA, Sonya [name]  2. Exudative age-related macular degeneration, unspecified laterality, unspecified stage ([place]) -progressive also, pt's vision is quite poor and she needs some assistance to get her plate set up for meals so she knows where food items are, she's not able to read   3. Nausea and vomiting, intractability of vomiting not specified, unspecified vomiting type -new onset, may have precipitated aspiration -tx symptom with zofran and monitor for any obstructive signs  4. Oropharyngeal dysphagia -longstanding since stroke, cont current diet and aspiration precautions with elevated HOB -suspect she may have had an aspiration event also but we have opted to monitor her rather than pursuing a CXR at this time  5. Advanced care planning/counseling discussion -discussed goals of care with patient and her HCPOA for approximately 30 minutes today -pt quite clear that she does not want surgery at 89 and does not want the potential side effects of radiation, wants comfort measures and agrees to hospice referral which was placed -simvastatin and nu-iron discontinued -avoid CXR and labs at this point and monitor in terms of potential aspiration--try to prevent recurrence with conservative measures  Family/ staff Communication: as above, d/w pt, HCPOA, SNF nursing, nurse manager, DON and ED  Labs/tests ordered:  Hospice referral  25 mins were spent on med mgt while 30 mins were spent on advance care planning.  Sihaam Chrobak L. Senna Lape, D.O. [facility] [address], [place] [address] Cell Phone (Mon-Fri 8am-5pm):  [phone] On Call:  [phone] & follow prompts after 5pm & weekends Office Phone:  [phone] Office Fax:  [phone]

## 2017-07-02 DIAGNOSIS — C772 Secondary and unspecified malignant neoplasm of intra-abdominal lymph nodes: Secondary | ICD-10-CM | POA: Diagnosis not present

## 2017-07-02 DIAGNOSIS — H3530 Unspecified macular degeneration: Secondary | ICD-10-CM | POA: Diagnosis not present

## 2017-07-02 DIAGNOSIS — I10 Essential (primary) hypertension: Secondary | ICD-10-CM | POA: Diagnosis not present

## 2017-07-02 DIAGNOSIS — G3184 Mild cognitive impairment, so stated: Secondary | ICD-10-CM | POA: Diagnosis not present

## 2017-07-02 DIAGNOSIS — H11149 Conjunctival xerosis, unspecified, unspecified eye: Secondary | ICD-10-CM | POA: Diagnosis not present

## 2017-07-02 DIAGNOSIS — K219 Gastro-esophageal reflux disease without esophagitis: Secondary | ICD-10-CM | POA: Diagnosis not present

## 2017-07-02 DIAGNOSIS — I4892 Unspecified atrial flutter: Secondary | ICD-10-CM | POA: Diagnosis not present

## 2017-07-02 DIAGNOSIS — F339 Major depressive disorder, recurrent, unspecified: Secondary | ICD-10-CM | POA: Diagnosis not present

## 2017-07-02 DIAGNOSIS — I25810 Atherosclerosis of coronary artery bypass graft(s) without angina pectoris: Secondary | ICD-10-CM | POA: Diagnosis not present

## 2017-07-02 DIAGNOSIS — L209 Atopic dermatitis, unspecified: Secondary | ICD-10-CM | POA: Diagnosis not present

## 2017-07-02 DIAGNOSIS — I5030 Unspecified diastolic (congestive) heart failure: Secondary | ICD-10-CM | POA: Diagnosis not present

## 2017-07-02 DIAGNOSIS — L8990 Pressure ulcer of unspecified site, unspecified stage: Secondary | ICD-10-CM | POA: Diagnosis not present

## 2017-07-02 DIAGNOSIS — R1310 Dysphagia, unspecified: Secondary | ICD-10-CM | POA: Diagnosis not present

## 2017-07-02 DIAGNOSIS — I679 Cerebrovascular disease, unspecified: Secondary | ICD-10-CM | POA: Diagnosis not present

## 2017-07-02 DIAGNOSIS — E785 Hyperlipidemia, unspecified: Secondary | ICD-10-CM | POA: Diagnosis not present

## 2017-07-02 DIAGNOSIS — R54 Age-related physical debility: Secondary | ICD-10-CM | POA: Diagnosis not present

## 2017-07-02 DIAGNOSIS — C189 Malignant neoplasm of colon, unspecified: Secondary | ICD-10-CM | POA: Diagnosis not present

## 2017-07-03 DIAGNOSIS — C772 Secondary and unspecified malignant neoplasm of intra-abdominal lymph nodes: Secondary | ICD-10-CM | POA: Diagnosis not present

## 2017-07-03 DIAGNOSIS — I5030 Unspecified diastolic (congestive) heart failure: Secondary | ICD-10-CM | POA: Diagnosis not present

## 2017-07-03 DIAGNOSIS — I25810 Atherosclerosis of coronary artery bypass graft(s) without angina pectoris: Secondary | ICD-10-CM | POA: Diagnosis not present

## 2017-07-03 DIAGNOSIS — G3184 Mild cognitive impairment, so stated: Secondary | ICD-10-CM | POA: Diagnosis not present

## 2017-07-03 DIAGNOSIS — C189 Malignant neoplasm of colon, unspecified: Secondary | ICD-10-CM | POA: Diagnosis not present

## 2017-07-03 DIAGNOSIS — I679 Cerebrovascular disease, unspecified: Secondary | ICD-10-CM | POA: Diagnosis not present

## 2017-07-04 DIAGNOSIS — I679 Cerebrovascular disease, unspecified: Secondary | ICD-10-CM | POA: Diagnosis not present

## 2017-07-04 DIAGNOSIS — C772 Secondary and unspecified malignant neoplasm of intra-abdominal lymph nodes: Secondary | ICD-10-CM | POA: Diagnosis not present

## 2017-07-04 DIAGNOSIS — G3184 Mild cognitive impairment, so stated: Secondary | ICD-10-CM | POA: Diagnosis not present

## 2017-07-04 DIAGNOSIS — I5030 Unspecified diastolic (congestive) heart failure: Secondary | ICD-10-CM | POA: Diagnosis not present

## 2017-07-04 DIAGNOSIS — I25810 Atherosclerosis of coronary artery bypass graft(s) without angina pectoris: Secondary | ICD-10-CM | POA: Diagnosis not present

## 2017-07-04 DIAGNOSIS — C189 Malignant neoplasm of colon, unspecified: Secondary | ICD-10-CM | POA: Diagnosis not present

## 2017-07-07 DIAGNOSIS — I679 Cerebrovascular disease, unspecified: Secondary | ICD-10-CM | POA: Diagnosis not present

## 2017-07-07 DIAGNOSIS — G3184 Mild cognitive impairment, so stated: Secondary | ICD-10-CM | POA: Diagnosis not present

## 2017-07-07 DIAGNOSIS — C772 Secondary and unspecified malignant neoplasm of intra-abdominal lymph nodes: Secondary | ICD-10-CM | POA: Diagnosis not present

## 2017-07-07 DIAGNOSIS — I25810 Atherosclerosis of coronary artery bypass graft(s) without angina pectoris: Secondary | ICD-10-CM | POA: Diagnosis not present

## 2017-07-07 DIAGNOSIS — C189 Malignant neoplasm of colon, unspecified: Secondary | ICD-10-CM | POA: Diagnosis not present

## 2017-07-07 DIAGNOSIS — I5030 Unspecified diastolic (congestive) heart failure: Secondary | ICD-10-CM | POA: Diagnosis not present

## 2017-07-09 ENCOUNTER — Non-Acute Institutional Stay (SKILLED_NURSING_FACILITY): Payer: Medicare Other | Admitting: Adult Health

## 2017-07-09 ENCOUNTER — Encounter: Payer: Self-pay | Admitting: Adult Health

## 2017-07-09 DIAGNOSIS — D500 Iron deficiency anemia secondary to blood loss (chronic): Secondary | ICD-10-CM | POA: Diagnosis not present

## 2017-07-09 DIAGNOSIS — F324 Major depressive disorder, single episode, in partial remission: Secondary | ICD-10-CM

## 2017-07-09 DIAGNOSIS — I10 Essential (primary) hypertension: Secondary | ICD-10-CM | POA: Diagnosis not present

## 2017-07-09 DIAGNOSIS — C189 Malignant neoplasm of colon, unspecified: Secondary | ICD-10-CM

## 2017-07-09 DIAGNOSIS — I5032 Chronic diastolic (congestive) heart failure: Secondary | ICD-10-CM | POA: Diagnosis not present

## 2017-07-09 NOTE — Progress Notes (Signed)
Location:  [facility] of Service:  SNF (31) Provider:   Cindi Carbon, ANP [place] [phone]   Gayland Curry, DO  Patient Care Team: Gayland Curry, DO as PCP - General (Geriatric Medicine) Community, [facility] [place], Thana Farr, MD as Consulting Physician (Cardiology) [name], NP as Nurse Practitioner (Obstetrics and Gynecology) Clarene Essex, MD as Consulting Physician (Gastroenterology)  Extended Emergency Contact Information Primary Emergency Contact: Lowe,Susan Address: [address]          [place] [address] [name] of [place] Phone: [phone] Relation: Friend Secondary Emergency Contact: Jeanella Craze Address: [address]          [place], [place] [address] [name] of [place] Phone: [phone] Work Phone: [phone] Mobile Phone: [phone] Relation: Friend  Code Status:  DNR Goals of care: Advanced Directive information Advanced Directives 06/30/2017  Does Patient Have a Medical Advance Directive? Yes  Type of Advance Directive Out of facility DNR (pink MOST or yellow form);[place];Living will  Does patient want to make changes to medical advance directive? -  Copy of [place] in Chart? Yes  Pre-existing out of facility DNR order (yellow form or pink MOST form) Yellow form placed in chart (order not valid for inpatient use)     Chief Complaint  Patient presents with  . Medical Management of Chronic Issues    HPI:  Pt is a 83 y.o. female seen today for medical management of chronic diseases.  Resides in skilled care.  She has a hx of a colon mass was found on CT of the abd 06/12/16 on the right ascending colon. She is followed by Dr. Watt Climes and also on budesonide. Recently she has become more symptomatic with RLQ pain and nausea. She does not like to have pain medication and is only using ultram when the pain is severe.  She  denies any pain for my visit or nausea.  Currently she is followed by hospice. Overall poor intake and declining status. She has not been out of bed in days.  Her goals of care are comfort based so she has chosen to forego any treatment for the mass. She declined scheduled tylenol. She states she only wants orders from Dr. Perley Jain office.  CHF: no edema, weight declining, no sob or cp. Lasix decreased to twice weekly due to poor intake and refusal Matrix indicates a RR of 36 on 7/18 but for my visit her RR was in the 20's and she denied any distress.   Anemia: due to chronic blood loss with iron def, off iron due to goals of care and potential issues with GI upset Lab Results  Component Value Date   HGB 8.4 (A) 04/30/2017  Has CBCs drawn at Dr. Hoyle Sauer office as well   Past Medical History:  Diagnosis Date  . Acute bronchitis   . Acute myocardial infarction, unspecified site, episode of care unspecified 03/07/11  . Asthma   . Cardiomegaly   . CHF (congestive heart failure) ([place])   . Chronic diastolic heart failure ([place])   . Chronic diastolic heart failure ([place]) 01/31/2014   01/2014: 2d echo: EF 39-76%, grade 1 diastolic dysfunction   . Coronary atherosclerosis of native coronary artery     s/p stent diagonal branch 1996; RCA 03/07/11  . Debility, unspecified   . Depression   . Diaphragmatic hernia without mention of obstruction or gangrene   . Dysphagia S/P CVA (cerebrovascular accident)  02/14/2014  . Edema 2006  . Finger numbness 12/28/2013   Left thumb, 1, 2nd fingers  . Herpes zoster complication 2025   complicated, left eye involvement  . Hypertension   . Irritable bowel syndrome   . Macular degeneration (senile) of retina, unspecified 2012   wet  . Nontoxic uninodular goiter    negative [place] 2008  . Open wound of knee, leg (except thigh), and ankle, without mention of complication 42/7062   left leg, complicated wound, extended healing, resolved 01/2013  . Other abnormal blood  chemistry   . Other specified genital prolapse(618.89)    corrected with pessary  . Shoulder pain 03/30/2013  . Stroke ([place])   . TIA (transient ischemic attack)   . Transient ischemic attack (TIA), and cerebral infarction without residual deficits(V12.54) 2008   Past Surgical History:  Procedure Laterality Date  . APPENDECTOMY    . [place], 2001, 2012  . CAROTID STENT     diagonal branch of LCA  . CATARACT EXTRACTION    . CORONARY ANGIOPLASTY    . TONSILLECTOMY AND ADENOIDECTOMY      Allergies  Allergen Reactions  . Crestor [Rosuvastatin] Other (See Comments)    Leg Weakness   . Ace Inhibitors Cough  . Plavix [Clopidogrel] Nausea And Vomiting    Outpatient Encounter Prescriptions as of 07/09/2017  Medication Sig  . acetaminophen (TYLENOL) 500 MG tablet Take 1,000 mg by mouth every 8 (eight) hours as needed.  Marland Kitchen antiseptic oral rinse (BIOTENE) LIQD 15 mLs by Mouth Rinse route at bedtime.  . budesonide (ENTOCORT EC) 3 MG 24 hr capsule Take 3 mg by mouth daily.  . carboxymethylcellulose (REFRESH PLUS) 0.5 % SOLN Place 1 drop into the right eye 2 (two) times daily as needed.  Marland Kitchen esomeprazole (NEXIUM) 40 MG capsule Take 40 mg by mouth daily.  . fluocinolone (SYNALAR) 0.01 % external solution Apply 1-2 application topically 2 (two) times daily as needed.   . furosemide (LASIX) 20 MG tablet Take 20 mg by mouth 2 (two) times a week.   . hydrocortisone cream 1 % Mix with mositurelle cream then apply small amount to affected areas as needed for rash  . losartan (COZAAR) 50 MG tablet Take 50 mg by mouth daily.  . Loteprednol Etabonate (LOTEMAX) 0.5 % GEL Apply 0.5 inches to eye 2 (two) times daily. Left eye  . metoprolol succinate (TOPROL-XL) 50 MG 24 hr tablet Take 75 mg by mouth daily. Take with or immediately following a meal.  . nitroGLYCERIN (NITROSTAT) 0.4 MG SL tablet Place 0.4 mg under the tongue every 5 (five) minutes as needed for chest pain.   Marland Kitchen ondansetron  (ZOFRAN) 4 MG tablet Take 4 mg by mouth every 8 (eight) hours as needed for nausea or vomiting.  . sertraline (ZOLOFT) 50 MG tablet Take 75 mg by mouth daily.   . simethicone (MYLICON) 376 MG chewable tablet Chew 250 mg by mouth 2 (two) times daily.  . traMADol (ULTRAM) 50 MG tablet Take 50 mg by mouth every 8 (eight) hours as needed.  . triamcinolone (KENALOG) 0.025 % cream Apply 1 application topically 2 (two) times daily.  . [DISCONTINUED] iron polysaccharides (NIFEREX) 150 MG capsule Take 150 mg by mouth 2 (two) times daily. One capsule each morning   . [DISCONTINUED] Multiple Vitamins-Minerals (PRESERVISION AREDS 2 PO) Take 1 capsule by mouth 2 (two) times daily.  . [DISCONTINUED] simvastatin (ZOCOR) 20 MG tablet Take 20 mg by mouth daily.   No  facility-administered encounter medications on file as of 07/09/2017.     Review of Systems  Constitutional: Positive for activity change, appetite change, fatigue and unexpected weight change. Negative for chills, diaphoresis and fever.  HENT: Negative for congestion.   Respiratory: Negative for cough, shortness of breath and wheezing.   Cardiovascular: Negative for chest pain, palpitations and leg swelling.  Gastrointestinal: Positive for abdominal pain and nausea. Negative for abdominal distention, constipation and diarrhea.  Genitourinary: Negative for difficulty urinating and dysuria.  Musculoskeletal: Positive for gait problem. Negative for arthralgias, back pain, joint swelling and myalgias.  Neurological: Positive for speech difficulty (due to hx of CVA). Negative for dizziness, tremors, seizures, syncope, facial asymmetry, weakness, light-headedness, numbness and headaches.  Psychiatric/Behavioral: Negative for agitation, behavioral problems and confusion.       Memory loss    Immunization History  Administered Date(s) Administered  . Influenza Inj Mdck Quad Pf 10/09/2016  . Influenza Whole 09/26/2013  . Influenza-Unspecified  09/26/2014, 10/04/2015  . Pneumococcal Conjugate-13 07/31/2016  . Pneumococcal Polysaccharide-23 12/22/2008  . Tdap 10/13/2012   Pertinent  Health Maintenance Due  Topic Date Due  . INFLUENZA VACCINE  07/22/2017  . PNA vac Low Risk Adult  Completed   Fall Risk  04/15/2017 06/20/2016 10/08/2015 09/28/2013  Falls in the past year? No No No No  Risk for fall due to : - - History of fall(s) History of fall(s)  Risk for fall due to (comments): - - - 10/13 fell out of bed   Functional Status Survey:    Vitals:   07/09/17 0931  BP: (!) 142/76  Pulse: 64  Resp: (!) 26  Temp: 98.7 F (37.1 C)  SpO2: 96%  Weight: 141 lb 9.6 oz (64.2 kg)   Body mass index is 27.65 kg/m.  Wt Readings from Last 3 Encounters:  07/09/17 141 lb 9.6 oz (64.2 kg)  06/30/17 141 lb (64 kg)  06/16/17 143 lb (64.9 kg)    Physical Exam  Constitutional: She is oriented to person, place, and time. No distress.  Frail white female  HENT:  Head: Normocephalic and atraumatic.  Neck: No JVD present.  Cardiovascular: Normal rate and regular rhythm.   No murmur heard. Pulmonary/Chest: Effort normal. No respiratory distress. She has no wheezes.  Decreased bases  Abdominal: Soft. Bowel sounds are normal. She exhibits no distension. There is no tenderness.  Neurological: She is alert and oriented to person, place, and time.  Skin: Skin is warm and dry. She is not diaphoretic.  Psychiatric: She has a normal mood and affect.  Vitals reviewed.   Labs reviewed:  Recent Labs  12/02/16 04/30/17  NA 144 141  K 4.4 4.8  BUN 14 20  CREATININE 0.7 0.7    Recent Labs  12/02/16 04/30/17  AST 18 14  ALT 10 12  ALKPHOS 97 79    Recent Labs  09/23/16 0600 12/02/16 04/30/17  WBC 9.7 10.6 10.4  HGB 7.3* 9.1* 8.4*  HCT 22* 31* 26*  PLT 473* 492* 577*   Lab Results  Component Value Date   TSH 1.50 12/02/2016   Lab Results  Component Value Date   HGBA1C 5.7 09/23/2016   Lab Results  Component Value Date    CHOL 150 09/23/2016   HDL 54 09/23/2016   LDLCALC 65 09/23/2016   TRIG 151 09/23/2016   CHOLHDL 3.4 02/01/2014    Significant Diagnostic Results in last 30 days:  No results found.  Assessment/Plan  1. Adenocarcinoma, colon ([place]) Has declined  further treatment and followed by Hospice Overall declining status with poor intake and decreased mobility Continue ultram 50 mg q 8 prn and zofran 4 mg prn Consider morphine as her symptoms progress (she is not interested at this time)  2. Chronic diastolic heart failure ([facility]) Continue lasix 20 mg bi weekly Monitor for s/s of fluid overload  3. Essential hypertension Controlled Continue cozaar 50 mg qd  4. Anemia due to chronic blood loss CBCs are drawn at Dr. Perley Jain office, no need to continue monitoring these at this point as she is no longer on iron  5. Major depressive disorder with single episode, in partial remission ([place]) Would continue current dose of zoloft while she is going through this difficult time   Family/ staff Communication: discussed with staff  Labs/tests ordered:  NA

## 2017-07-14 DIAGNOSIS — G3184 Mild cognitive impairment, so stated: Secondary | ICD-10-CM | POA: Diagnosis not present

## 2017-07-14 DIAGNOSIS — C189 Malignant neoplasm of colon, unspecified: Secondary | ICD-10-CM | POA: Diagnosis not present

## 2017-07-14 DIAGNOSIS — I25810 Atherosclerosis of coronary artery bypass graft(s) without angina pectoris: Secondary | ICD-10-CM | POA: Diagnosis not present

## 2017-07-14 DIAGNOSIS — C772 Secondary and unspecified malignant neoplasm of intra-abdominal lymph nodes: Secondary | ICD-10-CM | POA: Diagnosis not present

## 2017-07-14 DIAGNOSIS — I5030 Unspecified diastolic (congestive) heart failure: Secondary | ICD-10-CM | POA: Diagnosis not present

## 2017-07-14 DIAGNOSIS — I679 Cerebrovascular disease, unspecified: Secondary | ICD-10-CM | POA: Diagnosis not present

## 2017-07-16 DIAGNOSIS — I25810 Atherosclerosis of coronary artery bypass graft(s) without angina pectoris: Secondary | ICD-10-CM | POA: Diagnosis not present

## 2017-07-16 DIAGNOSIS — C189 Malignant neoplasm of colon, unspecified: Secondary | ICD-10-CM | POA: Diagnosis not present

## 2017-07-16 DIAGNOSIS — G3184 Mild cognitive impairment, so stated: Secondary | ICD-10-CM | POA: Diagnosis not present

## 2017-07-16 DIAGNOSIS — C772 Secondary and unspecified malignant neoplasm of intra-abdominal lymph nodes: Secondary | ICD-10-CM | POA: Diagnosis not present

## 2017-07-16 DIAGNOSIS — I679 Cerebrovascular disease, unspecified: Secondary | ICD-10-CM | POA: Diagnosis not present

## 2017-07-16 DIAGNOSIS — I5030 Unspecified diastolic (congestive) heart failure: Secondary | ICD-10-CM | POA: Diagnosis not present

## 2017-07-20 DIAGNOSIS — I5030 Unspecified diastolic (congestive) heart failure: Secondary | ICD-10-CM | POA: Diagnosis not present

## 2017-07-20 DIAGNOSIS — I25810 Atherosclerosis of coronary artery bypass graft(s) without angina pectoris: Secondary | ICD-10-CM | POA: Diagnosis not present

## 2017-07-20 DIAGNOSIS — C772 Secondary and unspecified malignant neoplasm of intra-abdominal lymph nodes: Secondary | ICD-10-CM | POA: Diagnosis not present

## 2017-07-20 DIAGNOSIS — G3184 Mild cognitive impairment, so stated: Secondary | ICD-10-CM | POA: Diagnosis not present

## 2017-07-20 DIAGNOSIS — C189 Malignant neoplasm of colon, unspecified: Secondary | ICD-10-CM | POA: Diagnosis not present

## 2017-07-20 DIAGNOSIS — I679 Cerebrovascular disease, unspecified: Secondary | ICD-10-CM | POA: Diagnosis not present

## 2017-07-22 DIAGNOSIS — H3530 Unspecified macular degeneration: Secondary | ICD-10-CM | POA: Diagnosis not present

## 2017-07-22 DIAGNOSIS — G3184 Mild cognitive impairment, so stated: Secondary | ICD-10-CM | POA: Diagnosis not present

## 2017-07-22 DIAGNOSIS — I25810 Atherosclerosis of coronary artery bypass graft(s) without angina pectoris: Secondary | ICD-10-CM | POA: Diagnosis not present

## 2017-07-22 DIAGNOSIS — F339 Major depressive disorder, recurrent, unspecified: Secondary | ICD-10-CM | POA: Diagnosis not present

## 2017-07-22 DIAGNOSIS — R54 Age-related physical debility: Secondary | ICD-10-CM | POA: Diagnosis not present

## 2017-07-22 DIAGNOSIS — C772 Secondary and unspecified malignant neoplasm of intra-abdominal lymph nodes: Secondary | ICD-10-CM | POA: Diagnosis not present

## 2017-07-22 DIAGNOSIS — L8990 Pressure ulcer of unspecified site, unspecified stage: Secondary | ICD-10-CM | POA: Diagnosis not present

## 2017-07-22 DIAGNOSIS — I4892 Unspecified atrial flutter: Secondary | ICD-10-CM | POA: Diagnosis not present

## 2017-07-22 DIAGNOSIS — I5030 Unspecified diastolic (congestive) heart failure: Secondary | ICD-10-CM | POA: Diagnosis not present

## 2017-07-22 DIAGNOSIS — E785 Hyperlipidemia, unspecified: Secondary | ICD-10-CM | POA: Diagnosis not present

## 2017-07-22 DIAGNOSIS — R1310 Dysphagia, unspecified: Secondary | ICD-10-CM | POA: Diagnosis not present

## 2017-07-22 DIAGNOSIS — I10 Essential (primary) hypertension: Secondary | ICD-10-CM | POA: Diagnosis not present

## 2017-07-22 DIAGNOSIS — L209 Atopic dermatitis, unspecified: Secondary | ICD-10-CM | POA: Diagnosis not present

## 2017-07-22 DIAGNOSIS — K219 Gastro-esophageal reflux disease without esophagitis: Secondary | ICD-10-CM | POA: Diagnosis not present

## 2017-07-22 DIAGNOSIS — C189 Malignant neoplasm of colon, unspecified: Secondary | ICD-10-CM | POA: Diagnosis not present

## 2017-07-22 DIAGNOSIS — I679 Cerebrovascular disease, unspecified: Secondary | ICD-10-CM | POA: Diagnosis not present

## 2017-07-22 DIAGNOSIS — H11149 Conjunctival xerosis, unspecified, unspecified eye: Secondary | ICD-10-CM | POA: Diagnosis not present

## 2017-07-23 DIAGNOSIS — C772 Secondary and unspecified malignant neoplasm of intra-abdominal lymph nodes: Secondary | ICD-10-CM | POA: Diagnosis not present

## 2017-07-23 DIAGNOSIS — I5030 Unspecified diastolic (congestive) heart failure: Secondary | ICD-10-CM | POA: Diagnosis not present

## 2017-07-23 DIAGNOSIS — C189 Malignant neoplasm of colon, unspecified: Secondary | ICD-10-CM | POA: Diagnosis not present

## 2017-07-23 DIAGNOSIS — G3184 Mild cognitive impairment, so stated: Secondary | ICD-10-CM | POA: Diagnosis not present

## 2017-07-23 DIAGNOSIS — I679 Cerebrovascular disease, unspecified: Secondary | ICD-10-CM | POA: Diagnosis not present

## 2017-07-23 DIAGNOSIS — I25810 Atherosclerosis of coronary artery bypass graft(s) without angina pectoris: Secondary | ICD-10-CM | POA: Diagnosis not present

## 2017-07-31 DIAGNOSIS — G3184 Mild cognitive impairment, so stated: Secondary | ICD-10-CM | POA: Diagnosis not present

## 2017-07-31 DIAGNOSIS — C772 Secondary and unspecified malignant neoplasm of intra-abdominal lymph nodes: Secondary | ICD-10-CM | POA: Diagnosis not present

## 2017-07-31 DIAGNOSIS — I679 Cerebrovascular disease, unspecified: Secondary | ICD-10-CM | POA: Diagnosis not present

## 2017-07-31 DIAGNOSIS — C189 Malignant neoplasm of colon, unspecified: Secondary | ICD-10-CM | POA: Diagnosis not present

## 2017-07-31 DIAGNOSIS — I25810 Atherosclerosis of coronary artery bypass graft(s) without angina pectoris: Secondary | ICD-10-CM | POA: Diagnosis not present

## 2017-07-31 DIAGNOSIS — I5030 Unspecified diastolic (congestive) heart failure: Secondary | ICD-10-CM | POA: Diagnosis not present

## 2017-08-05 DIAGNOSIS — I25810 Atherosclerosis of coronary artery bypass graft(s) without angina pectoris: Secondary | ICD-10-CM | POA: Diagnosis not present

## 2017-08-05 DIAGNOSIS — I679 Cerebrovascular disease, unspecified: Secondary | ICD-10-CM | POA: Diagnosis not present

## 2017-08-05 DIAGNOSIS — G3184 Mild cognitive impairment, so stated: Secondary | ICD-10-CM | POA: Diagnosis not present

## 2017-08-05 DIAGNOSIS — C189 Malignant neoplasm of colon, unspecified: Secondary | ICD-10-CM | POA: Diagnosis not present

## 2017-08-05 DIAGNOSIS — I5030 Unspecified diastolic (congestive) heart failure: Secondary | ICD-10-CM | POA: Diagnosis not present

## 2017-08-05 DIAGNOSIS — C772 Secondary and unspecified malignant neoplasm of intra-abdominal lymph nodes: Secondary | ICD-10-CM | POA: Diagnosis not present

## 2017-08-06 ENCOUNTER — Non-Acute Institutional Stay (SKILLED_NURSING_FACILITY): Payer: Medicare Other | Admitting: Adult Health

## 2017-08-06 ENCOUNTER — Encounter: Payer: Self-pay | Admitting: Adult Health

## 2017-08-06 DIAGNOSIS — K529 Noninfective gastroenteritis and colitis, unspecified: Secondary | ICD-10-CM | POA: Diagnosis not present

## 2017-08-06 DIAGNOSIS — I679 Cerebrovascular disease, unspecified: Secondary | ICD-10-CM

## 2017-08-06 DIAGNOSIS — G3184 Mild cognitive impairment, so stated: Secondary | ICD-10-CM | POA: Diagnosis not present

## 2017-08-06 DIAGNOSIS — C189 Malignant neoplasm of colon, unspecified: Secondary | ICD-10-CM

## 2017-08-06 DIAGNOSIS — R002 Palpitations: Secondary | ICD-10-CM

## 2017-08-06 DIAGNOSIS — K449 Diaphragmatic hernia without obstruction or gangrene: Secondary | ICD-10-CM | POA: Diagnosis not present

## 2017-08-06 DIAGNOSIS — I10 Essential (primary) hypertension: Secondary | ICD-10-CM | POA: Diagnosis not present

## 2017-08-06 NOTE — Progress Notes (Signed)
Location:  Occupational psychologist of Service:  SNF (31) Provider:   Cindi Carbon, ANP [place] [phone]   Gayland Curry, DO  Patient Care Team: Gayland Curry, DO as PCP - General (Geriatric Medicine) [facility], [facility] [place], Thana Farr, MD as Consulting Physician (Cardiology) [name], NP as Nurse Practitioner (Obstetrics and Gynecology) Clarene Essex, MD as Consulting Physician (Gastroenterology)  Extended Emergency Contact Information Primary Emergency Contact: Lowe,Susan Address: [address]          [place] [address] [name] of [place] Phone: [phone] Relation: Friend Secondary Emergency Contact: Jeanella Craze Address: [address]          [place], [place] [address] [name] of [place] Phone: [phone] Work Phone: [phone] Mobile Phone: [phone] Relation: Friend  Code Status:  DNR Goals of care: Advanced Directive information Advanced Directives 08/06/2017  Does Patient Have a Medical Advance Directive? Yes  Type of Advance Directive Out of facility DNR (pink MOST or yellow form);[place];Living will  Does patient want to make changes to medical advance directive? -  Copy of [place] in Chart? Yes  Pre-existing out of facility DNR order (yellow form or pink MOST form) Yellow form placed in chart (order not valid for inpatient use)     Chief Complaint  Patient presents with  . Medical Management of Chronic Issues    HPI:  Pt is a 83 y.o. female seen today for medical management of chronic diseases.  Resides in skilled care.  She has a hx of a colon mass was found on CT of the abd 06/12/16 on the right ascending colon. She is followed by Dr. Watt Climes and also on budesonide. She is currently followed by hospice. She reports the RLQ pain is mild but present, actually better than last month. She has not used any ultram (she likes to avoid  medication). The last note from Dr. Watt Climes indicated she would follow up in Sept but she has not made any additional apts. Her goals of care comfort based. She reports that she has a normal appetite but the staff report she is eating less and the weights certainly show that.   Wt Readings from Last 3 Encounters:  08/06/17 135 lb 3.2 oz (61.3 kg)  07/09/17 141 lb 9.6 oz (64.2 kg)  06/30/17 141 lb (64 kg)   Reports gas regularly that causes pain. She is currently on simethicone and Budesonide (for colitis?) She is getting up out of bed more and getting her nails done.  Past Medical History:  Diagnosis Date  . Acute bronchitis   . Acute myocardial infarction, unspecified site, episode of care unspecified 03/07/11  . Asthma   . Cardiomegaly   . CHF (congestive heart failure) ([place])   . Chronic diastolic heart failure ([place])   . Chronic diastolic heart failure ([place]) 01/31/2014   01/2014: 2d echo: EF 01-74%, grade 1 diastolic dysfunction   . Coronary atherosclerosis of native coronary artery     s/p stent diagonal branch 1996; RCA 03/07/11  . Debility, unspecified   . Depression   . Diaphragmatic hernia without mention of obstruction or gangrene   . Dysphagia S/P CVA (cerebrovascular accident) 02/14/2014  . Edema 2006  . Finger numbness 12/28/2013   Left thumb, 1, 2nd fingers  . Herpes zoster complication 9449   complicated, left eye involvement  . Hypertension   . Irritable bowel syndrome   . Macular degeneration (senile) of  retina, unspecified 2012   wet  . Nontoxic uninodular goiter    negative [place] 2008  . Open wound of knee, leg (except thigh), and ankle, without mention of complication 40/1027   left leg, complicated wound, extended healing, resolved 01/2013  . Other abnormal blood chemistry   . Other specified genital prolapse(618.89)    corrected with pessary  . Shoulder pain 03/30/2013  . Stroke ([place])   . TIA (transient ischemic attack)   . Transient ischemic attack (TIA), and  cerebral infarction without residual deficits(V12.54) 2008   Past Surgical History:  Procedure Laterality Date  . APPENDECTOMY    . [place], 2001, 2012  . CAROTID STENT     diagonal branch of LCA  . CATARACT EXTRACTION    . CORONARY ANGIOPLASTY    . TONSILLECTOMY AND ADENOIDECTOMY      Allergies  Allergen Reactions  . Crestor [Rosuvastatin] Other (See Comments)    Leg Weakness   . Ace Inhibitors Cough  . Plavix [Clopidogrel] Nausea And Vomiting    Outpatient Encounter Prescriptions as of 08/06/2017  Medication Sig  . acetaminophen (TYLENOL) 500 MG tablet Take 1,000 mg by mouth every 8 (eight) hours as needed.  Marland Kitchen antiseptic oral rinse (BIOTENE) LIQD 15 mLs by Mouth Rinse route at bedtime.  . budesonide (ENTOCORT EC) 3 MG 24 hr capsule Take 3 mg by mouth daily.  . carboxymethylcellulose (REFRESH PLUS) 0.5 % SOLN Place 1 drop into the right eye 2 (two) times daily as needed.  Marland Kitchen esomeprazole (NEXIUM) 40 MG capsule Take 40 mg by mouth daily.  . fluocinolone (SYNALAR) 0.01 % external solution Apply 1-2 application topically 2 (two) times daily as needed.   . furosemide (LASIX) 20 MG tablet Take 20 mg by mouth 2 (two) times a week.   . hydrocortisone cream 1 % Mix with mositurelle cream then apply small amount to affected areas as needed for rash  . losartan (COZAAR) 50 MG tablet Take 50 mg by mouth daily.  . Loteprednol Etabonate (LOTEMAX) 0.5 % GEL Apply 0.5 inches to eye 2 (two) times daily. Left eye  . metoprolol succinate (TOPROL-XL) 50 MG 24 hr tablet Take 75 mg by mouth daily. Take with or immediately following a meal.  . nitroGLYCERIN (NITROSTAT) 0.4 MG SL tablet Place 0.4 mg under the tongue every 5 (five) minutes as needed for chest pain.   Marland Kitchen ondansetron (ZOFRAN) 4 MG tablet Take 4 mg by mouth every 8 (eight) hours as needed for nausea or vomiting.  . sertraline (ZOLOFT) 50 MG tablet Take 75 mg by mouth daily.   . simethicone (MYLICON) 253 MG chewable  tablet Chew 250 mg by mouth 2 (two) times daily.  . traMADol (ULTRAM) 50 MG tablet Take 50 mg by mouth every 8 (eight) hours as needed.  . triamcinolone (KENALOG) 0.025 % cream Apply 1 application topically 2 (two) times daily as needed.    No facility-administered encounter medications on file as of 08/06/2017.     Review of Systems  Constitutional: Positive for activity change, appetite change, fatigue and unexpected weight change. Negative for chills, diaphoresis and fever.  HENT: Negative for congestion.        Ear itches  Respiratory: Negative for cough, shortness of breath and wheezing.   Cardiovascular: Negative for chest pain, palpitations and leg swelling.  Gastrointestinal: Positive for abdominal pain (improved). Negative for abdominal distention, constipation, diarrhea and nausea.  Genitourinary: Negative for difficulty urinating and dysuria.  Musculoskeletal: Positive for gait  problem. Negative for arthralgias, back pain, joint swelling and myalgias.  Neurological: Positive for speech difficulty (due to hx of CVA). Negative for dizziness, tremors, seizures, syncope, facial asymmetry, weakness, light-headedness, numbness and headaches.  Psychiatric/Behavioral: Negative for agitation, behavioral problems and confusion.       Memory loss    Immunization History  Administered Date(s) Administered  . Influenza Inj Mdck Quad Pf 10/09/2016  . Influenza Whole 09/26/2013  . Influenza-Unspecified 09/26/2014, 10/04/2015  . Pneumococcal Conjugate-13 07/31/2016  . Pneumococcal Polysaccharide-23 12/22/2008  . Tdap 10/13/2012   Pertinent  Health Maintenance Due  Topic Date Due  . INFLUENZA VACCINE  07/22/2017  . PNA vac Low Risk Adult  Completed   Fall Risk  04/15/2017 06/20/2016 10/08/2015 09/28/2013  Falls in the past year? No No No No  Risk for fall due to : - - History of fall(s) History of fall(s)  Risk for fall due to: Comment - - - 10/13 fell out of bed   Functional Status  Survey:    Vitals:   08/06/17 1439  BP: 126/69  Pulse: 69  Resp: 14  Temp: (!) 97 F (36.1 C)  SpO2: 91%  Weight: 135 lb 3.2 oz (61.3 kg)   Body mass index is 26.4 kg/m.  Wt Readings from Last 3 Encounters:  08/06/17 135 lb 3.2 oz (61.3 kg)  07/09/17 141 lb 9.6 oz (64.2 kg)  06/30/17 141 lb (64 kg)    Physical Exam  Constitutional: She is oriented to person, place, and time. No distress.  Frail white female  HENT:  Head: Normocephalic and atraumatic.  Right Ear: External ear normal.  Left Ear: External ear normal.  Nose: Nose normal.  Mouth/Throat: Oropharynx is clear and moist. No oropharyngeal exudate.  Neck: No JVD present.  Cardiovascular: Normal rate and regular rhythm.   No murmur heard. Pulmonary/Chest: Effort normal. No respiratory distress. She has no wheezes.  Decreased bases  Abdominal: Soft. Bowel sounds are normal. She exhibits no distension. There is tenderness (general tenderness per pt).  Musculoskeletal: She exhibits no edema or tenderness.  Lymphadenopathy:    She has no cervical adenopathy.  Neurological: She is alert and oriented to person, place, and time.  Skin: Skin is warm and dry. She is not diaphoretic.  Psychiatric: She has a normal mood and affect.  Nursing note and vitals reviewed.   Labs reviewed:  Recent Labs  12/02/16 04/30/17  NA 144 141  K 4.4 4.8  BUN 14 20  CREATININE 0.7 0.7    Recent Labs  12/02/16 04/30/17  AST 18 14  ALT 10 12  ALKPHOS 97 79    Recent Labs  09/23/16 0600 12/02/16 04/30/17  WBC 9.7 10.6 10.4  HGB 7.3* 9.1* 8.4*  HCT 22* 31* 26*  PLT 473* 492* 577*   Lab Results  Component Value Date   TSH 1.50 12/02/2016   Lab Results  Component Value Date   HGBA1C 5.7 09/23/2016   Lab Results  Component Value Date   CHOL 150 09/23/2016   HDL 54 09/23/2016   LDLCALC 65 09/23/2016   TRIG 151 09/23/2016   CHOLHDL 3.4 02/01/2014    Significant Diagnostic Results in last 30 days:  No results  found.  Assessment/Plan   1. Adenocarcinoma, colon ([place]) Followed by hospice Continues to eat less and lose weight but her pain is better Continue prn ultram No aggressive care  2. Noninfectious gastroenteritis, unspecified type Continue Budesonide prescribed per Dr. Watt Climes No s/e noted such as edema  or weight gain  3. Mild cognitive impairment with memory loss Noted, skilled care level  4. Heart palpitations Denies any palpitations  Continue Toprol 75 mg qd  5. Essential hypertension Controlled with current medications  6. Cerebrovascular disease S/P CVA No longer on antiplatelet therapy due to bloody stools No aggressive monitoring due to goals of care  7. Hiatal hernia Hx of this with GERD and heme pos stools Continue Nexium 40 mg qd   Family/ staff Communication: discussed with staff  Labs/tests ordered:  NA

## 2017-08-13 DIAGNOSIS — I25810 Atherosclerosis of coronary artery bypass graft(s) without angina pectoris: Secondary | ICD-10-CM | POA: Diagnosis not present

## 2017-08-13 DIAGNOSIS — I5030 Unspecified diastolic (congestive) heart failure: Secondary | ICD-10-CM | POA: Diagnosis not present

## 2017-08-13 DIAGNOSIS — G3184 Mild cognitive impairment, so stated: Secondary | ICD-10-CM | POA: Diagnosis not present

## 2017-08-13 DIAGNOSIS — C772 Secondary and unspecified malignant neoplasm of intra-abdominal lymph nodes: Secondary | ICD-10-CM | POA: Diagnosis not present

## 2017-08-13 DIAGNOSIS — C189 Malignant neoplasm of colon, unspecified: Secondary | ICD-10-CM | POA: Diagnosis not present

## 2017-08-13 DIAGNOSIS — I679 Cerebrovascular disease, unspecified: Secondary | ICD-10-CM | POA: Diagnosis not present

## 2017-08-20 DIAGNOSIS — G3184 Mild cognitive impairment, so stated: Secondary | ICD-10-CM | POA: Diagnosis not present

## 2017-08-20 DIAGNOSIS — I25810 Atherosclerosis of coronary artery bypass graft(s) without angina pectoris: Secondary | ICD-10-CM | POA: Diagnosis not present

## 2017-08-20 DIAGNOSIS — I5030 Unspecified diastolic (congestive) heart failure: Secondary | ICD-10-CM | POA: Diagnosis not present

## 2017-08-20 DIAGNOSIS — I679 Cerebrovascular disease, unspecified: Secondary | ICD-10-CM | POA: Diagnosis not present

## 2017-08-20 DIAGNOSIS — C189 Malignant neoplasm of colon, unspecified: Secondary | ICD-10-CM | POA: Diagnosis not present

## 2017-08-20 DIAGNOSIS — C772 Secondary and unspecified malignant neoplasm of intra-abdominal lymph nodes: Secondary | ICD-10-CM | POA: Diagnosis not present

## 2017-08-21 DIAGNOSIS — C189 Malignant neoplasm of colon, unspecified: Secondary | ICD-10-CM | POA: Diagnosis not present

## 2017-08-21 DIAGNOSIS — I5030 Unspecified diastolic (congestive) heart failure: Secondary | ICD-10-CM | POA: Diagnosis not present

## 2017-08-21 DIAGNOSIS — I679 Cerebrovascular disease, unspecified: Secondary | ICD-10-CM | POA: Diagnosis not present

## 2017-08-21 DIAGNOSIS — G3184 Mild cognitive impairment, so stated: Secondary | ICD-10-CM | POA: Diagnosis not present

## 2017-08-21 DIAGNOSIS — I25810 Atherosclerosis of coronary artery bypass graft(s) without angina pectoris: Secondary | ICD-10-CM | POA: Diagnosis not present

## 2017-08-21 DIAGNOSIS — C772 Secondary and unspecified malignant neoplasm of intra-abdominal lymph nodes: Secondary | ICD-10-CM | POA: Diagnosis not present

## 2017-08-22 DIAGNOSIS — C189 Malignant neoplasm of colon, unspecified: Secondary | ICD-10-CM | POA: Diagnosis not present

## 2017-08-22 DIAGNOSIS — R54 Age-related physical debility: Secondary | ICD-10-CM | POA: Diagnosis not present

## 2017-08-22 DIAGNOSIS — I25810 Atherosclerosis of coronary artery bypass graft(s) without angina pectoris: Secondary | ICD-10-CM | POA: Diagnosis not present

## 2017-08-22 DIAGNOSIS — I679 Cerebrovascular disease, unspecified: Secondary | ICD-10-CM | POA: Diagnosis not present

## 2017-08-22 DIAGNOSIS — L209 Atopic dermatitis, unspecified: Secondary | ICD-10-CM | POA: Diagnosis not present

## 2017-08-22 DIAGNOSIS — K219 Gastro-esophageal reflux disease without esophagitis: Secondary | ICD-10-CM | POA: Diagnosis not present

## 2017-08-22 DIAGNOSIS — G3184 Mild cognitive impairment, so stated: Secondary | ICD-10-CM | POA: Diagnosis not present

## 2017-08-22 DIAGNOSIS — H3530 Unspecified macular degeneration: Secondary | ICD-10-CM | POA: Diagnosis not present

## 2017-08-22 DIAGNOSIS — C772 Secondary and unspecified malignant neoplasm of intra-abdominal lymph nodes: Secondary | ICD-10-CM | POA: Diagnosis not present

## 2017-08-22 DIAGNOSIS — E785 Hyperlipidemia, unspecified: Secondary | ICD-10-CM | POA: Diagnosis not present

## 2017-08-22 DIAGNOSIS — I5030 Unspecified diastolic (congestive) heart failure: Secondary | ICD-10-CM | POA: Diagnosis not present

## 2017-08-22 DIAGNOSIS — I10 Essential (primary) hypertension: Secondary | ICD-10-CM | POA: Diagnosis not present

## 2017-08-22 DIAGNOSIS — F339 Major depressive disorder, recurrent, unspecified: Secondary | ICD-10-CM | POA: Diagnosis not present

## 2017-08-22 DIAGNOSIS — H11149 Conjunctival xerosis, unspecified, unspecified eye: Secondary | ICD-10-CM | POA: Diagnosis not present

## 2017-08-22 DIAGNOSIS — R1310 Dysphagia, unspecified: Secondary | ICD-10-CM | POA: Diagnosis not present

## 2017-08-22 DIAGNOSIS — I4892 Unspecified atrial flutter: Secondary | ICD-10-CM | POA: Diagnosis not present

## 2017-08-22 DIAGNOSIS — L8990 Pressure ulcer of unspecified site, unspecified stage: Secondary | ICD-10-CM | POA: Diagnosis not present

## 2017-08-25 DIAGNOSIS — I5030 Unspecified diastolic (congestive) heart failure: Secondary | ICD-10-CM | POA: Diagnosis not present

## 2017-08-25 DIAGNOSIS — I679 Cerebrovascular disease, unspecified: Secondary | ICD-10-CM | POA: Diagnosis not present

## 2017-08-25 DIAGNOSIS — C189 Malignant neoplasm of colon, unspecified: Secondary | ICD-10-CM | POA: Diagnosis not present

## 2017-08-25 DIAGNOSIS — C772 Secondary and unspecified malignant neoplasm of intra-abdominal lymph nodes: Secondary | ICD-10-CM | POA: Diagnosis not present

## 2017-08-25 DIAGNOSIS — I25810 Atherosclerosis of coronary artery bypass graft(s) without angina pectoris: Secondary | ICD-10-CM | POA: Diagnosis not present

## 2017-08-25 DIAGNOSIS — G3184 Mild cognitive impairment, so stated: Secondary | ICD-10-CM | POA: Diagnosis not present

## 2017-08-28 DIAGNOSIS — I5030 Unspecified diastolic (congestive) heart failure: Secondary | ICD-10-CM | POA: Diagnosis not present

## 2017-08-28 DIAGNOSIS — C772 Secondary and unspecified malignant neoplasm of intra-abdominal lymph nodes: Secondary | ICD-10-CM | POA: Diagnosis not present

## 2017-08-28 DIAGNOSIS — G3184 Mild cognitive impairment, so stated: Secondary | ICD-10-CM | POA: Diagnosis not present

## 2017-08-28 DIAGNOSIS — I679 Cerebrovascular disease, unspecified: Secondary | ICD-10-CM | POA: Diagnosis not present

## 2017-08-28 DIAGNOSIS — I25810 Atherosclerosis of coronary artery bypass graft(s) without angina pectoris: Secondary | ICD-10-CM | POA: Diagnosis not present

## 2017-08-28 DIAGNOSIS — C189 Malignant neoplasm of colon, unspecified: Secondary | ICD-10-CM | POA: Diagnosis not present

## 2017-09-02 DIAGNOSIS — I5030 Unspecified diastolic (congestive) heart failure: Secondary | ICD-10-CM | POA: Diagnosis not present

## 2017-09-02 DIAGNOSIS — C772 Secondary and unspecified malignant neoplasm of intra-abdominal lymph nodes: Secondary | ICD-10-CM | POA: Diagnosis not present

## 2017-09-02 DIAGNOSIS — I25810 Atherosclerosis of coronary artery bypass graft(s) without angina pectoris: Secondary | ICD-10-CM | POA: Diagnosis not present

## 2017-09-02 DIAGNOSIS — C189 Malignant neoplasm of colon, unspecified: Secondary | ICD-10-CM | POA: Diagnosis not present

## 2017-09-02 DIAGNOSIS — G3184 Mild cognitive impairment, so stated: Secondary | ICD-10-CM | POA: Diagnosis not present

## 2017-09-02 DIAGNOSIS — I679 Cerebrovascular disease, unspecified: Secondary | ICD-10-CM | POA: Diagnosis not present

## 2017-09-03 ENCOUNTER — Non-Acute Institutional Stay (SKILLED_NURSING_FACILITY): Payer: Medicare Other | Admitting: Adult Health

## 2017-09-03 ENCOUNTER — Encounter: Payer: Self-pay | Admitting: Adult Health

## 2017-09-03 DIAGNOSIS — I5030 Unspecified diastolic (congestive) heart failure: Secondary | ICD-10-CM | POA: Diagnosis not present

## 2017-09-03 DIAGNOSIS — I25810 Atherosclerosis of coronary artery bypass graft(s) without angina pectoris: Secondary | ICD-10-CM | POA: Diagnosis not present

## 2017-09-03 DIAGNOSIS — R0603 Acute respiratory distress: Secondary | ICD-10-CM | POA: Diagnosis not present

## 2017-09-03 DIAGNOSIS — I5032 Chronic diastolic (congestive) heart failure: Secondary | ICD-10-CM | POA: Diagnosis not present

## 2017-09-03 DIAGNOSIS — C772 Secondary and unspecified malignant neoplasm of intra-abdominal lymph nodes: Secondary | ICD-10-CM | POA: Diagnosis not present

## 2017-09-03 DIAGNOSIS — C189 Malignant neoplasm of colon, unspecified: Secondary | ICD-10-CM | POA: Diagnosis not present

## 2017-09-03 DIAGNOSIS — I679 Cerebrovascular disease, unspecified: Secondary | ICD-10-CM | POA: Diagnosis not present

## 2017-09-03 DIAGNOSIS — G3184 Mild cognitive impairment, so stated: Secondary | ICD-10-CM | POA: Diagnosis not present

## 2017-09-03 NOTE — Progress Notes (Signed)
Location:  Occupational psychologist of Service:  SNF (31) Provider:   Cindi Carbon, ANP [place] [phone]   Gayland Curry, DO  Patient Care Team: Gayland Curry, DO as PCP - General (Geriatric Medicine) [facility] [place], Thana Farr, MD as Consulting Physician (Cardiology) [name], NP as Nurse Practitioner (Obstetrics and Gynecology) Clarene Essex, MD as Consulting Physician (Gastroenterology)  Extended Emergency Contact Information Primary Emergency Contact: Lowe,Susan Address: [address]          [place] [address] [name] of [place] Phone: [phone] Relation: Friend Secondary Emergency Contact: Jeanella Craze Address: [address]          [place], [place] [address] [name] of [place] Phone: [phone] Work Phone: [phone] Mobile Phone: [phone] Relation: Friend  Code Status:  DNR Goals of care: Advanced Directive information Advanced Directives 09/03/2017  Does Patient Have a Medical Advance Directive? Yes  Type of Advance Directive Out of facility DNR (pink MOST or yellow form);[place];Living will  Does patient want to make changes to medical advance directive? -  Copy of [place] in Chart? Yes  Pre-existing out of facility DNR order (yellow form or pink MOST form) Yellow form placed in chart (order not valid for inpatient use)     Chief Complaint  Patient presents with  . Medical Management of Chronic Issues    HPI:  Pt is a 83 y.o. female seen today for medical management of chronic diseases.  She resides in skilled care and will be 83 y.o. In November.  She has a hx of GIB with anemia secondary to adenocarcinoma of the colon. She has decided to forego additional treatment and is followed by Hospice. This morning for my visit she denies abd pain but reports wheezing and shortness of breath. She does not want any treatment for  this issue. Previously she has had abd pain and declined scheduled tylenol and morphine.  She has been losing weight and eating less over time.   Chronic diastolic CHF: Weights trending downward, on lasix 20 mg qod and losartan 01/2014: 2d echo: EF 25-42%, grade 1 diastolic dysfunction  Wt Readings from Last 3 Encounters:  09/03/17 130 lb 6.4 oz (59.1 kg)  08/06/17 135 lb 3.2 oz (61.3 kg)  07/09/17 141 lb 9.6 oz (64.2 kg)     Past Medical History:  Diagnosis Date  . Acute bronchitis   . Acute myocardial infarction, unspecified site, episode of care unspecified 03/07/11  . Asthma   . Cardiomegaly   . CHF (congestive heart failure) ([place])   . Chronic diastolic heart failure ([place])   . Chronic diastolic heart failure ([place]) 01/31/2014   01/2014: 2d echo: EF 70-62%, grade 1 diastolic dysfunction   . Coronary atherosclerosis of native coronary artery     s/p stent diagonal branch 1996; RCA 03/07/11  . Debility, unspecified   . Depression   . Diaphragmatic hernia without mention of obstruction or gangrene   . Dysphagia S/P CVA (cerebrovascular accident) 02/14/2014  . Edema 2006  . Finger numbness 12/28/2013   Left thumb, 1, 2nd fingers  . Herpes zoster complication 3762   complicated, left eye involvement  . Hypertension   . Irritable bowel syndrome   . Macular degeneration (senile) of retina, unspecified 2012   wet  . Nontoxic uninodular goiter    negative [place] 2008  . Open wound of knee, leg (except thigh), and ankle, without mention of  complication 19/4174   left leg, complicated wound, extended healing, resolved 01/2013  . Other abnormal blood chemistry   . Other specified genital prolapse(618.89)    corrected with pessary  . Shoulder pain 03/30/2013  . Stroke ([place])   . TIA (transient ischemic attack)   . Transient ischemic attack (TIA), and cerebral infarction without residual deficits(V12.54) 2008   Past Surgical History:  Procedure Laterality Date  . APPENDECTOMY    . [place], 2001, 2012  . CAROTID STENT     diagonal branch of LCA  . CATARACT EXTRACTION    . CORONARY ANGIOPLASTY    . TONSILLECTOMY AND ADENOIDECTOMY      Allergies  Allergen Reactions  . Crestor [Rosuvastatin] Other (See Comments)    Leg Weakness   . Ace Inhibitors Cough  . Plavix [Clopidogrel] Nausea And Vomiting    Outpatient Encounter Prescriptions as of 09/03/2017  Medication Sig  . acetaminophen (TYLENOL) 500 MG tablet Take 1,000 mg by mouth every 8 (eight) hours as needed.  Marland Kitchen antiseptic oral rinse (BIOTENE) LIQD 15 mLs by Mouth Rinse route at bedtime.  . budesonide (ENTOCORT EC) 3 MG 24 hr capsule Take 3 mg by mouth daily.  . carboxymethylcellulose (REFRESH PLUS) 0.5 % SOLN Place 1 drop into the right eye 2 (two) times daily as needed.  Marland Kitchen esomeprazole (NEXIUM) 40 MG capsule Take 40 mg by mouth daily.  . fluocinolone (SYNALAR) 0.01 % external solution Apply 1-2 application topically 2 (two) times daily as needed.   . furosemide (LASIX) 20 MG tablet Take 20 mg by mouth 2 (two) times a week.   . hydrocortisone cream 1 % Mix with mositurelle cream then apply small amount to affected areas as needed for rash  . losartan (COZAAR) 50 MG tablet Take 50 mg by mouth daily.  . Loteprednol Etabonate (LOTEMAX) 0.5 % GEL Apply 0.5 inches to eye 2 (two) times daily. Left eye  . metoprolol succinate (TOPROL-XL) 50 MG 24 hr tablet Take 75 mg by mouth daily. Take with or immediately following a meal.  . nitroGLYCERIN (NITROSTAT) 0.4 MG SL tablet Place 0.4 mg under the tongue every 5 (five) minutes as needed for chest pain.   Marland Kitchen ondansetron (ZOFRAN) 4 MG tablet Take 4 mg by mouth every 8 (eight) hours as needed for nausea or vomiting.  . sertraline (ZOLOFT) 50 MG tablet Take 75 mg by mouth daily.   . simethicone (MYLICON) 081 MG chewable tablet Chew 250 mg by mouth 2 (two) times daily.  . traMADol (ULTRAM) 50 MG tablet Take 50 mg by mouth every 8 (eight) hours as needed.  .  triamcinolone (KENALOG) 0.025 % cream Apply 1 application topically 2 (two) times daily as needed.    No facility-administered encounter medications on file as of 09/03/2017.     Review of Systems  Constitutional: Positive for appetite change, fatigue and unexpected weight change. Negative for activity change, chills, diaphoresis and fever.  HENT: Positive for trouble swallowing. Negative for congestion.   Respiratory: Positive for cough (chronic without sputum production), shortness of breath and wheezing.   Gastrointestinal: Positive for blood in stool. Negative for abdominal distention, abdominal pain, anal bleeding, constipation, diarrhea, nausea, rectal pain and vomiting.  Genitourinary: Negative for difficulty urinating and dysuria.  Musculoskeletal: Positive for arthralgias and gait problem. Negative for back pain.  Neurological: Positive for speech difficulty and weakness. Negative for dizziness and tremors.  Psychiatric/Behavioral: Positive for confusion. Negative for agitation and behavioral problems.  Immunization History  Administered Date(s) Administered  . Influenza Inj Mdck Quad Pf 10/09/2016  . Influenza Whole 09/26/2013  . Influenza-Unspecified 09/26/2014, 10/04/2015  . Pneumococcal Conjugate-13 07/31/2016  . Pneumococcal Polysaccharide-23 12/22/2008  . Tdap 10/13/2012   Pertinent  Health Maintenance Due  Topic Date Due  . INFLUENZA VACCINE  07/22/2017  . PNA vac Low Risk Adult  Completed   Fall Risk  04/15/2017 06/20/2016 10/08/2015 09/28/2013  Falls in the past year? No No No No  Risk for fall due to : - - History of fall(s) History of fall(s)  Risk for fall due to: Comment - - - 10/13 fell out of bed   Functional Status Survey:    Vitals:   09/03/17 1658  BP: 114/67  Pulse: 73  Resp: 20  Temp: (!) 97.1 F (36.2 C)  SpO2: 98%  Weight: 130 lb 6.4 oz (59.1 kg)   There is no height or weight on file to calculate BMI. Physical Exam  Constitutional: She  is oriented to person, place, and time. She appears distressed.  HENT:  Head: Normocephalic and atraumatic.  Neck: No JVD present. No tracheal deviation present.  Cardiovascular: Normal rate and regular rhythm.   No murmur heard. Pulmonary/Chest: Effort normal. No respiratory distress. She has wheezes.  Increased work of breathing with accessory muscle use  Abdominal: Soft. Bowel sounds are normal. She exhibits no distension. There is no tenderness.  Musculoskeletal: She exhibits no edema.  Lymphadenopathy:    She has no cervical adenopathy.  Neurological: She is alert and oriented to person, place, and time.  Skin: Skin is warm and dry. She is not diaphoretic. There is pallor.  Psychiatric: She has a normal mood and affect.    Labs reviewed:  Recent Labs  12/02/16 04/30/17  NA 144 141  K 4.4 4.8  BUN 14 20  CREATININE 0.7 0.7    Recent Labs  12/02/16 04/30/17  AST 18 14  ALT 10 12  ALKPHOS 97 79    Recent Labs  09/23/16 0600 12/02/16 04/30/17  WBC 9.7 10.6 10.4  HGB 7.3* 9.1* 8.4*  HCT 22* 31* 26*  PLT 473* 492* 577*   Lab Results  Component Value Date   TSH 1.50 12/02/2016   Lab Results  Component Value Date   HGBA1C 5.7 09/23/2016   Lab Results  Component Value Date   CHOL 150 09/23/2016   HDL 54 09/23/2016   LDLCALC 65 09/23/2016   TRIG 151 09/23/2016   CHOLHDL 3.4 02/01/2014    Significant Diagnostic Results in last 30 days:  No results found.  Assessment/Plan  1. Acute respiratory distress Give morphine 5mg  liquid q 2 hrs prn sob/pain Apply oxygen 2L for comfort Given duoneb x 1 now and q 6 hrs prn sob/wheeze Appears to be nearing the end of life. We have scheduled a caregiver to be with her during this time to provide support.   2. Adenocarcinoma, colon ([facility]) Appears pale and weak with progressive weight loss Goals of care are comfort based with no life prolonging measures Followed by hospice.  3. Chronic diastolic CHF No edema,  weight trending downward Remains on lasix 20 mg qod, poor intake and losing weight   Family/ staff Communication: discussed with resident/staff  Labs/tests ordered:  NA

## 2017-09-07 DIAGNOSIS — I679 Cerebrovascular disease, unspecified: Secondary | ICD-10-CM | POA: Diagnosis not present

## 2017-09-07 DIAGNOSIS — G3184 Mild cognitive impairment, so stated: Secondary | ICD-10-CM | POA: Diagnosis not present

## 2017-09-07 DIAGNOSIS — C772 Secondary and unspecified malignant neoplasm of intra-abdominal lymph nodes: Secondary | ICD-10-CM | POA: Diagnosis not present

## 2017-09-07 DIAGNOSIS — I5030 Unspecified diastolic (congestive) heart failure: Secondary | ICD-10-CM | POA: Diagnosis not present

## 2017-09-07 DIAGNOSIS — I25810 Atherosclerosis of coronary artery bypass graft(s) without angina pectoris: Secondary | ICD-10-CM | POA: Diagnosis not present

## 2017-09-07 DIAGNOSIS — C189 Malignant neoplasm of colon, unspecified: Secondary | ICD-10-CM | POA: Diagnosis not present

## 2017-09-09 DIAGNOSIS — I5030 Unspecified diastolic (congestive) heart failure: Secondary | ICD-10-CM | POA: Diagnosis not present

## 2017-09-09 DIAGNOSIS — C189 Malignant neoplasm of colon, unspecified: Secondary | ICD-10-CM | POA: Diagnosis not present

## 2017-09-09 DIAGNOSIS — I679 Cerebrovascular disease, unspecified: Secondary | ICD-10-CM | POA: Diagnosis not present

## 2017-09-09 DIAGNOSIS — I25810 Atherosclerosis of coronary artery bypass graft(s) without angina pectoris: Secondary | ICD-10-CM | POA: Diagnosis not present

## 2017-09-09 DIAGNOSIS — C772 Secondary and unspecified malignant neoplasm of intra-abdominal lymph nodes: Secondary | ICD-10-CM | POA: Diagnosis not present

## 2017-09-09 DIAGNOSIS — G3184 Mild cognitive impairment, so stated: Secondary | ICD-10-CM | POA: Diagnosis not present

## 2017-09-21 DEATH — deceased

## 2018-04-08 IMAGING — CT CT ABD-PELV W/ CM
3 of 5 series · 12 of 36 positions shown, 18 images · IV contrast (READICAT/WATER & [ID] ISOVUE 300)
Comparison: CT scan from 7333.

CLINICAL DATA: Anemia and diarrhea.  History of IBS.

EXAM:
CT ABDOMEN AND PELVIS WITH CONTRAST
TECHNIQUE: Multidetector CT imaging of the abdomen and pelvis was performed
using the standard protocol following bolus administration of
intravenous contrast.
CONTRAST:  100mL ZTRPNF-OHH IOPAMIDOL (ZTRPNF-OHH) INJECTION 61%

[Series 3: abd/pelvis with · axial · 0.78mm/px · z∈[-354,-24]mm · 8 of 86 slices shown, 13 images]
[im 10/86  soft-tissue]
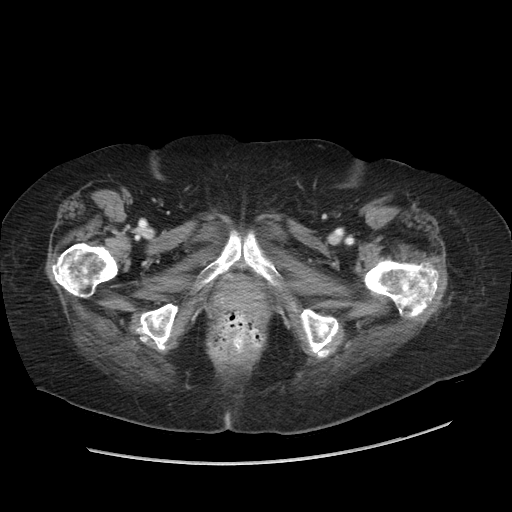
[im 10/86  bone]
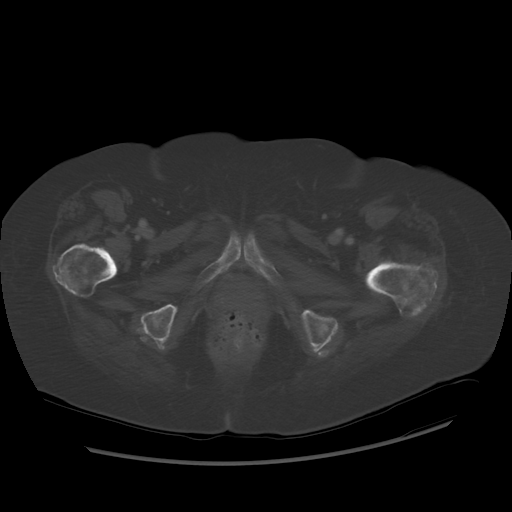
[im 19/86  soft-tissue]
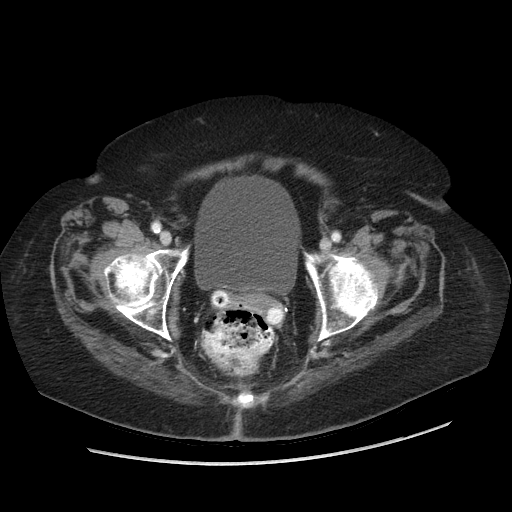
[im 29/86  soft-tissue]
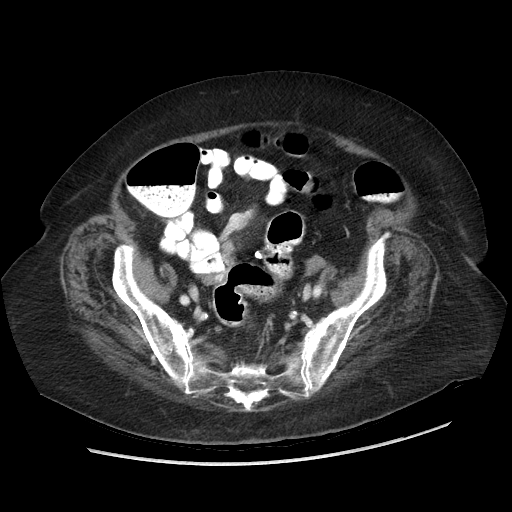
[im 38/86  soft-tissue]
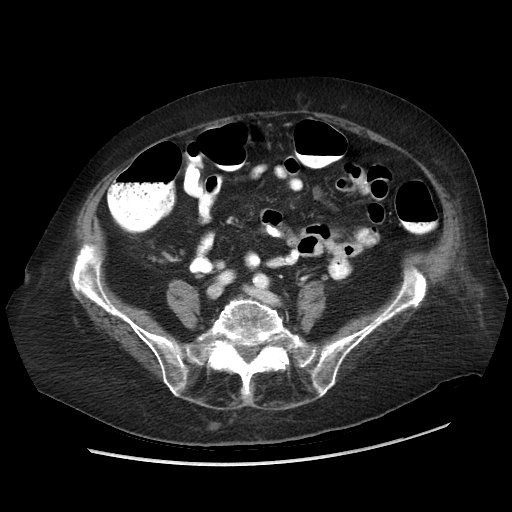
[im 48/86  soft-tissue]
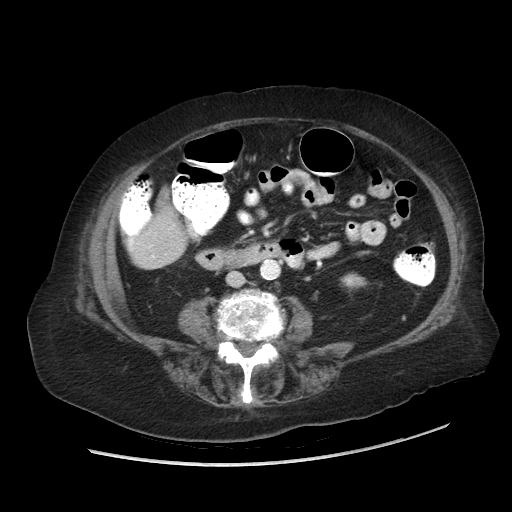
[im 48/86  lung]
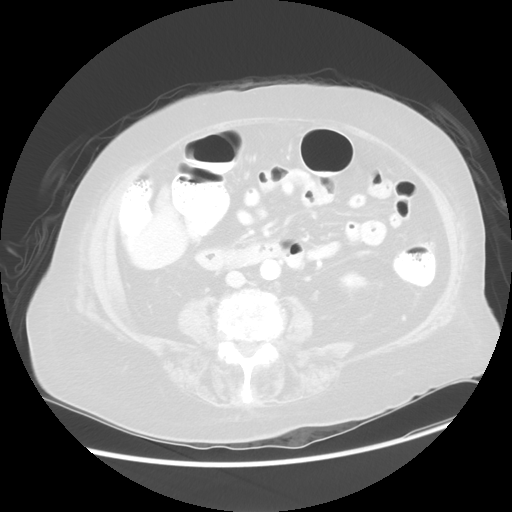
[im 57/86  soft-tissue]
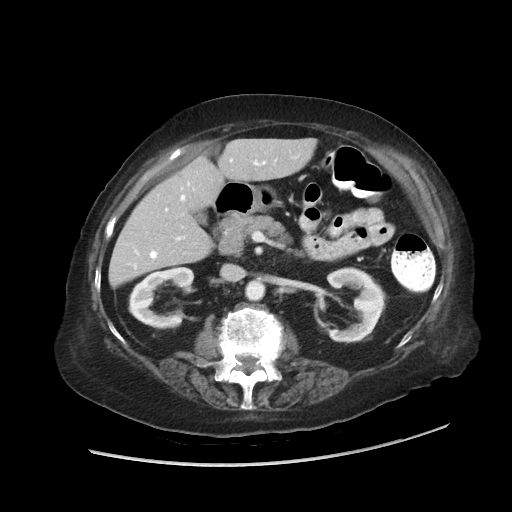
[im 57/86  lung]
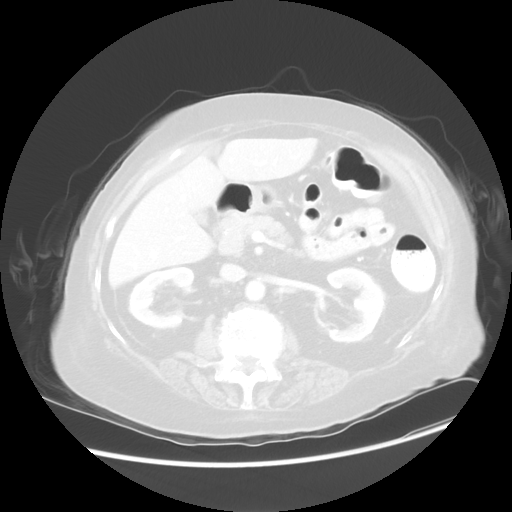
[im 67/86  soft-tissue]
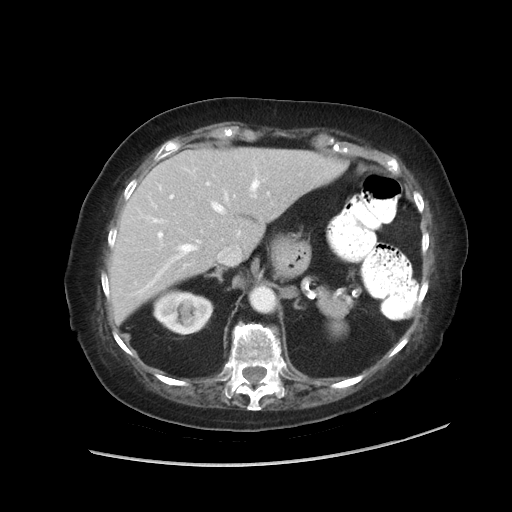
[im 67/86  lung]
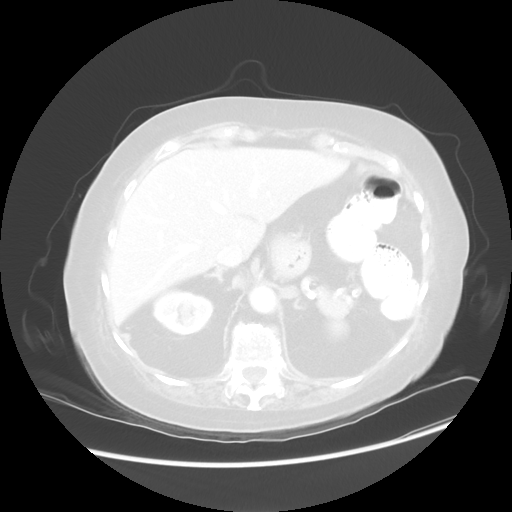
[im 76/86  soft-tissue]
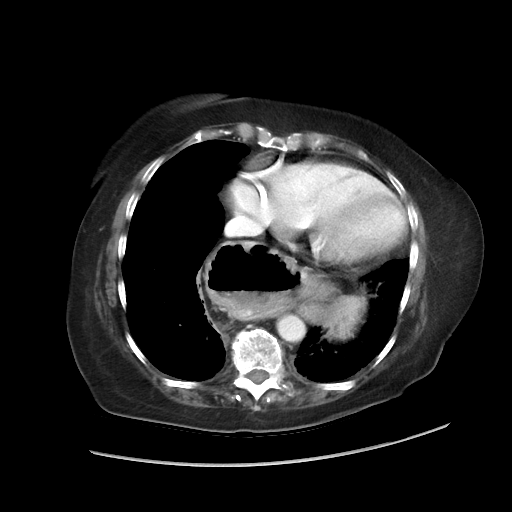
[im 76/86  lung]
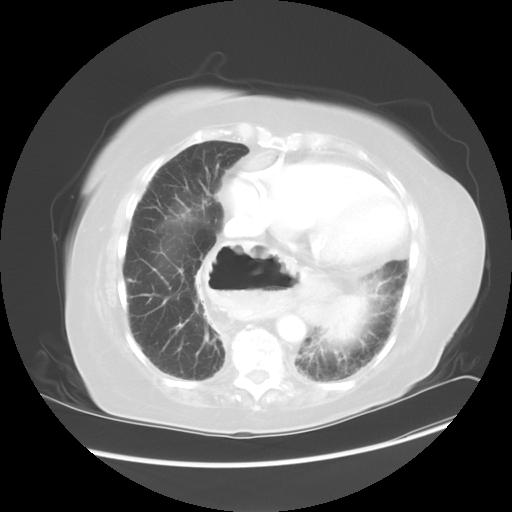

[Series 601: coronal body · coronal · 0.84mm/px · 1 of 120 slices shown, 2 images]
[im 40/120  soft-tissue]
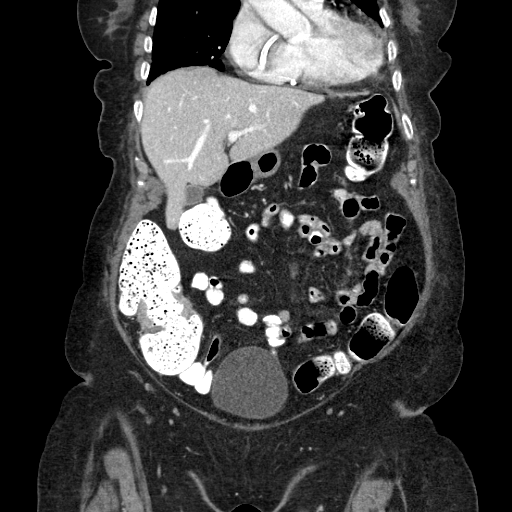
[im 40/120  bone]
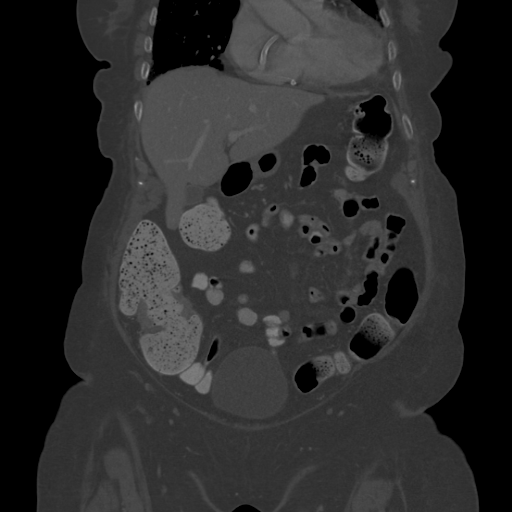

[Series 602: sagittal body · sagittal · 0.84mm/px · 3 of 160 slices shown]
[im 10/160  soft-tissue]
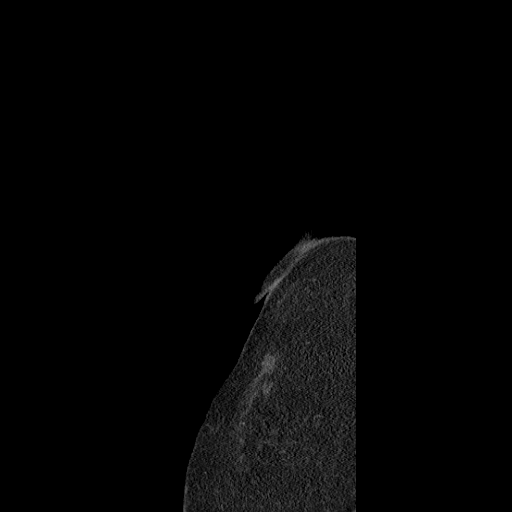
[im 30/160  soft-tissue]
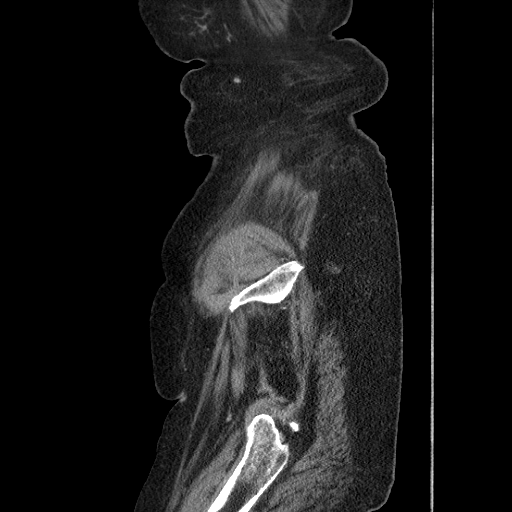
[im 50/160  soft-tissue]
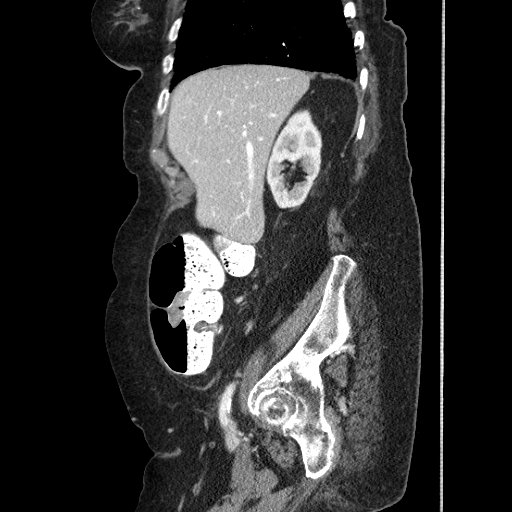

[12 of 36 positions shown; findings below may reference images not displayed]

FINDINGS: Lower chest: Scarring changes but no worrisome pulmonary nodules.
Large hiatal hernia. PE heart is mildly enlarged. Extensive coronary
artery calcifications.

Hepatobiliary: Stable small low-attenuation lesion in the dome of
the right hepatic lobe. This is likely a benign cyst. No worrisome
hepatic lesions to suggest metastatic disease. The gallbladder is
normal. No common bile duct dilatation.

Pancreas: No mass, inflammation or ductal dilatation. A small fatty
cleft is noted.

Spleen: Normal size.  No focal lesions.

Adrenals/Urinary Tract: The adrenal glands and kidneys are
unremarkable. No worrisome renal lesions or hydronephrosis. No renal
or ureteral calculi. No bladder abnormality.

Stomach/Bowel: The stomach, duodenum and small bowel are
unremarkable. A good portion of the stomach is up in the chest.

There is a circumferential soft tissue mass involving the ascending
colon just above the ileocecal valve consistent with an apple-core
type adenocarcinoma of the colon. No adjacent adenopathy.

Vascular/Lymphatic: Moderate atherosclerotic calcifications
involving the aorta but no aneurysm or dissection. The branch
vessels are patent. The major venous structures are patent. No
mesenteric or retroperitoneal mass or adenopathy.

Other: The uterus and ovaries are normal. A pessary ring is noted in
the vagina. The bladder is normal. No pelvic mass or adenopathy. No
free pelvic fluid collections. No inguinal mass or adenopathy.

Musculoskeletal: No significant bony findings. Advanced degenerative
changes involving the spine.
IMPRESSION: Circumferential apple-core type lesion involving the ascending colon
just above the ileocecal valve consistent with colon cancer.

No findings for local adenopathy or metastatic disease involving the
liver.

Large hiatal hernia.

Advanced atherosclerotic calcifications involving the aorta and
coronary arteries.

These results will be called to the ordering clinician or
representative by the Radiologist Assistant, and communication
documented in the PACS or zVision Dashboard.

## 2018-08-24 IMAGING — DX DG CHEST 1V
1 series · 1 of 1 positions shown · non-contrast
Comparison: Portable chest x-ray January 31, 2014

CLINICAL DATA: History of asthma, CHF, coronary artery disease,
former smoker. No current complaints.

EXAM:
CHEST 1 VIEW

[chest ap]
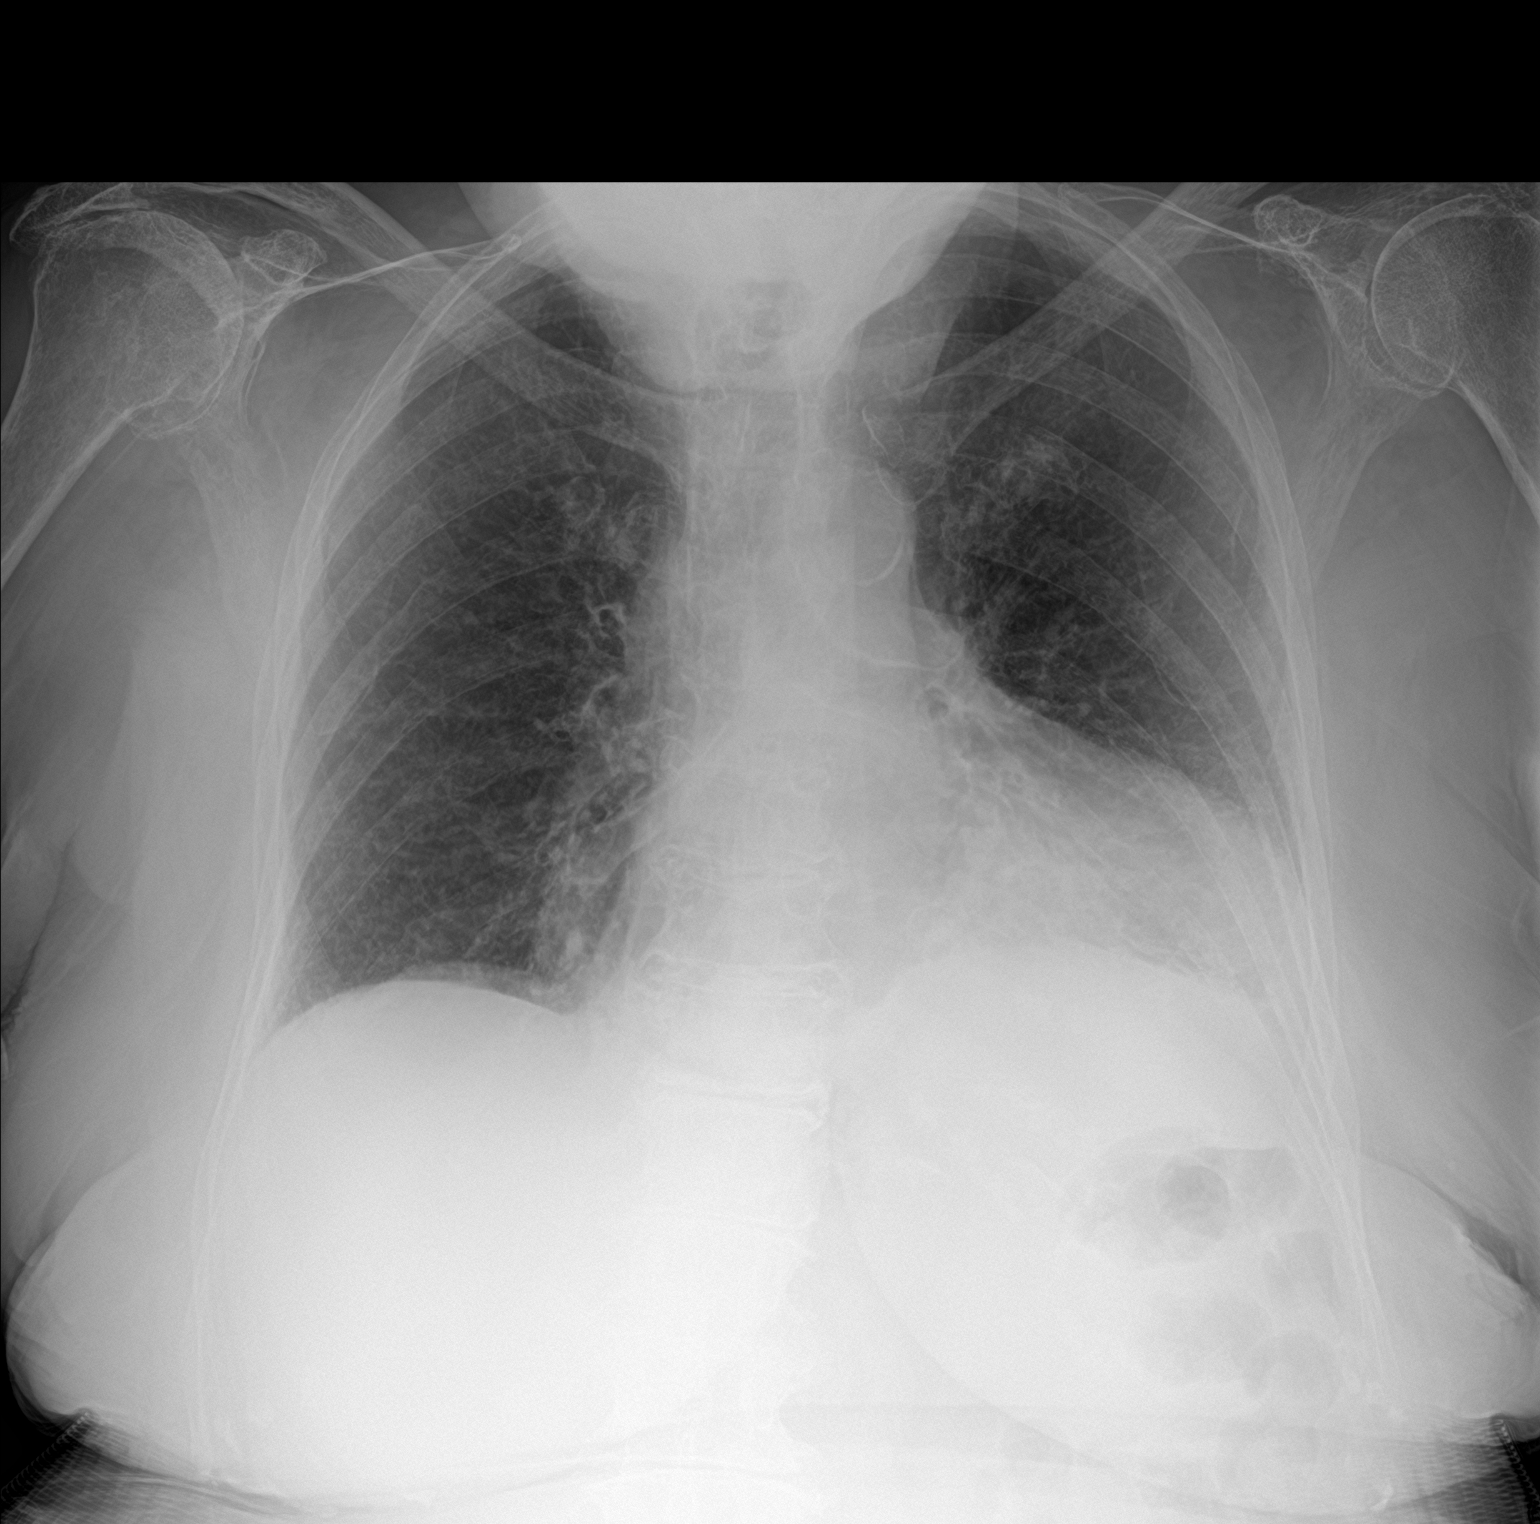

[1 of 1 positions shown; findings below may reference images not displayed]

FINDINGS: The lungs are adequately inflated. There is no focal infiltrate. The
cardiac silhouette remains enlarged. The pulmonary vascularity is
normal. There is calcification in the wall of the aortic arch. There
is multilevel degenerative disc disease of the thoracic spine. There
are degenerative changes of both shoulders.
IMPRESSION: Stable cardiomegaly.  No CHF or pneumonia.

Aortic atherosclerosis.
